# Patient Record
Sex: Female | Born: 1965 | Race: Black or African American | Hispanic: Yes | Marital: Single | Smoking: Never smoker
Health system: Southern US, Community
[De-identification: ages and names within clinical notes are randomized; demographics above are authoritative.]

## PROBLEM LIST (undated history)

## (undated) DIAGNOSIS — R079 Chest pain, unspecified: Secondary | ICD-10-CM

## (undated) DIAGNOSIS — E7800 Pure hypercholesterolemia, unspecified: Secondary | ICD-10-CM

## (undated) DIAGNOSIS — I493 Ventricular premature depolarization: Secondary | ICD-10-CM

## (undated) DIAGNOSIS — G8929 Other chronic pain: Secondary | ICD-10-CM

## (undated) DIAGNOSIS — F32A Depression, unspecified: Secondary | ICD-10-CM

## (undated) DIAGNOSIS — J45909 Unspecified asthma, uncomplicated: Secondary | ICD-10-CM

## (undated) DIAGNOSIS — J3089 Other allergic rhinitis: Secondary | ICD-10-CM

## (undated) DIAGNOSIS — E785 Hyperlipidemia, unspecified: Secondary | ICD-10-CM

## (undated) DIAGNOSIS — G894 Chronic pain syndrome: Secondary | ICD-10-CM

## (undated) HISTORY — DX: Chest pain, unspecified: R07.9

## (undated) HISTORY — PX: TUBAL LIGATION: SHX77

## (undated) HISTORY — DX: Depression, unspecified: F32.A

## (undated) HISTORY — DX: Ventricular premature depolarization: I49.3

## (undated) HISTORY — DX: Chronic pain syndrome: G89.4

## (undated) HISTORY — DX: Other allergic rhinitis: J30.89

---

## 2004-12-01 ENCOUNTER — Emergency Department (HOSPITAL_COMMUNITY): Admission: EM | Admit: 2004-12-01 | Discharge: 2004-12-02 | Payer: Self-pay | Admitting: Emergency Medicine

## 2005-01-15 ENCOUNTER — Inpatient Hospital Stay (HOSPITAL_COMMUNITY): Admission: AD | Admit: 2005-01-15 | Discharge: 2005-01-15 | Payer: Self-pay | Admitting: Obstetrics

## 2005-01-23 ENCOUNTER — Inpatient Hospital Stay (HOSPITAL_COMMUNITY): Admission: AD | Admit: 2005-01-23 | Discharge: 2005-01-23 | Payer: Self-pay | Admitting: Obstetrics

## 2005-05-26 ENCOUNTER — Inpatient Hospital Stay (HOSPITAL_COMMUNITY): Admission: AD | Admit: 2005-05-26 | Discharge: 2005-05-26 | Payer: Self-pay | Admitting: Obstetrics

## 2005-07-19 ENCOUNTER — Inpatient Hospital Stay (HOSPITAL_COMMUNITY): Admission: AD | Admit: 2005-07-19 | Discharge: 2005-07-21 | Payer: Self-pay | Admitting: Obstetrics

## 2005-07-20 ENCOUNTER — Encounter (INDEPENDENT_AMBULATORY_CARE_PROVIDER_SITE_OTHER): Payer: Self-pay | Admitting: Specialist

## 2005-07-20 HISTORY — PX: TUBAL LIGATION: SHX77

## 2005-11-14 ENCOUNTER — Emergency Department (HOSPITAL_COMMUNITY): Admission: EM | Admit: 2005-11-14 | Discharge: 2005-11-14 | Payer: Self-pay | Admitting: Emergency Medicine

## 2007-11-23 ENCOUNTER — Emergency Department (HOSPITAL_COMMUNITY): Admission: EM | Admit: 2007-11-23 | Discharge: 2007-11-24 | Payer: Self-pay | Admitting: Emergency Medicine

## 2009-07-16 ENCOUNTER — Emergency Department (HOSPITAL_COMMUNITY): Admission: EM | Admit: 2009-07-16 | Discharge: 2009-07-17 | Payer: Self-pay | Admitting: Emergency Medicine

## 2010-08-14 ENCOUNTER — Other Ambulatory Visit (HOSPITAL_COMMUNITY): Payer: Self-pay | Admitting: Family Medicine

## 2010-08-14 ENCOUNTER — Other Ambulatory Visit (HOSPITAL_BASED_OUTPATIENT_CLINIC_OR_DEPARTMENT_OTHER): Payer: Self-pay | Admitting: Family Medicine

## 2010-08-14 DIAGNOSIS — Z1231 Encounter for screening mammogram for malignant neoplasm of breast: Secondary | ICD-10-CM

## 2010-08-21 ENCOUNTER — Ambulatory Visit (HOSPITAL_COMMUNITY)
Admission: RE | Admit: 2010-08-21 | Discharge: 2010-08-21 | Disposition: A | Discharge status: Home / Self Care | Payer: Self-pay | Source: Ambulatory Visit | Attending: Family Medicine | Admitting: Family Medicine

## 2010-08-21 DIAGNOSIS — Z1231 Encounter for screening mammogram for malignant neoplasm of breast: Secondary | ICD-10-CM | POA: Insufficient documentation

## 2010-10-31 NOTE — Op Note (Signed)
NAME:  Felicia Ray            ACCOUNT NO.:  [id number]   MEDICAL RECORD NO.:  [id number]          PATIENT TYPE:  INP   LOCATION:  9148                          FACILITY:  [facility]   PHYSICIAN:  Kathreen Cosier, M.D.DATE OF BIRTH:  May 18, 1966   DATE OF PROCEDURE:  07/20/2005  DATE OF DISCHARGE:                                 OPERATIVE REPORT   PROCEDURE:  Multiparity.   PROCEDURE:  Postpartum tubal ligation.   Under general anesthesia, patient in supine position, abdomen prepped and  draped, bladder emptied by a straight catheter.  A midline subumbilical  incision one inch long was made, carried down to the fascia, the fascia  cleaned, grasped with two Kochers, and the fascia and the peritoneum opened  with the Mayo scissors.  The left tube was grasped in the midportion with a  Babcock clamp and the tube traced to the fimbriae.  A 0 plain suture was  placed in the mesosalpinx below the portion of tube in the clamp.  This was  tied and then approximately one inch of tube transected.  Hemostasis was  satisfactory.  On the right side the tube was bound down with adhesions and  the fimbriae were not identified.  However, the tube was identified to its  entrance into the uterus.  The tube was grasped with a Kelly clamp and a 0  plain tie placed around the portion of the tube that had been clamped and a  0 plain suture placed on the mesosalpinx below the portion of tube in the  clamp, and approximately one inch of tube transected.  Hemostasis was  satisfactory.  Lap and sponge count was correct.  Abdomen closed in layers,  the peritoneum and fascia with a continuous suture of 0 Dexon, the skin  closed with subcuticular suture of 4-0 Monocryl.  Blood loss minimal.  The  patient tolerated the procedure well, taken to the recovery room in good  condition.           ______________________________  Kathreen Cosier, M.D.     BAM/MEDQ  D:  07/20/2005  T:  07/20/2005  Job:   [id number]

## 2011-07-30 ENCOUNTER — Encounter (HOSPITAL_COMMUNITY): Payer: Self-pay | Admitting: *Deleted

## 2011-07-30 ENCOUNTER — Observation Stay (HOSPITAL_COMMUNITY)
Admission: EM | Admit: 2011-07-30 | Discharge: 2011-07-31 | Disposition: A | Discharge status: Home / Self Care | Payer: Self-pay | Attending: Emergency Medicine | Admitting: Emergency Medicine

## 2011-07-30 DIAGNOSIS — T782XXA Anaphylactic shock, unspecified, initial encounter: Secondary | ICD-10-CM

## 2011-07-30 DIAGNOSIS — T7800XA Anaphylactic reaction due to unspecified food, initial encounter: Principal | ICD-10-CM | POA: Insufficient documentation

## 2011-07-30 DIAGNOSIS — T7840XA Allergy, unspecified, initial encounter: Secondary | ICD-10-CM

## 2011-07-30 HISTORY — DX: Pure hypercholesterolemia, unspecified: E78.00

## 2011-07-30 MED ORDER — ONDANSETRON HCL 4 MG/2ML IJ SOLN
4.0000 mg | Freq: Once | INTRAMUSCULAR | Status: AC
  Administered 2011-07-30: 4 mg via INTRAVENOUS
  Filled 2011-07-30: qty 2

## 2011-07-30 MED ORDER — METHYLPREDNISOLONE SODIUM SUCC 125 MG IJ SOLR
125.0000 mg | Freq: Once | INTRAMUSCULAR | Status: AC
  Administered 2011-07-30: 125 mg via INTRAVENOUS
  Filled 2011-07-30: qty 2

## 2011-07-30 MED ORDER — EPINEPHRINE 0.3 MG/0.3ML IJ DEVI
0.3000 mg | Freq: Once | INTRAMUSCULAR | Status: AC
  Administered 2011-07-30: 0.3 mg via INTRAMUSCULAR

## 2011-07-30 MED ORDER — SODIUM CHLORIDE 0.9 % IV BOLUS (SEPSIS)
1000.0000 mL | Freq: Once | INTRAVENOUS | Status: AC
  Administered 2011-07-30: 1000 mL via INTRAVENOUS

## 2011-07-30 MED ORDER — RANITIDINE HCL 150 MG/10ML PO SYRP
150.0000 mg | ORAL_SOLUTION | Freq: Once | ORAL | Status: DC
  Filled 2011-07-30: qty 10

## 2011-07-30 MED ORDER — EPINEPHRINE 0.3 MG/0.3ML IJ DEVI
INTRAMUSCULAR | Status: AC
  Filled 2011-07-30: qty 0.3

## 2011-07-30 MED ORDER — FAMOTIDINE IN NACL 20-0.9 MG/50ML-% IV SOLN
20.0000 mg | Freq: Once | INTRAVENOUS | Status: AC
  Administered 2011-07-30: 20 mg via INTRAVENOUS
  Filled 2011-07-30: qty 50

## 2011-07-30 NOTE — ED Notes (Signed)
Friend explained to the patient that she was going to stay in the hospital tonight for observation.

## 2011-07-30 NOTE — ED Notes (Signed)
Patient ate at [employer] pizza about 7pm  Approx 730pm she started having difficulty breathing, breaking out in a rash.  Friend brought her into the hospital

## 2011-07-30 NOTE — ED Notes (Signed)
Patient states she is feeling better only tired.  Explained that she could be tired due to the Benadryl 50mg  taken at home.  Rash has decreased

## 2011-07-30 NOTE — ED Notes (Signed)
Unable to triage pt. Due to vomiting.  Pt st's she ate pizza then started having diff. Breathing and rash.  Charge nurse Barbara Cower, RN made aware, pt taken to room 4.

## 2011-07-30 NOTE — ED Provider Notes (Signed)
History     CSN: [id number]  Arrival date & time 07/30/11  2102   None     Chief Complaint  Patient presents with  . Allergic Reaction    (Consider location/radiation/quality/duration/timing/severity/associated sxs/prior treatment) HPI Hx provided by the pt.  Arrived via POV.  46 y.o. F presenting with allergic rxn.  Pt reportedly ate at [employer] around 7pm and developed a relatively acute rash around 7:30pm which is diffuse, red, and min pruritis.  Pt also with associated N/V.  Similar Sx's with ASA ingestion and unknown other food ingestion.  Pt took benadryl 50mg  PO PTA.  No associated SOB, wheezing, or swelling of face/lips/tongue/throat.  Sx's described as constant and moderate to severe.  Pt has not been seen recently for his current complaints.    No past medical history on file.  No past surgical history on file.  No family history on file.  History  Substance Use Topics  . Smoking status: Not on file  . Smokeless tobacco: Not on file  . Alcohol Use: Not on file    OB History    No data available      Review of Systems  Constitutional: Negative for fever and chills.  HENT: Negative for congestion, sore throat and rhinorrhea.   Eyes: Negative for pain and visual disturbance.  Respiratory: Negative for cough, shortness of breath and wheezing.   Cardiovascular: Negative for chest pain and palpitations.  Gastrointestinal: Positive for nausea and vomiting. Negative for abdominal pain, diarrhea and blood in stool.  Genitourinary: Negative for dysuria and hematuria.  Musculoskeletal: Negative for back pain and gait problem.  Skin: Positive for rash. Negative for wound.  Neurological: Negative for dizziness and headaches.  Psychiatric/Behavioral: Negative for confusion and agitation.  All other systems reviewed and are negative.    Allergies  Aspirin and Tylenol  Home Medications   Current Outpatient Rx  Name Route Sig Dispense Refill  .  DIPHENHYDRAMINE HCL 25 MG PO TABS Oral Take 50 mg by mouth every 6 (six) hours as needed. For allergic reaction    . OMEGA-3 FATTY ACIDS 1000 MG PO CAPS Oral Take 1 g by mouth 2 (two) times daily.    Marland Kitchen NIACIN 500 MG PO TABS Oral Take 500 mg by mouth daily with breakfast.      There were no vitals taken for this visit.  Physical Exam  Nursing note and vitals reviewed. Constitutional: She is oriented to person, place, and time. She appears well-developed and well-nourished. She appears distressed (mildly anxious).       Vomitus in basin on exam  HENT:  Head: Normocephalic and atraumatic.  Right Ear: External ear normal.  Left Ear: External ear normal.  Nose: Nose normal.  Mouth/Throat: Oropharynx is clear and moist.  Eyes: Conjunctivae and EOM are normal. Pupils are equal, round, and reactive to light.  Neck: Normal range of motion. Neck supple.  Cardiovascular: Regular rhythm and intact distal pulses.   No murmur heard.      Borderline tachy  Pulmonary/Chest: Effort normal and breath sounds normal. No respiratory distress.  Abdominal: Soft. Bowel sounds are normal. There is no tenderness.  Musculoskeletal: Normal range of motion. She exhibits no edema.  Neurological: She is alert and oriented to person, place, and time.  Skin: Skin is warm and dry. Rash (diffuse erythematous rash) noted. She is not diaphoretic.  Psychiatric: Judgment normal. Her mood appears anxious.    ED Course  Procedures (including critical care time)  Labs Reviewed -  No data to display No results found.   1. Allergic reaction   2. Anaphylaxis       MDM  46 y.o. F with allergic rxn after eating at [employer] with diffuse rash and N/V c/f anaphylaxis without airway/breathing involvement.  Benadryl taken PTA.  Epi IM, pepsid, zofran, and solumedrol ordered with improved Sx's.  Pt placed in CDU obs with plan for d/c with epi pen after 6-8 hr obs.    Care transferred to Dr. Bebe Shaggy.        Particia Lather,  MD 07/31/11 [phone]

## 2011-07-30 NOTE — ED Provider Notes (Signed)
46 year old, female, presents to emergency department after she had an allergic reaction: Being eats and this evening.  She has had pizza in the past, never had a similar reaction to eating pizza, however, she's had similar allergic reactions in the past.  Her reaction was characterized by diffuse, erythematous rash with abdominal pain, that she describes as burning, as well as nausea and vomiting.  Following treatment in the emergency department.  She still has moderate amount of erythema, and a burning sensation in her abdomen.  Presently, her lungs are clear to auscultation and she is not in respiratory distress.  We will continue to monitor her for several hours for possibility of recurrent anaphylactic reaction.  Nicholes Stairs, MD 07/30/11 [phone]

## 2011-07-31 MED ORDER — PREDNISONE 50 MG PO TABS: ORAL_TABLET | ORAL | Status: AC

## 2011-07-31 MED ORDER — EPINEPHRINE 0.3 MG/0.3ML IJ DEVI: 0.3000 mg | Freq: Once | INTRAMUSCULAR | Status: DC

## 2011-07-31 NOTE — ED Notes (Signed)
Patient up and to restroom. Waiting discharge.

## 2011-07-31 NOTE — ED Provider Notes (Signed)
Pt seen in the AM, well appearing, no distress Lungs sounds clear No angioedema She is sleeping/resting comfortably Stable for d/c  Joya Gaskins, MD 07/31/11 [phone]

## 2011-07-31 NOTE — ED Provider Notes (Signed)
I saw and evaluated the patient, reviewed the resident's note and I agree with the findings and plan.  Ozelle Brubacher P Mihika Surrette, MD 07/31/11 1514 

## 2011-07-31 NOTE — ED Notes (Signed)
Ordered regular bfast per nurse

## 2011-07-31 NOTE — Discharge Instructions (Signed)
Reaccin anafilctica (Anaphylactic Reaction) La reaccin anafilctica es una reaccin alrgica grave. La causa pueden ser ciertos medicamentos, alimentos, picaduras de insectos u otras sustancias habituales. No puede contagiarse de Neomia Dear persona a [place]. Puede poner en peligro la vida y requiere hospitalizacin.  SNTOMAS Algunos de los sntomas pueden ser:  Erupcin cutnea o urticaria.   Picazn   Opresin en el pecho.   Inchazn (que [employer] ojos, labios, [place]).   Problemas para respirar o tragar.   Mareos o Health Net de [place] y vmitos.  Los sntomas pueden desaparecer gradualmente cuando haya eliminado la sustancia que caus el problema. En algunos casos es necesario que transcurran dos o [place] para que los sntomas desaparezcan completamente. INSTRUCCIONES PARA EL CUIDADO DOMICILIARIO  Use un brazalete o lleve una tarjeta que enumere las cosas que le han causado una reaccin anafilctica o una reaccin alrgica menos grave.   Si en el pasado tuvo problemas con uno o ms medicamentos, debe evitarlos en el futuro, ya sean los mismos o medicamentos similares.   Hable con su mdico antes de utilizar nuevos medicamentos de venta libre o prescriptos.   Si presenta sarpullido o erupcin cutnea:   Aplquese compresas fras sobre la piel o tome baos de agua fra para Associate Professor.   Evite los baos o las duchas calientes. Esto puede hacer que la picazn empeore.   Use ropas sueltas.   Si ha sufrido Runner, broadcasting/film/video grave en el pasado:   Lleve con usted un kit anafilctico o epi-pen en todo momento. Tanto usted [employer] de su familia deben aprender a Chemical engineer los medicamentos del kit. Esto podr salvarle la vida si tiene una reaccin grave. Si [employer], y los sntomas mejoran, es importante que busque atencin mdica de inmediato o llame al servicio de urgencias de su localidad (911 en los [place]).   Los sntomas graves pueden  aparecer nuevamente cuando elimine los medicamentos que le han aplicado del kit.   Si no fue necesaria la hospitalizacin, debe tener rpido acceso a un servicio de emergencias por si el problema aparece nuevamente. Si un familiar o amigo ha aprendido a Oceanographer del kit anafilctico y Insurance claims handler con usted, tambin podr administrrselos si el problema ocurre nuevamente.   Asegrese de renovar el kit anafilctico cuando haya pasado la fecha de vencimiento.   Puede retornar a sus actividades normales una vez que los sntomas de la alergia hayan desaparecido.  BUSQUE ATENCIN MDICA SI:  Tiene sntomas de reaccin alrgica a una nueva sustancia. Los sntomas pueden aparecer inmediatamente o minutos despus.   Aparece una erupcin cutnea, picazn o sarpullido.   Tiene sntomas diferentes a los que sufri previamente.  SOLICITE ATENCIN MDICA DE INMEDIATO SI:   Presenta dificultad para respirar, jadea o tiene una sensacin de opresin en el pecho o en la garganta.   Se le hincha la boca o la [place], parte del cuerpo o tiene picazn generalizada.   Dolor de Teaching laboratory technician y vmitos.   Se marea o pierde el conocimiento   Siente dolor en el peco o empeoran los problemas enumerados. ESTO ES UNA EMERGENCIA. Llame al Clarita Leber de emergencias (911 en [place]).  ASEGURESE QUE:   Comprende estas instrucciones.   Controlar su enfermedad.   Solicitar ayuda de inmediato si no mejora o si empeora.  Document Released: 06/01/2005 Document Revised: 02/11/2011 [facility] Patient Information 2012 [place], [place].Reaccin anafilctica (Anaphylactic Reaction) La reaccin anafilctica es una reaccin alrgica  grave. La causa pueden ser ciertos medicamentos, alimentos, picaduras de insectos u otras sustancias habituales. No puede contagiarse de Neomia Dear persona a [place]. Puede poner en peligro la vida y requiere hospitalizacin.  SNTOMAS Algunos de los sntomas pueden ser:  Erupcin  cutnea o urticaria.   Picazn   Opresin en el pecho.   Inchazn (que [employer] ojos, labios, [place]).   Problemas para respirar o tragar.   Mareos o Health Net de [place] y vmitos.  Los sntomas pueden desaparecer gradualmente cuando haya eliminado la sustancia que caus el problema. En algunos casos es necesario que transcurran dos o [place] para que los sntomas desaparezcan completamente. INSTRUCCIONES PARA EL CUIDADO DOMICILIARIO  Use un brazalete o lleve una tarjeta que enumere las cosas que le han causado una reaccin anafilctica o una reaccin alrgica menos grave.   Si en el pasado tuvo problemas con uno o ms medicamentos, debe evitarlos en el futuro, ya sean los mismos o medicamentos similares.   Hable con su mdico antes de utilizar nuevos medicamentos de venta libre o prescriptos.   Si presenta sarpullido o erupcin cutnea:   Aplquese compresas fras sobre la piel o tome baos de agua fra para Associate Professor.   Evite los baos o las duchas calientes. Esto puede hacer que la picazn empeore.   Use ropas sueltas.   Si ha sufrido Runner, broadcasting/film/video grave en el pasado:   Lleve con usted un kit anafilctico o epi-pen en todo momento. Tanto usted [employer] de su familia deben aprender a Chemical engineer los medicamentos del kit. Esto podr salvarle la vida si tiene una reaccin grave. Si [employer], y los sntomas mejoran, es importante que busque atencin mdica de inmediato o llame al servicio de urgencias de su localidad (911 en los [place]).   Los sntomas graves pueden aparecer nuevamente cuando elimine los medicamentos que le han aplicado del kit.   Si no fue necesaria la hospitalizacin, debe tener rpido acceso a un servicio de emergencias por si el problema aparece nuevamente. Si un familiar o amigo ha aprendido a Oceanographer del kit anafilctico y Insurance claims handler con usted, tambin podr administrrselos si el  problema ocurre nuevamente.   Asegrese de renovar el kit anafilctico cuando haya pasado la fecha de vencimiento.   Puede retornar a sus actividades normales una vez que los sntomas de la alergia hayan desaparecido.  BUSQUE ATENCIN MDICA SI:  Tiene sntomas de reaccin alrgica a una nueva sustancia. Los sntomas pueden aparecer inmediatamente o minutos despus.   Aparece una erupcin cutnea, picazn o sarpullido.   Tiene sntomas diferentes a los que sufri previamente.  SOLICITE ATENCIN MDICA DE INMEDIATO SI:   Presenta dificultad para respirar, jadea o tiene una sensacin de opresin en el pecho o en la garganta.   Se le hincha la boca o la [place], parte del cuerpo o tiene picazn generalizada.   Dolor de Teaching laboratory technician y vmitos.   Se marea o pierde el conocimiento   Siente dolor en el peco o empeoran los problemas enumerados. ESTO ES UNA EMERGENCIA. Llame al Clarita Leber de emergencias (911 en [place]).  ASEGURESE QUE:   Comprende estas instrucciones.   Controlar su enfermedad.   Solicitar ayuda de inmediato si no mejora o si empeora.  Document Released: 06/01/2005 Document Revised: 02/11/2011 [facility] Patient Information 2012 [place], [place].

## 2012-03-04 ENCOUNTER — Emergency Department (HOSPITAL_COMMUNITY)
Admission: EM | Admit: 2012-03-04 | Discharge: 2012-03-04 | Disposition: A | Discharge status: Home / Self Care | Payer: Self-pay | Attending: Emergency Medicine | Admitting: Emergency Medicine

## 2012-03-04 DIAGNOSIS — R21 Rash and other nonspecific skin eruption: Secondary | ICD-10-CM | POA: Insufficient documentation

## 2012-03-04 DIAGNOSIS — T50995A Adverse effect of other drugs, medicaments and biological substances, initial encounter: Secondary | ICD-10-CM | POA: Insufficient documentation

## 2012-03-04 DIAGNOSIS — T782XXA Anaphylactic shock, unspecified, initial encounter: Secondary | ICD-10-CM | POA: Insufficient documentation

## 2012-03-04 LAB — CBC WITH DIFFERENTIAL/PLATELET
Eosinophils Absolute: 0 10*3/uL (ref 0.0–0.7)
Eosinophils Relative: 0 % (ref 0–5)
HCT: 43.2 % (ref 36.0–46.0)
Hemoglobin: 15.2 g/dL — ABNORMAL HIGH (ref 12.0–15.0)
Lymphocytes Relative: 5 % — ABNORMAL LOW (ref 12–46)
Lymphs Abs: 0.9 10*3/uL (ref 0.7–4.0)
MCH: 30.5 pg (ref 26.0–34.0)
MCV: 86.7 fL (ref 78.0–100.0)
Monocytes Absolute: 0.2 10*3/uL (ref 0.1–1.0)
Monocytes Relative: 1 % — ABNORMAL LOW (ref 3–12)
Platelets: 215 10*3/uL (ref 150–400)
RBC: 4.98 MIL/uL (ref 3.87–5.11)
WBC: 16.5 10*3/uL — ABNORMAL HIGH (ref 4.0–10.5)

## 2012-03-04 LAB — BASIC METABOLIC PANEL
BUN: 14 mg/dL (ref 6–23)
CO2: 20 mEq/L (ref 19–32)
GFR calc non Af Amer: >90 mL/min (ref >90)
Glucose, Bld: 151 mg/dL — ABNORMAL HIGH (ref 70–99)
Potassium: 3.3 mEq/L — ABNORMAL LOW (ref 3.5–5.1)
Sodium: 140 mEq/L (ref 135–145)

## 2012-03-04 LAB — URINALYSIS, ROUTINE W REFLEX MICROSCOPIC
Bilirubin Urine: NEGATIVE
Glucose, UA: NEGATIVE mg/dL
Hgb urine dipstick: NEGATIVE
Specific Gravity, Urine: 1.006 (ref 1.005–1.030)
Urobilinogen, UA: 0.2 mg/dL (ref 0.0–1.0)

## 2012-03-04 MED ORDER — METHYLPREDNISOLONE SODIUM SUCC 125 MG IJ SOLR
INTRAMUSCULAR | Status: AC
  Filled 2012-03-04: qty 2

## 2012-03-04 MED ORDER — POTASSIUM CHLORIDE CRYS ER 20 MEQ PO TBCR
40.0000 meq | EXTENDED_RELEASE_TABLET | Freq: Once | ORAL | Status: AC
  Administered 2012-03-04: 40 meq via ORAL
  Filled 2012-03-04: qty 2

## 2012-03-04 MED ORDER — METHYLPREDNISOLONE SODIUM SUCC 125 MG IJ SOLR
125.0000 mg | Freq: Once | INTRAMUSCULAR | Status: AC
  Administered 2012-03-04: 125 mg via INTRAVENOUS

## 2012-03-04 MED ORDER — SODIUM CHLORIDE 0.9 % IV BOLUS (SEPSIS)
1000.0000 mL | Freq: Once | INTRAVENOUS | Status: AC
  Administered 2012-03-04: 1000 mL via INTRAVENOUS

## 2012-03-04 MED ORDER — EPINEPHRINE 0.3 MG/0.3ML IJ DEVI: 0.3000 mg | INTRAMUSCULAR | Status: DC | PRN

## 2012-03-04 MED ORDER — EPINEPHRINE 0.3 MG/0.3ML IJ DEVI
INTRAMUSCULAR | Status: AC
  Filled 2012-03-04: qty 0.3

## 2012-03-04 MED ORDER — DIPHENHYDRAMINE HCL 50 MG/ML IJ SOLN
50.0000 mg | Freq: Once | INTRAMUSCULAR | Status: AC
  Administered 2012-03-04: 50 mg via INTRAVENOUS

## 2012-03-04 MED ORDER — EPINEPHRINE 0.3 MG/0.3ML IJ DEVI
0.3000 mg | Freq: Once | INTRAMUSCULAR | Status: AC
  Administered 2012-03-04: 0.3 mg via INTRAMUSCULAR

## 2012-03-04 MED ORDER — FAMOTIDINE IN NACL 20-0.9 MG/50ML-% IV SOLN
20.0000 mg | Freq: Once | INTRAVENOUS | Status: AC
  Administered 2012-03-04: 20 mg via INTRAVENOUS
  Filled 2012-03-04: qty 50

## 2012-03-04 MED ORDER — DIPHENHYDRAMINE HCL 50 MG/ML IJ SOLN
INTRAMUSCULAR | Status: AC
  Filled 2012-03-04: qty 1

## 2012-03-04 NOTE — ED Notes (Signed)
Pt denies any pain, pt and family denies any questions upon discharge.

## 2012-03-04 NOTE — ED Provider Notes (Signed)
Received sign out from Dr. Rennis Chris at 5pm. She came for severe allergic reaction today. She was given epi and observed for 7 hours. She was hypotensive initially after epi but her BP at discharge was 101/67. Her labs showed WBC 16, nl BMP, nl UA. Elevated WBC likely stress related as she remains afebrile and no signs of infection. She felt well after 7 hours and rash resolved and no respiratory symptoms. Discharge instruction given and I gave her another epi pen.   Richardean Canal, MD 03/04/12 2146

## 2012-03-04 NOTE — ED Notes (Signed)
Brought to ED via POV for eval of SOB and redness to body since eating beef per family member. Resp distress noted. Pt brought straight to trauma room. ED staff and EDP in for eval.

## 2012-03-04 NOTE — ED Provider Notes (Signed)
History     CSN: [id number]  Arrival date & time 03/04/12  1434   First MD Initiated Contact with Patient 03/04/12 1450      Chief Complaint  Patient presents with  . Allergic Reaction    (Consider location/radiation/quality/duration/timing/severity/associated sxs/prior treatment) HPI Seen on arrival. Level V caveat unstable vital signs. History is obtained from husband. Patient developed shortness of breath and diffuse redness to her body after takingFlanex for back pain earlier today. The treatment prior to coming here no other complaint No past medical history on file. Multiple allergic reactions No past surgical history on file.  No family history on file.  History  Substance Use Topics  . Smoking status: Not on file  . Smokeless tobacco: Not on file  . Alcohol Use: Not on file   No tobacco no alcohol no drugsOB History    No data available      Review of Systems  Unable to perform ROS: Unstable vital signs  Respiratory: Positive for shortness of breath.   Skin: Positive for rash.    Allergies  Other  Home Medications  No current outpatient prescriptions on file.  BP 100/58  Pulse 74  Temp 97.3 F (36.3 C) (Oral)  Resp 23  SpO2 97%  Physical Exam  Nursing note and vitals reviewed. Constitutional: She appears well-developed and well-nourished. She appears distressed.       Appears uncomfortable, air hungry  HENT:  Head: Normocephalic and atraumatic.       No mucosal lesion  Eyes: Conjunctivae normal are normal. Pupils are equal, round, and reactive to light.  Neck: Neck supple. No tracheal deviation present. No thyromegaly present.  Cardiovascular: Normal rate and regular rhythm.   No murmur heard. Pulmonary/Chest: Breath sounds normal.       Tachypnea  Abdominal: Soft. Bowel sounds are normal. She exhibits no distension. There is no tenderness.  Musculoskeletal: Normal range of motion. She exhibits no edema and no tenderness.  Neurological: She  is alert. Coordination normal.  Skin: Skin is warm and dry. Rash noted.       Diffuse urticarial rash  Psychiatric: She has a normal mood and affect.    ED Course  Procedures (including critical care time)  Labs Reviewed - No data to display No results found. Received epinephrine 0.3 mg IM, Solu-Medrol 125 mg IV and Benadryl 50 mg IV, ordered immediately upon arrival. At 3:10 PM patient appeared to be dyspneic and noted be hypotensive with blood pressure 85/50. A saline 2 L intravenous bolus ordered by me At 3:45 PM she feels much improved. Further history is obtained from [employer] using medical interpreter. Complains of a "heavy feeling in her left arm denies any chest pain. on exam rash has resolved patient is alert speaks in paragraphs is in no distress motor strength 5 over 5 overall No diagnosis found.   Date: 03/04/2012  1436  Rate: 80  Rhythm: normal sinus rhythm  QRS Axis: normal  Intervals: normal  ST/T Wave abnormalities: nonspecific T wave changes  Conduction Disutrbances:none  Narrative Interpretation:   Old EKG Reviewed: none available    Date: 03/04/2012  14:41  Rate: 80  Rhythm: normal sinus rhythm  QRS Axis: normal  Intervals: normal  ST/T Wave abnormalities: nonspecific T wave changes  Conduction Disutrbances:none  Narrative Interpretation:   Old EKG Reviewed: unchanged and    MDM  Patient felt to have allergic reaction to medication. Told to stop"flanex". She should go home with an EpiPen is  stable for discharge Signed out to   Yao at 445 pm Diagnosis anaphylactic reaction        Doug Sou, MD 03/04/12 1656

## 2012-03-04 NOTE — ED Notes (Signed)
Pt given meal tray and Po fluids per MD.

## 2012-03-04 NOTE — ED Notes (Signed)
Pt resting quietly with family at bedside, pt denies any distress. Resp e/u, skin w/d, pt is asymptomatic hypotensive. Plan of care is updated with verbal understanding and will continue to monitor pt.

## 2014-01-23 ENCOUNTER — Emergency Department (HOSPITAL_COMMUNITY)
Admission: EM | Admit: 2014-01-23 | Discharge: 2014-01-24 | Disposition: A | Discharge status: Home / Self Care | Payer: Worker's Compensation | Attending: Emergency Medicine | Admitting: Emergency Medicine

## 2014-01-23 ENCOUNTER — Encounter (HOSPITAL_COMMUNITY): Payer: Self-pay | Admitting: Emergency Medicine

## 2014-01-23 DIAGNOSIS — S99929A Unspecified injury of unspecified foot, initial encounter: Secondary | ICD-10-CM

## 2014-01-23 DIAGNOSIS — X500XXA Overexertion from strenuous movement or load, initial encounter: Secondary | ICD-10-CM | POA: Diagnosis not present

## 2014-01-23 DIAGNOSIS — S79929A Unspecified injury of unspecified thigh, initial encounter: Secondary | ICD-10-CM | POA: Insufficient documentation

## 2014-01-23 DIAGNOSIS — S79919A Unspecified injury of unspecified hip, initial encounter: Secondary | ICD-10-CM | POA: Diagnosis not present

## 2014-01-23 DIAGNOSIS — IMO0002 Reserved for concepts with insufficient information to code with codable children: Secondary | ICD-10-CM | POA: Insufficient documentation

## 2014-01-23 DIAGNOSIS — Y9389 Activity, other specified: Secondary | ICD-10-CM | POA: Insufficient documentation

## 2014-01-23 DIAGNOSIS — S99919A Unspecified injury of unspecified ankle, initial encounter: Secondary | ICD-10-CM | POA: Diagnosis present

## 2014-01-23 DIAGNOSIS — M545 Low back pain, unspecified: Secondary | ICD-10-CM

## 2014-01-23 DIAGNOSIS — Z862 Personal history of diseases of the blood and blood-forming organs and certain disorders involving the immune mechanism: Secondary | ICD-10-CM | POA: Diagnosis not present

## 2014-01-23 DIAGNOSIS — M62830 Muscle spasm of back: Secondary | ICD-10-CM

## 2014-01-23 DIAGNOSIS — J45909 Unspecified asthma, uncomplicated: Secondary | ICD-10-CM | POA: Insufficient documentation

## 2014-01-23 DIAGNOSIS — W19XXXA Unspecified fall, initial encounter: Secondary | ICD-10-CM

## 2014-01-23 DIAGNOSIS — Z8639 Personal history of other endocrine, nutritional and metabolic disease: Secondary | ICD-10-CM | POA: Insufficient documentation

## 2014-01-23 DIAGNOSIS — S8392XA Sprain of unspecified site of left knee, initial encounter: Secondary | ICD-10-CM

## 2014-01-23 DIAGNOSIS — G894 Chronic pain syndrome: Secondary | ICD-10-CM

## 2014-01-23 DIAGNOSIS — S8990XA Unspecified injury of unspecified lower leg, initial encounter: Secondary | ICD-10-CM | POA: Insufficient documentation

## 2014-01-23 DIAGNOSIS — Y9289 Other specified places as the place of occurrence of the external cause: Secondary | ICD-10-CM | POA: Diagnosis not present

## 2014-01-23 HISTORY — DX: Chronic pain syndrome: G89.4

## 2014-01-23 NOTE — ED Notes (Signed)
[facility] Expected [facility]:  Expected time:  Means of arrival:  Comments: EMS 48yo F, fall at work, c/o back pain and left knee pain

## 2014-01-23 NOTE — ED Notes (Signed)
Pt presents from work([employer]) report of a work related fall. C/o of pain on the right hip and lower back, denies LOC or head injury. Pt received 50mcg Fentanyl and 4mg  zofran en route.

## 2014-01-24 ENCOUNTER — Emergency Department (HOSPITAL_COMMUNITY): Payer: Worker's Compensation

## 2014-01-24 MED ORDER — DIAZEPAM 5 MG PO TABS: 5.0000 mg | ORAL_TABLET | Freq: Four times a day (QID) | ORAL | Status: DC | PRN

## 2014-01-24 MED ORDER — DIAZEPAM 5 MG PO TABS
ORAL_TABLET | ORAL | Status: AC
  Filled 2014-01-24: qty 1

## 2014-01-24 MED ORDER — OXYCODONE HCL 5 MG PO TABS
5.0000 mg | ORAL_TABLET | Freq: Once | ORAL | Status: DC
  Filled 2014-01-24: qty 1

## 2014-01-24 MED ORDER — OXYCODONE HCL 5 MG PO TABS: 5.0000 mg | ORAL_TABLET | ORAL | Status: DC | PRN

## 2014-01-24 NOTE — ED Notes (Signed)
Bed: [facility] Expected date:  Expected time:  Means of arrival:  Comments: 

## 2014-01-24 NOTE — Discharge Instructions (Signed)
Your x-rays today do not show any broken bones or other concerning injury. Use rest, ice, compression and elevation to reduce pain and swelling in your knee. Followup with a primary care provider or an orthopedic specialist for continued evaluation and treatment.    Esguince de rodilla ( Knee Sprain) Un esguince de rodilla es un desgarro en uno de los tejidos fuertes y fibrosos (ligamentos) que conectan los huesos de la rodilla. La gravedad del esguince depende de cunto ligamento se rompe. La ruptura puede ser parcial o completa. CAUSAS  A menudo, los esguinces son el resultado de una cada o una lesin. La fuerza del impacto hace que las fibras del ligamento se estiren ms de su largo normal. Este exceso de tensin es la causa de que las fibras del ligamento se rompan. SIGNOS Y SNTOMAS  Es posible que pierda el movimiento de la rodilla. Otros sntomas son:  [place].  Dolor en la zona de la rodilla.  Sensibilidad de la rodilla al tacto.  Hinchazn. DIAGNSTICO  Para diagnosticar un esguince de rodilla, su mdico le har un examen fsico de la rodilla. Adems, puede indicarle que se haga una radiografa de la rodilla para asegurarse de que no haya huesos fracturados. TRATAMIENTO  Si el ligamento est parcialmente roto, el tratamiento, habitualmente, consiste en mantener la rodilla en una posicin fija (inmovilizacin) o en usar un soporte durante algunas semanas cuando realice actividades que requieran movimiento. Para ello, su mdico colocar un vendaje, un yeso o una frula para impedir que la rodilla se Armed forces technical officer y para que le brinde apoyo durante los movimientos hasta que esta se cure. En el caso de un ligamento parcialmente roto, el proceso de curacin generalmente demora de 4 a 6semanas. Si el ligamento est completamente roto, segn de qu ligamento se trate, podr necesitar una ciruga para volver a unirlo al hueso o para Copywriter, advertising. Despus de la ciruga, Musician un yeso o una  frula que no podr quitarse durante 4 a 6semanas mientras el ligamento se [place]. INSTRUCCIONES PARA EL CUIDADO EN EL HOGAR  Mantenga elevada la rodilla lesionada para disminuir la hinchazn.  Para aliviar el dolor y la hinchazn, aplique hielo en la zona de la lesin:  Ponga el hielo en una bolsa plstica.  Colquese una toalla entre la piel y la bolsa de hielo.  Deje el hielo durante 20 minutos, 2 a 3 veces por da.  Tome los analgsicos nicamente como le indic su mdico.  No deje la rodilla sin proteccin hasta que el dolor y la rigidez desaparezcan (generalmente en el trmino de 4 a 6semanas).  Si tiene puesto un yeso o una frula, no deje que se moje. Si le han indicado que no puede quitrselo, cbralo con una bolsa plstica para darse [place] ducha o un bao. No practique natacin.  Su mdico puede indicarle ejercicios para que haga durante la recuperacin, a fin de Agricultural engineer la debilidad y la rigidez permanentes. SOLICITE ATENCIN MDICA DE INMEDIATO SI:  El yeso o la frula se daan.  El dolor [place].  Tiene dolor intenso, hinchazn o adormecimiento debajo del yeso o la frula. ASEGRESE DE QUE:  Comprende estas instrucciones.  Controlar su afeccin.  Recibir ayuda de inmediato si no mejora o si empeora. Document Released: 06/01/2005 Document Revised: 03/22/2013 [facility] Patient Information 2015 [place], [place]. This information is not intended to replace advice given to you by your health care provider. Make sure you discuss any questions you have with your health care provider.  Dolor de espalda en el adulto (Back Pain, Adult)  El dolor de cintura es frecuente. Aproximadamente 1 de cada 5 personas lo sufren.La causa rara vez pone en peligro la vida. Con frecuencia mejora luego de algn tiempo.Alrededor de la mitad de las personas que sufren un inicio sbito de dolor de cintura, se sentirn mejor luego de 2 semanas. Aproximadamente 8 de cada 10 se sentirn  mejor luego de 6 semanas.  CAUSAS  Algunas causas comunes son:   Distensin de los msculos o ligamentos que sostienen la columna vertebral.  Desgaste (degeneracin) de los discos vertebrales.  Artritis.  Traumatismos directos en la espalda. DIAGNSTICO  La mayor parte de las veces, la causa directa no se conoce.Sin embargo, Chief Technology Officer puede tratarse efectivamente an cuando no se Best boy.Una de las formas ms precisas de asegurar que la causa del dolor no constituye un peligro es responder a las preguntas del mdico acerca de su salud y sus sntomas. Si el mdico necesita ms informacin, podr indicar anlisis de laboratorio o Education officer, environmental un diagnstico por imgenes (radiografas o Health visitor).Sin embargo, aunque las [employer] modificaciones, generalmente no es necesaria la [place].  INSTRUCCIONES PARA EL CUIDADO EN EL HOGAR  En algunas personas, el dolor de espalda vuelve.Como rara vez es peligroso, los pacientes pueden aprender a Interior and spatial designer.   Mantngase activo. Si permanece sentado o de pie mucho tiempo en el mismo lugar, se tensiona la espalda.  No se siente, maneje ni se quede parado en un mismo lugar por ms de 30 minutos. Realice caminatas cortas en superficies planas ni bien el dolor haya cedido. Trate de Copy tiempo que camina .  No se quede en la cama.Si hace reposo durante ms de 1 o 2 das, puede Estate agent.  No evite los ejercicios ni el trabajo.El cuerpo est hecho para moverse.No es peligroso estar [place], aunque le duela la espalda.La espalda se curar ms rpido si contina sus actividades antes de que el dolor se vaya.  Preste atencin a su cuerpo cuando se incline y se levante. Muchas personas sienten menos molestias cuando levantan objetos si doblan las rodillas, mantienen la carga cerca del cuerpo y evitan torcerse. Generalmente, las posiciones ms cmodas son las que ejercen menos tensin en la  espalda en recuperacin.  Encuentre una posicin cmoda para dormir. Utilice un colchn firme y recustese de [place]. Doble ligeramente sus rodillas. Si se recuesta sobre su espalda, coloque una almohada debajo de sus rodillas.  Tome slo medicamentos de venta libre o recetados, segn las indicaciones del mdico. Los medicamentos de venta libre para Primary school teacher y reducir Futures trader, son los que en general ms ayudan.El mdico podr prescribirle relajantes musculares.Estos medicamentos calman el dolor de modo que pueda retornar a sus actividades normales y a Investment banker, operational.  Aplique hielo sobre la zona lesionada.  Ponga el hielo en una bolsa plstica.  Colquese una toalla entre la piel y la bolsa de hielo.  Deje la bolsa de hielo durante 15 a 20 minutos 3 a 4 veces por da, durante los primeros 2  3 das. Luego podr alternar Eusebio Me calor y hielo para reducir Chief Technology Officer y los espasmos.  Consulte a su mdico si puede tratar de hacer ejercicios para la espalda y recibir un masaje suave. Pueden ser beneficiosos.  Evite sentirse ansioso o estresado.El estrs aumenta la tensin muscular y puede empeorar el dolor de espalda.Es importante reconocer cuando est ansioso o estresado y aprender la  forma de controlarlos.El ejercicio es una gran opcin. SOLICITE ATENCIN MDICA SI:   Siente un dolor que no se alivia con reposo o medicamentos.  El dolor no mejora en 1 semana.  Desarrolla nuevos sntomas.  No se siente bien en general. SOLICITE ATENCIN MDICA DE INMEDIATO SI:  Siente un dolor que se irradia desde la espalda hacia sus piernas.  Desarrolla nuevos problemas en el intestino o la vejiga.  Siente debilidad o adormecimiento inusual en sus brazos o piernas.  Presenta nuseas o vmitos.  Presenta dolor abdominal.  Se siente desfalleciente. Document Released: 06/01/2005 Document Revised: 12/01/2011 [facility] Patient Information 2015 [place]. This  information is not intended to replace advice given to you by your health care provider. Make sure you discuss any questions you have with your health care provider.

## 2014-01-24 NOTE — ED Notes (Signed)
See downtime forms for other information

## 2014-01-24 NOTE — ED Provider Notes (Signed)
Medical screening examination/treatment/procedure(s) were performed by non-physician practitioner and as supervising physician I was immediately available for consultation/collaboration.   Geraldo Haris, MD 01/24/14 0733 

## 2014-01-24 NOTE — ED Provider Notes (Signed)
CSN: [id number]     Arrival date & time 01/23/14  2257 History   First MD Initiated Contact with Patient 01/24/14 0016     Chief Complaint  Patient presents with  . Fall   HPI  History provided by the patient through a Spanish interpreter. Patient is a 48 year old Hispanic female with history of asthma and hypercholesterolemia presenting with pain and injuries after a fall at work. Patient states that she was walking at work when she suddenly twisted her left knee and felt some grinding and popping. This caused sudden pain and a fall. Patient landed onto her right hip. It since then she has had pain in her left knee, right hip and low back. She denies hitting her head or having LOC. She was transported by EMS and was given 50 mcg of fentanyl IV as well as 4 mg Zofran. Continues to have pain and discomfort.    Past Medical History  Diagnosis Date  . Asthma   . Hypercholesteremia    History reviewed. No pertinent past surgical history. History reviewed. No pertinent family history. History  Substance Use Topics  . Smoking status: Never Smoker   . Smokeless tobacco: Not on file  . Alcohol Use: No   OB History   Grav Para Term Preterm Abortions TAB SAB Ect Mult Living                 Review of Systems  Respiratory: Negative for shortness of breath.   Cardiovascular: Negative for chest pain.  Musculoskeletal: Positive for back pain. Negative for neck pain.  Neurological: Negative for dizziness, weakness, numbness and headaches.  All other systems reviewed and are negative.     Allergies  Aspirin and Tylenol  Home Medications   Prior to Admission medications   Medication Sig Start Date End Date Taking? Authorizing Provider  EPINEPHrine (EPIPEN) 0.3 mg/0.3 mL DEVI Inject 0.3 mLs (0.3 mg total) into the muscle once. 07/31/11   Amy Coker, MD   BP 104/75  Pulse 57  Temp(Src) 98.3 F (36.8 C) (Oral)  Resp 22  SpO2 98% Physical Exam  Nursing note and vitals  reviewed. Constitutional: She is oriented to person, place, and time. She appears well-developed and well-nourished. No distress.  HENT:  Head: Normocephalic and atraumatic.  Neck: Normal range of motion. Neck supple.  No cervical midline tenderness.  Cardiovascular: Normal rate and regular rhythm.   Pulmonary/Chest: Effort normal and breath sounds normal. No respiratory distress. She has no wheezes.  Abdominal: Soft. There is no tenderness.  Musculoskeletal: She exhibits edema and tenderness.  Mild swelling of the left knee. No gross deformity. There is reduced range of motion secondary to pain. Tenderness along the medial aspect of the knee and posteriorly near the hamstring tendons. These are palpated without signs of complete rupture. Normal distal pulses, sensations and strength in the foot.  There is also tenderness around the right hip and posterior iliac crest and low back areas. No gross deformities. Normal distal pulses sensation and strength in the foot and ankle.  Neurological: She is alert and oriented to person, place, and time.  Skin: Skin is warm and dry. No rash noted.  Psychiatric: She has a normal mood and affect. Her behavior is normal.    ED Course  Procedures   COORDINATION OF CARE:  Nursing notes reviewed. Vital signs reviewed. Initial pt interview and examination performed.   Filed Vitals:   01/23/14 2300  BP: 104/75  Pulse: 57  Temp: 98.3 F (  36.8 C)  TempSrc: Oral  Resp: 22  SpO2: 98%    12:17 AM-patient seen and evaluated. Currently resting in bed does not appear in severe pain or discomfort. X-rays ordered. Pain medication ordered.  Initial x-rays without signs of acute fractures or other concerning injuries.  Patient was given a left knee immobilizer for concerns of possible ligamentous injury. We'll also give crutches. Initially patient having sharp pain in her right low back upon trying to stand and use crutches. Valium ordered.  Will plan to  retry.  Patient doing much better after Valium. She is able to stand and use crutches. This time she is able to return home with orthopedic referral.  Treatment plan initiated: Medications  oxyCODONE (Oxy IR/ROXICODONE) immediate release tablet 5 mg (not administered)  diazepam (VALIUM) 5 MG tablet (not administered)         Imaging Review Dg Cervical Spine Complete  01/24/2014   CLINICAL DATA:  Status post fall.  Posterior neck pain.  EXAM: CERVICAL SPINE  4+ VIEWS  COMPARISON:  None.  FINDINGS: There is no evidence of fracture or subluxation. Vertebral bodies demonstrate normal height and alignment. Intervertebral disc spaces are preserved. Prevertebral soft tissues are within normal limits. The provided odontoid view demonstrates no significant abnormality.  The visualized lung apices are clear.  IMPRESSION: No evidence of fracture or subluxation along the cervical spine.   Electronically Signed   By: Roanna Raider M.D.   On: 01/24/2014 02:25   Dg Thoracic Spine 2 View  01/24/2014   CLINICAL DATA:  Fall.  EXAM: THORACIC SPINE - 2 VIEW  COMPARISON:  None.  FINDINGS: There is no evidence of thoracic spine fracture. Alignment is normal. Mild lower thoracic degenerative disc disease. No other significant bone abnormalities are identified.  IMPRESSION: No acute thoracic spine fracture deformity or malalignment.   Electronically Signed   By: Awilda Metro   On: 01/24/2014 04:58   Dg Lumbar Spine Complete  01/24/2014   CLINICAL DATA:  Fall.  EXAM: LUMBAR SPINE - COMPLETE 4+ VIEW  COMPARISON:  None.  FINDINGS: Five non rib-bearing lumbar-type vertebral bodies are intact and aligned with maintenance of the lumbar lordosis. Intervertebral disc heights are normal. No destructive bony lesions. No pars interarticularis defects. Severe lower lumbar facet arthropathy.  Sacroiliac joints are symmetric. Included prevertebral and paraspinal soft tissue planes are non-suspicious.  IMPRESSION: Severe  lower lumbar facet arthropathy without acute fracture deformity or malalignment.   Electronically Signed   By: Awilda Metro   On: 01/24/2014 05:00   Dg Hip Complete Right  01/24/2014   CLINICAL DATA:  Status post fall; right anterior hip pain.  EXAM: RIGHT HIP - COMPLETE 2+ VIEW  COMPARISON:  None.  FINDINGS: There is no evidence of fracture or dislocation. Both femoral heads are seated normally within their respective acetabula. The proximal right femur appears intact. No significant degenerative change is appreciated. Mild sclerotic change is noted at the sacroiliac joints.  The visualized bowel gas pattern is grossly unremarkable in appearance.  IMPRESSION: No evidence of fracture or dislocation.   Electronically Signed   By: Roanna Raider M.D.   On: 01/24/2014 02:27   Dg Knee Complete 4 Views Left  01/24/2014   CLINICAL DATA:  Status post fall; pain about the left patella, with limited range of motion.  EXAM: LEFT KNEE - COMPLETE 4+ VIEW  COMPARISON:  Left knee radiographs performed 11/14/2005  FINDINGS: There is no evidence of fracture or dislocation. The joint spaces are  preserved. No significant degenerative change is seen; the patellofemoral joint is grossly unremarkable in appearance.  No significant joint effusion is seen. The visualized soft tissues are normal in appearance.  IMPRESSION: No evidence of fracture or dislocation.   Electronically Signed   By: Roanna Raider M.D.   On: 01/24/2014 02:26    MDM   Final diagnoses:  Fall, initial encounter  Knee sprain, left, initial encounter  Right-sided low back pain without sciatica  Muscle spasm of back        Angus Seller, PA-C 01/24/14 [phone]

## 2014-03-09 ENCOUNTER — Ambulatory Visit: Payer: Self-pay

## 2014-03-22 ENCOUNTER — Telehealth: Payer: Self-pay | Admitting: *Deleted

## 2014-03-22 ENCOUNTER — Other Ambulatory Visit: Payer: Self-pay | Admitting: *Deleted

## 2014-03-22 DIAGNOSIS — N83201 Unspecified ovarian cyst, right side: Secondary | ICD-10-CM

## 2014-03-22 NOTE — Telephone Encounter (Signed)
Contacted patient with spanish interpreter, informed of ultrasound appointment on April 03, 2014 at 12:45pm, instructions given. Pt verbalizes understanding.

## 2014-04-03 ENCOUNTER — Ambulatory Visit (HOSPITAL_COMMUNITY)
Admission: RE | Admit: 2014-04-03 | Discharge: 2014-04-03 | Disposition: A | Discharge status: Home / Self Care | Payer: Self-pay | Source: Ambulatory Visit | Attending: Obstetrics & Gynecology | Admitting: Obstetrics & Gynecology

## 2014-04-03 DIAGNOSIS — N83201 Unspecified ovarian cyst, right side: Secondary | ICD-10-CM

## 2014-04-03 DIAGNOSIS — N8320 Unspecified ovarian cysts: Secondary | ICD-10-CM | POA: Insufficient documentation

## 2014-04-04 ENCOUNTER — Telehealth: Payer: Self-pay | Admitting: *Deleted

## 2014-04-04 NOTE — Telephone Encounter (Signed)
Message copied by Dorothyann PengHAIZLIP, Segundo Makela E on Wed Apr 04, 2014  1:03 PM ------      Message from: Jaynie CollinsANYANWU, UGONNA A      Created: Tue Apr 03, 2014  6:30 PM       Please call patient to inform her that her cyst has resolved. Normal ultrasound. No need for GYN clinic appointment. ------

## 2014-04-04 NOTE — Telephone Encounter (Signed)
Message copied by Jill SideAY, DIANE L on Wed Apr 04, 2014 11:52 AM ------      Message from: Jaynie CollinsANYANWU, UGONNA A      Created: Tue Apr 03, 2014  6:30 PM       Please call patient to inform her that her cyst has resolved. Normal ultrasound. No need for GYN clinic appointment. ------

## 2014-04-04 NOTE — Telephone Encounter (Signed)
Contacted patient with Felicia Ray interpreter, results given, pt verbalizes understanding.

## 2014-04-05 ENCOUNTER — Telehealth: Payer: Self-pay | Admitting: *Deleted

## 2014-04-05 NOTE — Telephone Encounter (Signed)
Dr. Delrae AlfredMulberry called to follow up her patients gyn referral.  She was aware ultrasound done, results given as requested- have signed release from patient allowing results to be released.

## 2014-04-27 ENCOUNTER — Emergency Department (HOSPITAL_COMMUNITY)
Admission: EM | Admit: 2014-04-27 | Discharge: 2014-04-27 | Disposition: A | Discharge status: Home / Self Care | Payer: Self-pay | Attending: Emergency Medicine | Admitting: Emergency Medicine

## 2014-04-27 ENCOUNTER — Encounter (HOSPITAL_COMMUNITY): Payer: Self-pay | Admitting: Family Medicine

## 2014-04-27 DIAGNOSIS — R6883 Chills (without fever): Secondary | ICD-10-CM | POA: Insufficient documentation

## 2014-04-27 DIAGNOSIS — Z3202 Encounter for pregnancy test, result negative: Secondary | ICD-10-CM | POA: Insufficient documentation

## 2014-04-27 DIAGNOSIS — Z8639 Personal history of other endocrine, nutritional and metabolic disease: Secondary | ICD-10-CM | POA: Insufficient documentation

## 2014-04-27 DIAGNOSIS — R112 Nausea with vomiting, unspecified: Secondary | ICD-10-CM

## 2014-04-27 DIAGNOSIS — R10812 Left upper quadrant abdominal tenderness: Secondary | ICD-10-CM | POA: Insufficient documentation

## 2014-04-27 DIAGNOSIS — R109 Unspecified abdominal pain: Secondary | ICD-10-CM

## 2014-04-27 DIAGNOSIS — Z79899 Other long term (current) drug therapy: Secondary | ICD-10-CM | POA: Insufficient documentation

## 2014-04-27 DIAGNOSIS — R197 Diarrhea, unspecified: Secondary | ICD-10-CM

## 2014-04-27 DIAGNOSIS — J45909 Unspecified asthma, uncomplicated: Secondary | ICD-10-CM | POA: Insufficient documentation

## 2014-04-27 LAB — CBC WITH DIFFERENTIAL/PLATELET
Basophils Absolute: 0 10*3/uL (ref 0.0–0.1)
Basophils Relative: 0 % (ref 0–1)
Eosinophils Absolute: 0.1 10*3/uL (ref 0.0–0.7)
Eosinophils Relative: 1 % (ref 0–5)
HCT: 42.8 % (ref 36.0–46.0)
HEMOGLOBIN: 14.5 g/dL (ref 12.0–15.0)
LYMPHS ABS: 0.7 10*3/uL (ref 0.7–4.0)
Lymphocytes Relative: 7 % — ABNORMAL LOW (ref 12–46)
MCH: 29.5 pg (ref 26.0–34.0)
MCHC: 33.9 g/dL (ref 30.0–36.0)
MCV: 87 fL (ref 78.0–100.0)
MONO ABS: 0.5 10*3/uL (ref 0.1–1.0)
MONOS PCT: 5 % (ref 3–12)
NEUTROS ABS: 8 10*3/uL — AB (ref 1.7–7.7)
Neutrophils Relative %: 87 % — ABNORMAL HIGH (ref 43–77)
Platelets: 226 10*3/uL (ref 150–400)
RBC: 4.92 MIL/uL (ref 3.87–5.11)
RDW: 13 % (ref 11.5–15.5)
WBC: 9.3 10*3/uL (ref 4.0–10.5)

## 2014-04-27 LAB — COMPREHENSIVE METABOLIC PANEL
ALT: 18 U/L (ref 0–35)
ANION GAP: 14 (ref 5–15)
AST: 22 U/L (ref 0–37)
Albumin: 4.2 g/dL (ref 3.5–5.2)
Alkaline Phosphatase: 75 U/L (ref 39–117)
BILIRUBIN TOTAL: 0.7 mg/dL (ref 0.3–1.2)
BUN: 9 mg/dL (ref 6–23)
CHLORIDE: 100 meq/L (ref 96–112)
CO2: 22 meq/L (ref 19–32)
CREATININE: 0.64 mg/dL (ref 0.50–1.10)
Calcium: 9.2 mg/dL (ref 8.4–10.5)
GLUCOSE: 102 mg/dL — AB (ref 70–99)
Potassium: 3.7 mEq/L (ref 3.7–5.3)
Sodium: 136 mEq/L — ABNORMAL LOW (ref 137–147)
Total Protein: 8.2 g/dL (ref 6.0–8.3)

## 2014-04-27 LAB — URINALYSIS, ROUTINE W REFLEX MICROSCOPIC
BILIRUBIN URINE: NEGATIVE
Glucose, UA: NEGATIVE mg/dL
HGB URINE DIPSTICK: NEGATIVE
Ketones, ur: NEGATIVE mg/dL
Leukocytes, UA: NEGATIVE
NITRITE: NEGATIVE
PROTEIN: NEGATIVE mg/dL
Specific Gravity, Urine: 1.008 (ref 1.005–1.030)
UROBILINOGEN UA: 0.2 mg/dL (ref 0.0–1.0)
pH: 6 (ref 5.0–8.0)

## 2014-04-27 LAB — LIPASE, BLOOD: LIPASE: 14 U/L (ref 11–59)

## 2014-04-27 LAB — POC URINE PREG, ED: Preg Test, Ur: NEGATIVE

## 2014-04-27 MED ORDER — PROMETHAZINE HCL 25 MG/ML IJ SOLN
25.0000 mg | Freq: Once | INTRAMUSCULAR | Status: AC
  Administered 2014-04-27: 25 mg via INTRAVENOUS
  Filled 2014-04-27: qty 1

## 2014-04-27 MED ORDER — ONDANSETRON HCL 4 MG/2ML IJ SOLN
4.0000 mg | Freq: Once | INTRAMUSCULAR | Status: AC
Start: 2014-04-27 — End: 2014-04-27
  Administered 2014-04-27: 4 mg via INTRAVENOUS
  Filled 2014-04-27: qty 2

## 2014-04-27 MED ORDER — ONDANSETRON 4 MG PO TBDP: ORAL_TABLET | ORAL | Status: DC

## 2014-04-27 MED ORDER — SODIUM CHLORIDE 0.9 % IV BOLUS (SEPSIS)
1000.0000 mL | Freq: Once | INTRAVENOUS | Status: AC
  Administered 2014-04-27: 1000 mL via INTRAVENOUS

## 2014-04-27 MED ORDER — DICYCLOMINE HCL 10 MG/ML IM SOLN
20.0000 mg | Freq: Once | INTRAMUSCULAR | Status: AC
  Administered 2014-04-27: 20 mg via INTRAMUSCULAR
  Filled 2014-04-27: qty 2

## 2014-04-27 MED ORDER — ONDANSETRON HCL 4 MG/2ML IJ SOLN
4.0000 mg | Freq: Once | INTRAMUSCULAR | Status: AC
  Administered 2014-04-27: 4 mg via INTRAVENOUS
  Filled 2014-04-27: qty 2

## 2014-04-27 MED ORDER — DICYCLOMINE HCL 20 MG PO TABS: 20.0000 mg | ORAL_TABLET | Freq: Two times a day (BID) | ORAL | Status: DC

## 2014-04-27 MED ORDER — ONDANSETRON 4 MG PO TBDP
8.0000 mg | ORAL_TABLET | Freq: Once | ORAL | Status: AC
  Administered 2014-04-27: 8 mg via ORAL
  Filled 2014-04-27: qty 2

## 2014-04-27 NOTE — ED Notes (Signed)
Pt presents from home with c/o LUQ abdominal pain with NVD that began last night around 2000.  Pt is A&Ox4. Spanish interpreter phone used for triage.

## 2014-04-27 NOTE — ED Notes (Signed)
Pt went with daughter to peds.

## 2014-04-27 NOTE — ED Notes (Signed)
Pt up to br x2 with diarrhea pt given crackers

## 2014-04-27 NOTE — Discharge Instructions (Signed)
Tome zofran segn las indicaciones segn sea necesario para las nuseas . Mantngase bien hidratado.  Gastroenteritis viral (Viral Gastroenteritis) La gastroenteritis viral tambin es conocida como gripe del [place]. Este trastorno [employer] y el tubo digestivo. Puede causar diarrea y vmitos repentinos. La enfermedad generalmente dura entre 3 y [address]. La Harley-Davidson de las personas desarrolla una respuesta inmunolgica. Con el tiempo, esto elimina el virus. Mientras se desarrolla esta respuesta natural, el virus puede afectar en forma importante su salud.  CAUSAS Muchos virus diferentes pueden causar gastroenteritis, por ejemplo el rotavirus o el norovirus. Estos virus pueden contagiarse al consumir alimentos o agua contaminados. Tambin puede contagiarse al compartir utensilios u otros artculos personales con una persona infectada o al tocar una superficie contaminada.  SNTOMAS Los sntomas ms comunes son diarrea y vmitos. Estos problemas pueden causar una prdida grave de lquidos corporales(deshidratacin) y un desequilibrio de sales corporales(electrolitos). Otros sntomas pueden ser:   Grant Ruts.  Dolor de [place].  Fatiga.  Dolor abdominal. DIAGNSTICO  El mdico podr hacer el diagnstico de gastroenteritis viral basndose en los sntomas y el examen fsico Tambin pueden tomarle una muestra de materia fecal para diagnosticar la presencia de virus u otras infecciones.  TRATAMIENTO Esta enfermedad generalmente desaparece sin tratamiento. Los tratamientos estn dirigidos a Social research officer, government. Los casos ms graves de gastroenteritis viral implican vmitos tan intensos que no es posible retener lquidos. En [employer], los lquidos deben administrarse a travs de una va intravenosa (IV).  INSTRUCCIONES PARA EL CUIDADO DOMICILIARIO  Beba suficientes lquidos para mantener la orina clara o de color amarillo plido. Beba pequeas cantidades de lquido con frecuencia y aumente la  cantidad segn la tolerancia.  Pida instrucciones especficas a su mdico con respecto a la rehidratacin.  Evite:  Alimentos que Nurse, adult.  Alcohol.  Gaseosas.  TabacoVista Lawman.  Bebidas con cafena.  Lquidos muy calientes o fros.  Alimentos muy grasos.  Comer demasiado a Licensed conveyancer.  Productos lcteos hasta 24 a 48 horas despus de que se detenga la diarrea.  Puede consumir probiticos. Los probiticos son cultivos activos de bacterias beneficiosas. Pueden disminuir la cantidad y el nmero de deposiciones diarreicas en el adulto. Se encuentran en los yogures con cultivos activos y en los suplementos.  Lave bien sus manos para evitar que se disemine el virus.  Slo tome medicamentos de venta libre o recetados para Primary school teacher, las molestias o bajar la fiebre segn las indicaciones de su mdico. No administre aspirina a los nios. Los medicamentos antidiarreicos no son recomendables.  Consulte a su mdico si puede seguir tomando sus medicamentos recetados o de [employer].  Cumpla con todas las visitas de control, segn le indique su mdico. SOLICITE ATENCIN MDICA DE INMEDIATO SI:  No puede retener lquidos.  No hay emisin de orina durante 6 a 8 horas.  Le falta el aire.  Observa sangre en el vmito (se ve como caf molido) o en la materia fecal.  Siente dolor abdominal que empeora o se concentra en una zona pequea (se localiza).  Tiene nuseas o vmitos persistentes.  Tiene fiebre.  El paciente es un nio menor de 3 meses y [place].  El paciente es un nio mayor de 3 meses, tiene fiebre y sntomas persistentes.  El paciente es un nio mayor de 3 meses y tiene fiebre y sntomas que empeoran repentinamente.  El paciente es un beb y no tiene lgrimas cuando llora. ASEGRESE QUE:   Comprende estas instrucciones.  Controlar su enfermedad.  Solicitar ayuda inmediatamente si no mejora o si empeora. Document Released: 06/01/2005  Document Revised: 08/24/2011 [facility]aoExitCare Patient Information 2015 [place]getExitCare, MarylandLLC. This information is not intended to replace advice given to you by your health care provider. Make sure you discuss any questions you have with your health care provider.  Dolor abdominal (Abdominal Pain) El dolor puede tener muchas causas. Normalmente la causa del dolor abdominal no es una enfermedad y Scientist, clinical (histocompatibility and immunogenetics)mejorar sin TEFL teachertratamiento. Frecuentemente puede controlarse y tratarse en casa. Su mdico le Medical sales representativerealizar un examen fsico y posiblemente solicite anlisis de sangre y radiografas para ayudar a Chief Strategy Officerdeterminar la gravedad de su dolor. Sin embargo, en IAC/InterActiveCorpmuchos casos, debe transcurrir ms tiempo antes de que se pueda Clinical research associateencontrar una causa evidente del dolor. Antes de llegar a ese punto, es posible que su mdico no sepa si necesita ms pruebas o un tratamiento ms profundo. INSTRUCCIONES PARA EL CUIDADO EN EL HOGAR  Est atento al dolor para ver si hay cambios. Las siguientes indicaciones ayudarn a Architectural technologistaliviar cualquier molestia que pueda sentir:  [place]ome solo medicamentos de venta libre o recetados, segn las indicaciones del mdico.  No tome laxantes a menos que se lo haya indicado su mdico.  Pruebe con Neomia Dearuna dieta lquida absoluta (caldo, t o agua) segn se lo indique su mdico. Introduzca gradualmente una dieta normal, segn su tolerancia. SOLICITE ATENCIN MDICA SI:  Tiene dolor abdominal sin explicacin.  Tiene dolor abdominal relacionado con nuseas o diarrea.  Tiene dolor cuando orina o defeca.  Experimenta dolor abdominal que lo despierta de noche.  Tiene dolor abdominal que empeora o mejora cuando come alimentos.  Tiene dolor abdominal que empeora cuando come alimentos grasosos.  Tiene fiebre. SOLICITE ATENCIN MDICA DE INMEDIATO SI:   El dolor no desaparece en un plazo mximo de 2horas.  No deja de (vomitar).  El Engineer, miningdolor se siente solo en partes del abdomen, como el lado derecho o la parte inferior izquierda del  abdomen.  Evaca materia fecal sanguinolenta o negra, de aspecto alquitranado. ASEGRESE DE QUE:  Comprende estas instrucciones.  Controlar su afeccin.  Recibir ayuda de inmediato si no mejora o si empeora. Document Released: 06/01/2005 Document Revised: 06/06/2013 [facility] CenterExitCare Patient Information 2015 [place] SpringsExitCare, MarylandLLC. This information is not intended to replace advice given to you by your health care provider. Make sure you discuss any questions you have with your health care provider.  Diarrea  (Diarrhea) La diarrea consiste en evacuaciones intestinales frecuentes, blandas o acuosas. Puede hacerlo sentir dbil y deshidratado. La deshidratacin puede hacer que se sienta cansado, sediento, tener la boca seca y que haya disminucin de [place]orina, que a menudo es de color amarillo oscuro. La diarrea es un signo de otro problema, generalmente una infeccin que no durar Con-waymucho tiempo. En la International Business Machinesmayora de los casos, la diarrea dura tpicamente 2 a 3 das. Sin embargo, puede durar ms tiempo si se trata de un signo de algo ms serio. Es importante tratar la diarrea como lo indique su mdico para disminuir o prevenir futuros episodios de Research scientist (medical)diarrea.  CAUSAS  Algunas causas comunes son:   Infecciones gastrointestinales causadas por virus, bacterias o parsitos.  Intoxicacin alimentaria o alergias a los alimentos.  Ciertos medicamentos, como los antibiticos, quimioterapia y laxantes.  Edulcorantes artificiales y fructosa.  Los trastornos Sprint Nextel Corporationdigestivos. INSTRUCCIONES PARA EL CUIDADO EN EL HOGAR   Asegure una adecuada ingesta de lquidos (hidratacin). Evite los lquidos que contengan azcares simples o las bebidas deportivas, los jugos de frutas, los productos derivados de la  leche entera y Langloislas gaseosas. Si bebe la cantidad suficiente de lquidos, la orina debe ser clara o amarillo plido. Una solucin de rehidratacin oral se puede comprar en las farmacias, en las tiendas minoristas y por Internet. Se puede  preparar una solucin de rehidratacin oral casera con los siguientes ingredientes:   - cucharadita de sal.   cucharadita de bicarbonato.   de cucharadita de sal sustituta que contenga cloruro de potasio.  1  cucharada de azcar.  1l (34 onzas) de agua.  Ciertos alimentos y bebidas pueden aumentar la velocidad a la que el alimento se mueve a travs del tracto gastrointestinal (GI). Estos alimentos y bebidas deben evitarse e incluyen:  Bebidas alcohlicas y con cafena.  Alimentos ricos en fibra, como frutas y verduras, nueces, semillas, panes y cereales integrales.  Alimentos y bebidas endulzados con alcoholes de azcar, tales como xilitol, sorbitol, y manitol.  Algunos alimentos pueden ser bien tolerados y puede ayudar a Lehman Brothersespesar las heces, incluyendo:  Alimentos con almidn, como arroz, pan, pasta, cereales bajos en azcar, avena, smola de maz, papas al horno, galletas y panecillos.  Bananas.  Pur de Praxairmanzana.  Agregue alimentos ricos en probiticos a la dieta del nio para ayudar a aumentar las bacterias saludables en el tracto gastrointestinal, como el yogur y productos lcteos fermentados.  Lvese bien las manos despus de cada episodio de diarrea.  Tome slo medicamentos de venta libre o recetados, segn las indicaciones del mdico.  Marreroome un bao caliente para ayudar a disminuir ardor o dolor por los episodios frecuentes de diarrea. SOLICITE ATENCIN MDICA DE INMEDIATO SI:   No puede retener los lquidos.  Tiene vmitos persistentes.  Maxie Betterbserva sangre en la materia fecal, o las heces son negras y de aspecto alquitranado.  No hay emisin de [place]orina durante 6 a 8 horas o elimina una pequea cantidad de Icelandorina muy oscura.  Tiene dolor abdominal que aumenta o se localiza.  Est muy mareado o se desvanece.  Sufre un dolor intenso de Turkmenistancabeza.  La diarrea empeora o no mejora.  Tiene fiebre o sntomas que persisten durante ms de 2 o 3 das.  Tiene fiebre y los  sntomas empeoran de manera sbita. ASEGRESE DE QUE:   Comprende estas instrucciones.  Controlar su enfermedad.  Solicitar ayuda de inmediato si no mejora o si empeora. Document Released: 06/01/2005 Document Revised: 05/18/2012 [facility] ParisExitCare Patient Information 2015 [place]ExitCare, MarylandLLC. This information is not intended to replace advice given to you by your health care provider. Make sure you discuss any questions you have with your health care provider.  Nuseas y Vmitos (Nausea and Vomiting) La nusea es la sensacin de Dentistmalestar en el estmago o de la necesidad de vomitar. El vmito es un reflejo por el que los contenidos del estmago salen por la boca. El vmito puede ocasionar prdida de lquidos del organismo (deshidratacin). Los nios y los ONEOKadultos mayores pueden deshidratarse rpidamente (en especial si tambin tienen diarrea). Las nuseas y los vmitos son sntoma de un trastorno o enfermedad. Es importante Emergency planning/management officeraveriguar la causa de los sntomas. CAUSAS  Irritacin directa de la membrana que cubre el Cranfordestmago. Esta irritacin puede ser resultado del aumento de la produccin de cido, (reflujo gastroesofgico), infecciones, intoxicacin alimentaria, ciertos medicamentos (como antinflamatorios no esteroideos), consumo de alcohol o de tabaco.  Seales del cerebro.Estas seales pueden ser un dolor de cabeza, exposicin al calor, trastornos del odo interno, aumento de la presin en el cerebro por lesiones, infeccin, un tumor o conmocin cerebral, estmulos emocionales o  problemas metablicos.  Una obstruccin en el tracto gastrointestinal (obstruccin intestinal).  Ciertas enfermedades como la diabetes, problemas en la vescula biliar, apendicitis, problemas renales, cncer, sepsis, sntomas atpicos de infarto o trastornos alimentarios.  Tratamientos mdicos como la quimioterapia y la radiacin.  Medicamentos que inducen al sueo (anestesia general) durante [place]. DIAGNSTICO  El  mdico podr solicitarle algunos anlisis si los problemas no mejoran luego de [address]. Tambin podrn pedirle anlisis si los sntomas son graves o si el motivo de los vmitos o las nuseas no est claro. Los [employer] ser:   Anlisis de [place].  Anlisis de [place].  Pruebas de materia fecal.  Cultivos (para buscar evidencias de infeccin).  Radiografas u otros estudios por imgenes. Los [employer] de las pruebas lo ayudarn al mdico a tomar decisiones acerca del mejor curso de tratamiento o la necesidad de [employer].  TRATAMIENTO  Debe estar bien hidratado. Beba con frecuencia pequeas cantidades de lquido.Puede beber agua, bebidas deportivas, caldos claros o comer pequeos trocitos de hielo o gelatina para mantenerse hidratado.Cuando coma, hgalo lentamente para evitar las nuseas.Hay medicamentos para evitar las nuseas que pueden aliviarlo.  INSTRUCCIONES PARA EL CUIDADO DOMICILIARIO  Si su mdico le prescribe medicamentos tmelos como se le haya indicado.  Si no tiene hambre, no se fuerce a comer. Sin embargo, es necesario que tome lquidos.  Si tiene hambre alimntese con una dieta normal, a menos que el mdico le indique otra cosa.  Los mejores alimentos son Neomia Dear combinacin de carbohidratos complejos (arroz, trigo, papas, pan), carnes magras, yogur, frutas y Sports administrator.  Evite los alimentos ricos en grasas porque dificultan la digestin.  Beba gran cantidad de lquido para mantener la orina de tono claro o color amarillo plido.  Si est deshidratado, consulte a su mdico para que le d instrucciones especficas para volver a hidratarlo. Los signos de deshidratacin son:  Franz Dell sed.  Labios y boca secos.  Mareos.  Larose Kells.  Disminucin de la frecuencia y cantidad de la [place].  Confusin.  Tiene el pulso o la respiracin acelerados. SOLICITE ATENCIN MDICA DE INMEDIATO SI:  Vomita sangre o algo similar a la borra del caf.  La  materia fecal (heces) es negra o tiene [place].  Sufre una cefalea grave o rigidez en el cuello.  Se siente confundido.  Siente dolor abdominal intenso.  Tiene dolor en el pecho o dificultad para respirar.  No orina por 8 horas.  Tiene la piel fra y pegajosa.  Sigue vomitando durante ms de 24 a 48 horas.  Tiene fiebre. ASEGRESE QUE:   Comprende estas instrucciones.  Controlar su enfermedad.  Solicitar ayuda inmediatamente si no mejora o si empeora. Document Released: 06/21/2007 Document Revised: 08/24/2011 [facility] Patient Information 2015 [place], [place]. This information is not intended to replace advice given to you by your health care provider. Make sure you discuss any questions you have with your health care provider.

## 2014-04-27 NOTE — ED Notes (Signed)
Pager [phone]

## 2014-04-27 NOTE — ED Notes (Signed)
Pt vomiting approx 250 ml food particles

## 2014-04-27 NOTE — ED Notes (Signed)
Pt states nausea  Relieved no further emesis tolerated sprite

## 2014-04-27 NOTE — ED Provider Notes (Signed)
CSN: [id number]     Arrival date & time 04/27/14  1615 History   First MD Initiated Contact with Patient 04/27/14 1815     Chief Complaint  Patient presents with  . Abdominal Pain  . Emesis     (Consider location/radiation/quality/duration/timing/severity/associated sxs/prior Treatment) HPI Comments: This is a 48 year old female with past medical history of asthma and hypercholesterolemia presenting to the Emergency Department with a complaint of abdominal pain with associated vomiting and diarrhea. She states her symptoms began yesterday around 8:00am. Her pain is described as sharp and localized to her LUQ. She states it feels like her "stomach is burning." She states she also had multiple episodes of nonbloody emesis and watery, nonbloody diarrhea. She did not take any medications for her symptoms. No aggravating or alleviating factors. She endorses cold chills. She denies any fevers, hematemesis, hematochezia or recent travel. LMP 04/13/14. Her daughter was just discharged from the pediatric side of the ED with similar symptoms.  Patient is a 48 y.o. female presenting with abdominal pain and vomiting. The history is provided by the patient. The history is limited by a language barrier. A language interpreter was used.  Abdominal Pain Associated symptoms: chills, diarrhea, nausea and vomiting   Emesis Associated symptoms: abdominal pain, chills and diarrhea     Past Medical History  Diagnosis Date  . Asthma   . Hypercholesteremia    History reviewed. No pertinent past surgical history. No family history on file. History  Substance Use Topics  . Smoking status: Never Smoker   . Smokeless tobacco: Not on file  . Alcohol Use: No   OB History    No data available     Review of Systems  Constitutional: Positive for chills.  Gastrointestinal: Positive for nausea, vomiting, abdominal pain and diarrhea. Negative for blood in stool.  All other systems reviewed and are  negative.     Allergies  Aspirin and Tylenol  Home Medications   Prior to Admission medications   Medication Sig Start Date End Date Taking? Authorizing Provider  diazepam (VALIUM) 5 MG tablet Take 1 tablet (5 mg total) by mouth every 6 (six) hours as needed for anxiety (spasms). Patient not taking: Reported on 04/27/2014 01/24/14   Phill MutterPeter S Dammen, PA-C  dicyclomine (BENTYL) 20 MG tablet Take 1 tablet (20 mg total) by mouth 2 (two) times daily. 04/27/14   Colby Catanese M Gladie Gravette, PA-C  EPINEPHrine (EPIPEN) 0.3 mg/0.3 mL DEVI Inject 0.3 mLs (0.3 mg total) into the muscle once. 07/31/11   Amy Coker, MD  methocarbamol (ROBAXIN) 500 MG tablet Take 500 mg by mouth every 8 (eight) hours. 02/16/14   Historical Provider, MD  ondansetron (ZOFRAN ODT) 4 MG disintegrating tablet 4mg  ODT q4 hours prn nausea/vomit 04/27/14   Finleigh Cheong M Talia Hoheisel, PA-C  oxyCODONE (OXY IR/ROXICODONE) 5 MG immediate release tablet Take 1 tablet (5 mg total) by mouth every 4 (four) hours as needed for severe pain. Patient not taking: Reported on 04/27/2014 01/24/14   Phill MutterPeter S Dammen, PA-C   BP 117/62 mmHg  Pulse 71  Temp(Src) 98.3 F (36.8 C) (Oral)  Resp 16  Ht 5\' 3"  (1.6 m)  SpO2 98%  LMP 04/14/2014 Physical Exam  Constitutional: She is oriented to person, place, and time. She appears well-developed and well-nourished. No distress.  HENT:  Head: Normocephalic and atraumatic.  Mouth/Throat: Oropharynx is clear and moist.  Eyes: Conjunctivae are normal. No scleral icterus.  Neck: Normal range of motion. Neck supple.  Cardiovascular: Normal rate,  regular rhythm and normal heart sounds.   Pulmonary/Chest: Effort normal and breath sounds normal.  Abdominal: Soft. Normal appearance and bowel sounds are normal. She exhibits no distension. There is tenderness in the epigastric area and left upper quadrant. There is no rigidity, no rebound, no guarding and no CVA tenderness.  No peritoneal signs.  Musculoskeletal: Normal range of motion. She  exhibits no edema.  Neurological: She is alert and oriented to person, place, and time.  Skin: Skin is warm and dry. She is not diaphoretic.  Psychiatric: She has a normal mood and affect. Her behavior is normal.  Nursing note and vitals reviewed.   ED Course  Procedures (including critical care time) Labs Review Labs Reviewed  CBC WITH DIFFERENTIAL - Abnormal; Notable for the following:    Neutrophils Relative % 87 (*)    Neutro Abs 8.0 (*)    Lymphocytes Relative 7 (*)    All other components within normal limits  COMPREHENSIVE METABOLIC PANEL - Abnormal; Notable for the following:    Sodium 136 (*)    Glucose, Bld 102 (*)    All other components within normal limits  LIPASE, BLOOD  URINALYSIS, ROUTINE W REFLEX MICROSCOPIC  POC URINE PREG, ED    Imaging Review No results found.   EKG Interpretation None      MDM   Final diagnoses:  Left sided abdominal pain  Non-intractable vomiting with nausea, vomiting of unspecified type  Diarrhea   Patient presented with abdominal pain, nausea, vomiting and diarrhea. She is nontoxic appearing and in no apparent distress. Afebrile, vital signs stable. Abdomen is soft with tenderness midepigastric and left upper quadrant. No peritoneal signs. Labs obtained prior to patient being seen, no acute findings. Her daughter was just discharged from the pediatric side of the emergency department with the same symptoms. Most likely viral illness. Pt received IV fluids, bentyl, zofran and phenergan with some relief of her symptoms. Tolerates PO (crackers and fluids). Repeat abdominal exam still with tenderness, however significantly improved from initial exam. Stable for d/c. Return precautions given. Patient states understanding of treatment care plan and is agreeable.  Kathrynn SpeedRobyn M Shalinda Burkholder, PA-C 04/27/14 2136  Audree CamelScott T Goldston, MD 04/28/14 [phone]

## 2014-09-12 ENCOUNTER — Emergency Department (HOSPITAL_COMMUNITY)
Admission: EM | Admit: 2014-09-12 | Discharge: 2014-09-13 | Disposition: A | Discharge status: Home / Self Care | Payer: Self-pay | Attending: Emergency Medicine | Admitting: Emergency Medicine

## 2014-09-12 ENCOUNTER — Encounter (HOSPITAL_COMMUNITY): Payer: Self-pay | Admitting: Emergency Medicine

## 2014-09-12 DIAGNOSIS — R1012 Left upper quadrant pain: Secondary | ICD-10-CM | POA: Insufficient documentation

## 2014-09-12 DIAGNOSIS — Z3202 Encounter for pregnancy test, result negative: Secondary | ICD-10-CM | POA: Insufficient documentation

## 2014-09-12 DIAGNOSIS — Z791 Long term (current) use of non-steroidal anti-inflammatories (NSAID): Secondary | ICD-10-CM | POA: Insufficient documentation

## 2014-09-12 DIAGNOSIS — J45909 Unspecified asthma, uncomplicated: Secondary | ICD-10-CM | POA: Insufficient documentation

## 2014-09-12 DIAGNOSIS — Z79899 Other long term (current) drug therapy: Secondary | ICD-10-CM | POA: Insufficient documentation

## 2014-09-12 DIAGNOSIS — R6883 Chills (without fever): Secondary | ICD-10-CM | POA: Insufficient documentation

## 2014-09-12 DIAGNOSIS — E780 Pure hypercholesterolemia: Secondary | ICD-10-CM | POA: Insufficient documentation

## 2014-09-12 DIAGNOSIS — R110 Nausea: Secondary | ICD-10-CM | POA: Insufficient documentation

## 2014-09-12 DIAGNOSIS — R52 Pain, unspecified: Secondary | ICD-10-CM

## 2014-09-12 NOTE — ED Notes (Signed)
Pt is c/o left upper quadrant abd pain with nausea that started about 8pm tonight

## 2014-09-13 ENCOUNTER — Emergency Department (HOSPITAL_COMMUNITY): Payer: Self-pay

## 2014-09-13 ENCOUNTER — Encounter (HOSPITAL_COMMUNITY): Payer: Self-pay

## 2014-09-13 LAB — COMPREHENSIVE METABOLIC PANEL
ALT: 15 U/L (ref 0–35)
ANION GAP: 9 (ref 5–15)
AST: 19 U/L (ref 0–37)
Albumin: 4.5 g/dL (ref 3.5–5.2)
Alkaline Phosphatase: 82 U/L (ref 39–117)
BUN: 11 mg/dL (ref 6–23)
CO2: 25 mmol/L (ref 19–32)
Calcium: 9.4 mg/dL (ref 8.4–10.5)
Chloride: 103 mmol/L (ref 96–112)
Creatinine, Ser: 0.55 mg/dL (ref 0.50–1.10)
GFR calc non Af Amer: >90 mL/min (ref >90)
GLUCOSE: 101 mg/dL — AB (ref 70–99)
POTASSIUM: 3.7 mmol/L (ref 3.5–5.1)
Sodium: 137 mmol/L (ref 135–145)
Total Bilirubin: 0.4 mg/dL (ref 0.3–1.2)
Total Protein: 7.8 g/dL (ref 6.0–8.3)

## 2014-09-13 LAB — CBC WITH DIFFERENTIAL/PLATELET
BASOS ABS: 0 10*3/uL (ref 0.0–0.1)
BASOS PCT: 0 % (ref 0–1)
EOS ABS: 0.1 10*3/uL (ref 0.0–0.7)
Eosinophils Relative: 1 % (ref 0–5)
HEMATOCRIT: 40.7 % (ref 36.0–46.0)
Hemoglobin: 14 g/dL (ref 12.0–15.0)
Lymphocytes Relative: 17 % (ref 12–46)
Lymphs Abs: 2.1 10*3/uL (ref 0.7–4.0)
MCH: 30.3 pg (ref 26.0–34.0)
MCHC: 34.4 g/dL (ref 30.0–36.0)
MCV: 88.1 fL (ref 78.0–100.0)
MONOS PCT: 7 % (ref 3–12)
Monocytes Absolute: 0.8 10*3/uL (ref 0.1–1.0)
NEUTROS PCT: 75 % (ref 43–77)
Neutro Abs: 9.3 10*3/uL — ABNORMAL HIGH (ref 1.7–7.7)
Platelets: 253 10*3/uL (ref 150–400)
RBC: 4.62 MIL/uL (ref 3.87–5.11)
RDW: 13.2 % (ref 11.5–15.5)
WBC: 12.2 10*3/uL — AB (ref 4.0–10.5)

## 2014-09-13 LAB — URINALYSIS, ROUTINE W REFLEX MICROSCOPIC
BILIRUBIN URINE: NEGATIVE
Glucose, UA: NEGATIVE mg/dL
Hgb urine dipstick: NEGATIVE
KETONES UR: NEGATIVE mg/dL
Leukocytes, UA: NEGATIVE
Nitrite: NEGATIVE
PH: 5.5 (ref 5.0–8.0)
PROTEIN: NEGATIVE mg/dL
Specific Gravity, Urine: 1.004 — ABNORMAL LOW (ref 1.005–1.030)
UROBILINOGEN UA: 0.2 mg/dL (ref 0.0–1.0)

## 2014-09-13 LAB — POC URINE PREG, ED: PREG TEST UR: NEGATIVE

## 2014-09-13 LAB — LIPASE, BLOOD: Lipase: 21 U/L (ref 11–59)

## 2014-09-13 MED ORDER — SODIUM CHLORIDE 0.9 % IV SOLN
Freq: Once | INTRAVENOUS | Status: AC
  Administered 2014-09-13: via INTRAVENOUS

## 2014-09-13 MED ORDER — OXYCODONE HCL 5 MG PO TABS: 5.0000 mg | ORAL_TABLET | ORAL | Status: DC | PRN

## 2014-09-13 MED ORDER — HYDROMORPHONE HCL 1 MG/ML IJ SOLN
1.0000 mg | Freq: Once | INTRAMUSCULAR | Status: AC
  Administered 2014-09-13: 1 mg via INTRAVENOUS
  Filled 2014-09-13: qty 1

## 2014-09-13 MED ORDER — IOHEXOL 300 MG/ML  SOLN
50.0000 mL | Freq: Once | INTRAMUSCULAR | Status: AC | PRN
  Administered 2014-09-13: 50 mL via ORAL

## 2014-09-13 MED ORDER — DICYCLOMINE HCL 20 MG PO TABS: 20.0000 mg | ORAL_TABLET | Freq: Three times a day (TID) | ORAL | Status: DC

## 2014-09-13 MED ORDER — IOHEXOL 300 MG/ML  SOLN
100.0000 mL | Freq: Once | INTRAMUSCULAR | Status: AC | PRN
  Administered 2014-09-13: 100 mL via INTRAVENOUS

## 2014-09-13 MED ORDER — ONDANSETRON HCL 4 MG/2ML IJ SOLN
4.0000 mg | Freq: Once | INTRAMUSCULAR | Status: AC
  Administered 2014-09-13: 4 mg via INTRAVENOUS
  Filled 2014-09-13: qty 2

## 2014-09-13 MED ORDER — ONDANSETRON 4 MG PO TBDP: ORAL_TABLET | ORAL | Status: DC

## 2014-09-13 NOTE — ED Notes (Signed)
Patient verbalizes understanding of discharge instructions, prescription medications, home care and follow up care. Patient ambulatory out of department at this time with family. 

## 2014-09-13 NOTE — Discharge Instructions (Signed)
Dolor abdominal (Abdominal Pain) El dolor de estmago (abdominal) puede tener muchas causas. La mayora de las veces, el dolor de [place] no es peligroso. Muchos de [employer] de dolor de estmago pueden controlarse y tratarse en casa. CUIDADOS EN EL HOGAR   No tome medicamentos que lo ayuden a defecar (laxantes), salvo que su mdico se lo indique.  Solo tome los medicamentos que le haya indicado su mdico.  Coma o beba lo que le indique su mdico. Su mdico le dir si debe seguir una dieta especial. SOLICITE AYUDA SI:  No sabe cul es la causa del dolor de [place].  Tiene dolor de estmago cuando siente ganas de vomitar (nuseas) o tiene colitis (diarrea).  Tiene dolor durante la miccin o la evacuacin.  El dolor de estmago lo despierta de noche.  Tiene dolor de [employer] empeora o [place] cuando come.  Tiene dolor de [employer] empeora cuando come CIGNA.  Tiene fiebre. SOLICITE AYUDA DE INMEDIATO SI:   El dolor no desaparece en un plazo mximo de 2horas.  No deja de (vomitar).  El dolor cambia y se Librarian, academic solo en la parte derecha o izquierda del [place].  La materia fecal es sanguinolenta o de aspecto alquitranado. ASEGRESE DE QUE:   Comprende estas instrucciones.  Controlar su afeccin.  Recibir ayuda de inmediato si no mejora o si empeora. Document Released: 08/28/2008 Document Revised: 06/06/2013 [facility] Patient Information 2015 [place], [place]. This information is not intended to replace advice given to you by your health care provider. Make sure you discuss any questions you have with your health care provider. Today your labs are within normal range your CT scan is normal not showing any pathology to be causing your discomfort.  You have been given prescriptions for Bentyl, Zofran and oxycodone, which is for pain, nausea and cramping.  You've also been given a Pharmacist, hospital to help you find a primary care physician   Emergency  Department Resource Guide 1) Find a Doctor and Pay Out of Pocket Although you won't have to find out who is covered by your insurance plan, it is a good idea to ask around and get recommendations. You will then need to call the office and see if the doctor you have chosen will accept you as a new patient and what types of options they offer for patients who are self-pay. Some doctors offer discounts or will set up payment plans for their patients who do not have insurance, but you will need to ask so you aren't surprised when you get to your appointment.  2) Contact Your Local Health Department Not all health departments have doctors that can see patients for sick visits, but many do, so it is worth a call to see if yours does. If you don't know where your local health department is, you can check in your phone book. The CDC also has a tool to help you locate your state's health department, and many state websites also have listings of all of their local health departments.  3) Find a Walk-in Clinic If your illness is not likely to be very severe or complicated, you may want to try a walk in clinic. These are popping up all over the country in pharmacies, drugstores, and shopping centers. They're usually staffed by nurse practitioners or physician assistants that have been trained to treat common illnesses and complaints. They're usually fairly quick and inexpensive. However, if you have serious medical issues or chronic medical problems, these are probably  not your best option.  No Primary Care Doctor: - Call Health Connect at  [phone] - they can help you locate a primary care doctor that  accepts your insurance, provides certain services, etc. - Physician Referral Service- [phone]  Chronic Pain Problems: Organization         Address  Phone   Notes  [facility]  [phone] Patients need to be referred by their primary care doctor.   Medication  Assistance: Organization         Address  Phone   Notes  [facility] Medication [facility] [address] [place] [place], [place] [address] [phone] --Must be a resident of [facility] -- Must have NO insurance coverage whatsoever (no Medicaid/ Medicare, etc.) -- The pt. MUST have a primary care doctor that directs their care regularly and follows them in the community   MedAssist  [phone]   [employer]  [phone]    Agencies that provide inexpensive medical care: Organization         Address  Phone   Notes  [facility]  [phone]   [facility]    [phone]   [facility] [address] [place], [place] [address] [phone]   [facility] [address] [place] [place] [phone]   Planned Parenthood    [phone]   [facility]    [phone]   [facility] [address]  [address], [place] Phone:  [phone], Fax:  [phone] Hours of Operation:  9 am - 6 pm, M-F.  Also accepts Medicaid/Medicare and self-pay.  [facility]  [address], [place] Phone: [phone], Fax: [phone]. Hours of Operation:  8:30 am - 5:30 pm, M-F.  Also accepts Medicaid and self-pay.  [facility] [place] [address] [place] Phone: [phone]   [facility] [address] Natasha Bence [place], [place] [phone], Ext. [phone] Mondays & Thursdays: 7-9 AM.  First 15 patients are seen on a first come, first serve basis.    Medicaid-accepting [employer] Providers:  Organization         Address  Phone   Notes  [facility] [address],  [phone] Also accepts self-pay patients.  [facility] [address] Laurell Josephs [place], [place]  [phone]   [facility] [address], [place]  [phone]   [facility] [address] [place] [phone]   Renaye Rakers [address], [place]   [phone] Only accepts [place] Access [place] patients after they have their name applied to their card.   Self-Pay (no insurance) in [facility]:  Organization         Address  Phone   Notes  Sickle Cell Patients, [facility] Internal Medicine [address] [place], [place] [phone]   [facility] Urgent Care [address] [place], [place] [phone]   [facility] [place]  [address], [place] [phone]   [facility]/Dr. Osei-Bonsu  [address] [place] or [address], [place] [phone] Phone number for both [place] and [place] locations is the same.  [facility] [address], [place] [phone]   [facility] [place] [address] [place] [address], [place] or [address] [phone]   [facility] [address], [place] [phone]) [phone], phone; [phone](336) [phone], fax Sees patients 1st and 3rd Saturday of every month.  Must not qualify for public or private insurance (i.e. Medicaid, Medicare, [place] Health Choice, Veterans' Benefits)  Household income should be no more than 200% of the poverty level The clinic cannot treat you if you are pregnant or think you are pregnant  Sexually transmitted diseases are not treated at the clinic.    Dental Care: Organization         Address  Phone  Notes  [facility] [address], [place] [phone] Accepts children up to age 47 who are enrolled in [place]icaid or [place] Health Choice; pregnant women with a Medicaid card; and children who have applied for Medicaid or [place] Health Choice, but were declined, whose parents can pay a reduced fee at time of service.  [facility]  Department of [facility] [place]  [address], [place] [phone] Accepts children up to age 47 who are enrolled in [place] or [place] Health Choice; pregnant women with a Medicaid card; and children who have applied for Medicaid or [place] Health Choice, but were declined, whose parents can pay a reduced fee at time of service.  [facility]  [address], [place] [phone]) [phone] Patients are seen by appointment only. Walk-ins are not accepted. [facility] will see patients 47 years of age and older. Monday - Tuesday (8am-5pm) Most Wednesdays (8:30-5pm) $30 per visit, cash only  [facility]  [address], [place] [phone]) [phone] Patients are seen by appointment only. Walk-ins are not accepted. [facility] will see patients 47 years of age and older. One Wednesday Evening (Monthly: Volunteer Based).  $30 per visit, cash only  [employer] [facility]  [phone] for adults; Children under age 744, call Graduate Pediatric Dentistry at [phone]. Children aged 84-14, please call [phone] to request a pediatric application.  Dental services are provided in all areas of dental care including fillings, crowns and bridges, complete and partial dentures, implants, gum treatment, root canals, and extractions. Preventive care is also provided. Treatment is provided to both adults and children. Patients are selected via a lottery and there is often a waiting list.   [facility] [address], [place]  [phone]) [phone] [email or web address]   [facility] [address], [place], [place] [phone], Ext. [phone] Second and Fourth Thursday of each month, opens at 6:30 AM; Clinic ends at 9 AM.  Patients are seen on a first-come first-served basis, and a limited number are seen during each clinic.   [facility]  [address], [place], [place] [phone]    Eligibility Requirements You must have lived in [place], [place], or [place] counties for at least the last three months.   You cannot be eligible for state or federal sponsored National Cityhealthcare insurance, including CIGNAVeterans Administration, [place]icaid, or [employer]icare.   You generally cannot be eligible for healthcare insurance through your employer.    How to apply: Eligibility screenings are held every Tuesday and Wednesday afternoon from 1:00 pm until 4:00 pm. You do not need an appointment for the interview!  [facility] [phone]  [place], [place], [place] [phone]   [facility] Health Department  [phone]   [facility] Health Department  [phone]   [facility] Health Department  [phone]    Behavioral Health Resources in the Community: Intensive Outpatient Programs Organization         Address  Phone  Notes  [facility] [address] [place], [place] [phone]   [facility] Outpatient [address] [place], [place] [phone]   [facility] [address] [place], [place]  [phone]   [facility] Mental Health [address],  [place], [place] [phone] or [phone]   Substance Abuse Resources Organization         Address  Phone  Notes  [facility] Drug Services  [phone]   [facility]  [phone]   [facility] [place]  [phone]   Floydene Flock  [phone]   Residential & Outpatient Substance Abuse Program  [phone]   Psychological Services Organization         Address  Phone  Notes  [facility]  [phone]   [facility] Services  [phone]   [facility] Mental Health [address] [place] [phone] or [phone]    Mobile Crisis Teams Organization         Address  Phone  Notes  [facility], Mobile Crisis Care Unit  [phone]   [facility]  [address]  [place], [place] [phone]   Doristine Locks [address] [place] [phone]    Self-Help/Support Groups Organization         Address  Phone             Notes  [facility] [place] - variety of support groups  [phone]- I7437963 Call for more information  Narcotics Anonymous (NA), [facility] [address], [employer] [place]  2 meetings at this location   Statistician         Address  Phone  Notes  [facility] [address],    [place]  [phone]   [facility]  [address] [place] [address], [place], [place] [phone]   [facility] [address] [place], [place] [phone] Admissions: 8am-3pm M-F  [facility] [address]    [place], [place] [phone]   [facility] [address] [place], [place], [place] [phone]   [facility] [address],  [place], [place] [phone]   [facility] - Intensive Outpatient [address] Laurell Josephs [address], [place], [place] [phone]   [facility] ([facility] [address] [place].,  [place], [place] [phone] or [phone]   [facility] ([facility]) [address] [place], [place] [phone] Accepts Medicaid  [facility] [place] [address],  [place] [phone] Substance Abuse/Addiction Treatment   [facility] Organization         Address  Phone  Notes  [facility]  [phone]   Angie Fava, PhD [address] Ervin Knack [place], [place]   [phone] or [phone]   [facility] Behavioral   [address] [place], [place] [phone]   [facility] [address], [place], [place] [phone] Insurance/Medicaid/sponsorship through [employer] and Families [address]  [place], [place] [phone] [facility] [place], [place] [phone]    Dr. Adele Schilder  [phone]   [facility] Dept. 1) [address] [place] 2) [place] 3)  [place] [address], [place] [phone]  [phone]   [place] [phone] or [phone] (After Hours)

## 2014-09-13 NOTE — ED Provider Notes (Signed)
CSN: [id number]     Arrival date & time 09/12/14  2258 History   First MD Initiated Contact with Patient 09/12/14 2354     Chief Complaint  Patient presents with  . Abdominal Pain     (Consider location/radiation/quality/duration/timing/severity/associated sxs/prior Treatment) Patient is a 49 y.o. female presenting with abdominal pain. The history is provided by the patient.  Abdominal Pain Pain location:  LUQ Pain quality: aching, fullness, pressure and sharp   Pain radiates to:  Does not radiate Pain severity:  Severe Onset quality:  Gradual Duration:  4 hours Timing:  Constant Progression:  Unchanged Chronicity:  New Context: not alcohol use, not diet changes, not eating, not laxative use, not medication withdrawal, not previous surgeries, not recent illness, not recent sexual activity, not recent travel, not retching, not sick contacts, not suspicious food intake and not trauma   Relieved by:  None tried Worsened by:  Palpation and movement Ineffective treatments:  None tried Associated symptoms: chills and nausea   Associated symptoms: no anorexia, no chest pain, no constipation, no cough, no diarrhea, no dysuria, no fever, no hematemesis, no hematochezia, no hematuria, no shortness of breath, no sore throat and no vomiting   Risk factors: no alcohol abuse, no aspirin use, not elderly, has not had multiple surgeries, no NSAID use, not obese, not pregnant and no recent hospitalization     Past Medical History  Diagnosis Date  . Asthma   . Hypercholesteremia    History reviewed. No pertinent past surgical history. History reviewed. No pertinent family history. History  Substance Use Topics  . Smoking status: Never Smoker   . Smokeless tobacco: Not on file  . Alcohol Use: No   OB History    No data available     Review of Systems  Unable to perform ROS Constitutional: Positive for chills. Negative for fever.  HENT: Negative for sore throat.   Respiratory: Negative  for cough and shortness of breath.   Cardiovascular: Negative for chest pain.  Gastrointestinal: Positive for nausea and abdominal pain. Negative for vomiting, diarrhea, constipation, blood in stool, hematochezia, abdominal distention, rectal pain, anorexia and hematemesis.  Genitourinary: Negative for dysuria, frequency, hematuria and flank pain.  Skin: Negative for wound.  All other systems reviewed and are negative.     Allergies  Aspirin and Tylenol  Home Medications   Prior to Admission medications   Medication Sig Start Date End Date Taking? Authorizing Provider  atorvastatin (LIPITOR) 20 MG tablet Take 20 mg by mouth daily.   Yes Historical Provider, MD  EPINEPHrine (EPIPEN) 0.3 mg/0.3 mL DEVI Inject 0.3 mLs (0.3 mg total) into the muscle once. 07/31/11  Yes Amy Coker, MD  lidocaine (LIDODERM) 5 % Place 1 patch onto the skin daily. Remove & Discard patch within 12 hours or as directed by MD   Yes Historical Provider, MD  methocarbamol (ROBAXIN) 500 MG tablet Take 500 mg by mouth 2 (two) times daily as needed for muscle spasms.  02/16/14  Yes Historical Provider, MD  naproxen (EC NAPROSYN) 500 MG EC tablet Take 500 mg by mouth 2 (two) times daily with a meal.   Yes Historical Provider, MD  predniSONE (DELTASONE) 10 MG tablet Take 10 mg by mouth as directed. 5 tabs on day 1, 4 tabs on day 2, 4 tabs on day 3, 3 tabs on day 4, 2 tabs on day 5, 1 tab on day 6   Yes Historical Provider, MD  diazepam (VALIUM) 5 MG tablet Take 1  tablet (5 mg total) by mouth every 6 (six) hours as needed for anxiety (spasms). Patient not taking: Reported on 04/27/2014 01/24/14   Ivonne AndrewPeter Dammen, PA-C  dicyclomine (BENTYL) 20 MG tablet Take 1 tablet (20 mg total) by mouth 4 (four) times daily -  before meals and at bedtime. 09/13/14   Earley FavorGail Michal Callicott, NP  ondansetron (ZOFRAN ODT) 4 MG disintegrating tablet 4mg  ODT q4 hours prn nausea/vomit 09/13/14   Earley FavorGail Kamera Dubas, NP  oxyCODONE (OXY IR/ROXICODONE) 5 MG immediate release  tablet Take 1 tablet (5 mg total) by mouth every 4 (four) hours as needed for severe pain. 09/13/14   Earley FavorGail Madisen Ludvigsen, NP   BP 104/74 mmHg  Pulse 68  Temp(Src) 97.8 F (36.6 C) (Oral)  Resp 18  SpO2 99%  LMP 09/04/2014 (Exact Date) Physical Exam  Constitutional: She appears well-developed and well-nourished.  HENT:  Head: Normocephalic.  Mouth/Throat: Oropharynx is clear and moist.  Eyes: Pupils are equal, round, and reactive to light.  Neck: Normal range of motion.  Cardiovascular: Normal rate and regular rhythm.   Pulmonary/Chest: Effort normal and breath sounds normal. No respiratory distress. She has no wheezes.  Abdominal: Soft. Bowel sounds are normal. She exhibits no distension. There is tenderness in the left upper quadrant. There is no rebound.  Musculoskeletal: Normal range of motion.  Neurological: She is alert.  Skin: Skin is warm. No rash noted.  Nursing note and vitals reviewed.   ED Course  Procedures (including critical care time) Labs Review Labs Reviewed  CBC WITH DIFFERENTIAL/PLATELET - Abnormal; Notable for the following:    WBC 12.2 (*)    Neutro Abs 9.3 (*)    All other components within normal limits  URINALYSIS, ROUTINE W REFLEX MICROSCOPIC - Abnormal; Notable for the following:    Specific Gravity, Urine 1.004 (*)    All other components within normal limits  COMPREHENSIVE METABOLIC PANEL - Abnormal; Notable for the following:    Glucose, Bld 101 (*)    All other components within normal limits  LIPASE, BLOOD  POC URINE PREG, ED    Imaging Review Ct Abdomen Pelvis W Contrast  09/13/2014   CLINICAL DATA:  Acute onset of left upper quadrant abdominal pain, nausea and leukocytosis. Initial encounter.  EXAM: CT ABDOMEN AND PELVIS WITH CONTRAST  TECHNIQUE: Multidetector CT imaging of the abdomen and pelvis was performed using the standard protocol following bolus administration of intravenous contrast.  CONTRAST:  100mL OMNIPAQUE IOHEXOL 300 MG/ML  SOLN   COMPARISON:  Pelvic ultrasound performed 04/03/2014  FINDINGS: Minimal bibasilar atelectasis is noted.  The liver and spleen are unremarkable in appearance. The gallbladder is within normal limits. The pancreas and adrenal glands are unremarkable.  The kidneys are unremarkable in appearance. There is no evidence of hydronephrosis. No renal or ureteral stones are seen. No perinephric stranding is appreciated.  No free fluid is identified. The small bowel is unremarkable in appearance. The stomach is within normal limits. No acute vascular abnormalities are seen.  The appendix is normal in caliber, without evidence for appendicitis. The colon is unremarkable in appearance.  The bladder is moderately distended. A small urachal remnant is seen. The uterus is grossly unremarkable in appearance. The ovaries are relatively symmetric. No suspicious adnexal masses are seen. No inguinal lymphadenopathy is seen.  No acute osseous abnormalities are identified. There is mild grade 1 anterolisthesis of L4 on L5, reflecting underlying facet disease.  IMPRESSION: Unremarkable contrast-enhanced CT of the abdomen and pelvis.   Electronically Signed  By: Roanna Raider M.D.   On: 09/13/2014 05:41   Dg Abd Acute W/chest  09/13/2014   CLINICAL DATA:  Acute onset of left upper quadrant abdominal pain and nausea. Initial encounter.  EXAM: ACUTE ABDOMEN SERIES (ABDOMEN 2 VIEW & CHEST 1 VIEW)  COMPARISON:  Thoracic and lumbar spine radiographs performed 01/24/2014  FINDINGS: The lungs are well-aerated and clear. There is no evidence of focal opacification, pleural effusion or pneumothorax. The cardiomediastinal silhouette is within normal limits.  The visualized bowel gas pattern is unremarkable. Slight distention of a short loop of small bowel is thought to be transient in nature. Scattered stool and air are seen within the colon; there is no evidence of small bowel dilatation to suggest obstruction. No free intra-abdominal air is  identified on the provided upright view.  No acute osseous abnormalities are seen; the sacroiliac joints are unremarkable in appearance.  IMPRESSION: 1. Unremarkable bowel gas pattern; no free intra-abdominal air seen. Small to moderate amount of stool noted in the colon. 2. No acute cardiopulmonary process seen.   Electronically Signed   By: Roanna Raider M.D.   On: 09/13/2014 02:53     EKG Interpretation None     Shin CT scan, urine, chest x-ray all within normal parameters.  She's has a slightly elevated white count of 12 she's been given prescriptions for Bentyl oxycodone and Zofran with instructions to follow-up as needed.  She's also been given a resource list to help her find a primary care physician MDM   Final diagnoses:  Pain  Left upper quadrant pain         Earley Favor, NP 09/13/14 0454  Devoria Albe, MD 09/13/14 [phone]

## 2015-04-01 ENCOUNTER — Ambulatory Visit (INDEPENDENT_AMBULATORY_CARE_PROVIDER_SITE_OTHER): Payer: Self-pay | Admitting: Internal Medicine

## 2015-04-01 ENCOUNTER — Encounter: Payer: Self-pay | Admitting: Internal Medicine

## 2015-04-01 ENCOUNTER — Telehealth: Payer: Self-pay

## 2015-04-01 VITALS — BP 112/74 | HR 68 | Ht 61.0 in | Wt 179.0 lb

## 2015-04-01 DIAGNOSIS — Z0000 Encounter for general adult medical examination without abnormal findings: Secondary | ICD-10-CM

## 2015-04-01 DIAGNOSIS — M25562 Pain in left knee: Secondary | ICD-10-CM

## 2015-04-01 DIAGNOSIS — G8929 Other chronic pain: Secondary | ICD-10-CM

## 2015-04-01 DIAGNOSIS — M545 Low back pain: Secondary | ICD-10-CM

## 2015-04-01 DIAGNOSIS — Z780 Asymptomatic menopausal state: Secondary | ICD-10-CM

## 2015-04-01 MED ORDER — NAPROXEN 500 MG PO TBEC: 500.0000 mg | DELAYED_RELEASE_TABLET | Freq: Two times a day (BID) | ORAL | Status: DC

## 2015-04-01 MED ORDER — ATORVASTATIN CALCIUM 20 MG PO TABS: 20.0000 mg | ORAL_TABLET | Freq: Every day | ORAL | Status: DC

## 2015-04-01 MED ORDER — GABAPENTIN 300 MG PO CAPS: 300.0000 mg | ORAL_CAPSULE | Freq: Every day | ORAL | Status: DC

## 2015-04-01 NOTE — Patient Instructions (Addendum)
PLease call [name] and [facility] Naproxen dos veces al dia con comida Spoke with patient regarding next visit to be fasting for labs:  FLP, CMP

## 2015-04-01 NOTE — Progress Notes (Signed)
Subjective:    Patient ID: Felicia Ray, female    DOB: 04-Feb-1966, 49 y.o.   MRN: [id number]  HPI  1.  Left knee and left low back pain:  Pt. Released from [facility].  Was referred to Dr. Ethelene Hal for chronic pain at same clinic, but released from entire clinic.  Told needs second opinion.  Spoke with case Production designer, theatre/television/film and is to be seen for second opinion in [place] soon.  Not clear from patient when that will be.  [name] is caseworker--pt. Cannot recall last name.  Will check phone message in patient's other chart for that. Pt. States she has not spoken with [name] when I contacted [name] to clarify the plan at last visit in August.1.  Below is last entry in old chart:    Chronic left knee and hip/pelvic pain. Pt. last seen in May or June by Dr. Ethelene Hal. Pt. not clear why she is no longer being followed by Drs. Penni Bombard and Ramos. Called Dr. Ethelene Hal' office: Because pt. was not showing improvement with treatment, he recommended to [facility] a second opinion. She has not been seen there since June. Her knee was diagnosed with Dr. Ethelene Hal as "neuropathic pain" She is not clear what is being done about the second opinion. When asked who her contact is for [employer], pt. was not clear. Later: [name] [phone]" She had also been referred to Dr. Shelle Iron for low back pain. Not clear what the plan was with that aspect of her pain. She does not discuss that today. Is currently taking Advil 800 mg generally three times daily. She states it does not help. Was taking Naproxen with the [facility], but she states they would no longer fill. She feels this helped her more. Did not have allergic reaction to Advil or Naproxen in past. Cannot say why, but seems to have more pain in last couple of days. Does not sound like she had a new injury. She states the pain can come on like this at times.  Spoke with [name] subsequently--will need to go to old phone note to get that info and patient  started on Gabapentin 300 mg at bedtime for what Dr. Ethelene Hal felt was neuropathic pain.  Also intiated on Naproxen 500 mg twice daily.  Pt. Did start the Gabapentin at bedtime and has taken for just about a month.  Pain is now down from a 10 to a 5.  She is sleeping well now.  Maybe sleeping too much since starting medication.  Goes to bed at 9 p.m. After taking medication about 30 minutes earlier.  Up in a.m. At 10 or 11 a.m.  If sits down during day, can easily fall asleep.  Is only taking Naproxen 500 mg once daily.  Stopped taking twice daily when she was trying to clarify if some itchy rash she was having was related to her meds or something else.  Held medications for 4 days until rash went away and then restarted meds without recurrence of her rash, though did not get back to twice daily dosing of Naproxen.  2.  No period since August 27th.  States her mother went through menopause at age 46 yo.  Denies hot flashes or night sweats.  No symptoms of pregnancy.  Pt. With history of BTL in 2006.  Does state her period was more frequent until stopping in August.    3.  Health Maintenance:  Pt. Thought she had a mammogram 2 years ago--latest that can be found  is from 2012, however. Her last pap was 11/2012.  She also had a pelvic ultrasound 03/2014 for an ovarian cyst found on an MRI to work up her low back pain.  The ultrasound was normal, with no cyst seen.         Review of Systems     Objective:   Physical Exam   Pt. Appears much more comfortable today sitting in chair.  Last visit, she was writhing and moaning constantly with pain.  Still with a limp favoring her left. Lungs:  CTA CV:  RRR without murmur or rub LE:  Left knee in brace still.  Difficulty stepping up to exam bed.  Flexes to 90 degrees and fully extends.  No hair loss or skin change of lower extrem.  NOrmal DP pulses bilaterally.  LOwer extrem skin is warm bilaterally        Assessment & Plan:  1. ?Neuropathic Pain of  left leg/knee/Hip:  This is a worker's comp case.  Await second opinion.  Encouraged pt. To check in with her case manager. To increase Naproxen to twice daily.  Will return in 3 months and see if can increase frequency of Gabapentin if pain not improving.  Currently, pt. Would likely not tolerate and increase.  2.  Skipped periods:  Likely peri or menopausal.  Discussed other symptoms that may occur--hot flashes, night sweats, recurrent missed periods.  3.  Health Maintenance:  Waiting to hear when can have another flue shot clinic.  Set up for Mammogram.

## 2015-04-01 NOTE — Telephone Encounter (Signed)
[facility] her [facility] prescriptions from them. Gabapentin 300mg  one at bedtime, Atorvastatin 20mg  one tablet daily, Naproxen 500mg  1 tablet twice daily. Per Dr. Delrae AlfredMulberry okay to call Rx in over the phone. Pharmacy aware

## 2015-04-23 ENCOUNTER — Ambulatory Visit
Admission: RE | Admit: 2015-04-23 | Discharge: 2015-04-23 | Disposition: A | Discharge status: Home / Self Care | Payer: No Typology Code available for payment source | Source: Ambulatory Visit | Attending: Internal Medicine | Admitting: Internal Medicine

## 2015-04-23 DIAGNOSIS — Z0000 Encounter for general adult medical examination without abnormal findings: Secondary | ICD-10-CM

## 2015-06-26 ENCOUNTER — Ambulatory Visit (INDEPENDENT_AMBULATORY_CARE_PROVIDER_SITE_OTHER): Payer: Self-pay | Admitting: Internal Medicine

## 2015-06-26 ENCOUNTER — Encounter: Payer: Self-pay | Admitting: Internal Medicine

## 2015-06-26 VITALS — BP 112/70 | HR 68 | Ht 61.0 in | Wt 188.0 lb

## 2015-06-26 DIAGNOSIS — H547 Unspecified visual loss: Secondary | ICD-10-CM

## 2015-06-26 DIAGNOSIS — F329 Major depressive disorder, single episode, unspecified: Secondary | ICD-10-CM

## 2015-06-26 DIAGNOSIS — E785 Hyperlipidemia, unspecified: Secondary | ICD-10-CM

## 2015-06-26 DIAGNOSIS — E782 Mixed hyperlipidemia: Secondary | ICD-10-CM | POA: Insufficient documentation

## 2015-06-26 DIAGNOSIS — G8929 Other chronic pain: Secondary | ICD-10-CM | POA: Insufficient documentation

## 2015-06-26 DIAGNOSIS — F32A Depression, unspecified: Secondary | ICD-10-CM

## 2015-06-26 DIAGNOSIS — N9489 Other specified conditions associated with female genital organs and menstrual cycle: Secondary | ICD-10-CM

## 2015-06-26 MED ORDER — GABAPENTIN 100 MG PO CAPS: ORAL_CAPSULE | ORAL | Status: DC

## 2015-06-26 NOTE — Patient Instructions (Signed)
Please fill your medicines every month and take them as prescribed

## 2015-06-26 NOTE — Progress Notes (Signed)
Subjective:    Patient ID: Felicia Ray, female    DOB: 1966-03-20, 50 y.o.   MRN: [id number]  HPI  1.  ?Neuropathic pain of lft leg/knee/hip:  See long note describing course in October with this concern.  Still taking Gabapentin about 1/2 to 1 hour before at bedtime.  Goes to bed at 9 to 10 p.m. States sleeping well at night, but then is still sleeps until 11 a.m. Or noon.  Sets an alarm to wake at 6:30 a.m., but goes back to sleep after waking 4 yo daughter for school.  Her husband takes their daughter to school. Has had some improvement in her pain, but still with bad days. Has appt. For a second opinion with the Worker's Comp in [place], [place] with [facility].  Cannot recall the name of the physician she will see.    2.  Hyperlipidemia:  Pt. Gives history of taking her last Atorvastatin tab today.  Patient had her cholesterol checked at [facility] when she was inadvertently switched to their care with orange card.  Called [facility] pharmacy and patient has only filled Rx once and has not been back since October, so sounds like not taking daily as recommended.  3.  Bilateral Lower quadrant pain:  Has had for 2 months.  Has the pain each month starting with her periods.  The pain is much worse while she is menstruating, which generally last 5 days.  She has also noted heavier bleeding with periods in last 2 months.  The pain is much less, but lasts for about 5 days after her period flow stops.  Has not had this pain before. At last visit in October, pt. Had not had a period in 2 months.  Denies hot flashes or night sweats.   No vaginal discharge or odor.  4.  Blurry Vision:  Sounds like tried one pair of reading glasses at pharmacy, but did not try other lens strengths.  Describes needing more light and holding things far from her face to adequately focus.          Review of Systems     Objective:   Physical Exam NAD Tearful when ask a few questions about depression.  Did  not want to discuss with 45 yo daughter in room.  Chest:  CTA CV:  RRR without murmur or rub Abd:  Obese, Diffusely tender with reportedly more tenderness in LLQ and RLQ areas. Moans loudly with abdominal examination.   Her abdomen, however, remains soft throughout exam in all quadrant.  No rebound or peritoneal signs.  No HSM or masses appreciated. Pelvic:  Normal external genitalia.  Pt. Pulls away and moans with minimal pressure in both vaginal opening and over uterus and adnexa with bimanual palpation.  No uterine or adnexal mass appreciated.         Assessment & Plan:  1.  Chronic pain of left knee/hip/back:  Getting second opinion.  Has been on gabapentin long enough that would expect this sleepiness side effect to decrease and has not.  Decrease gabapentin dose from 300 mg to 100 mg. LATER:  Call back from [facility] pharmacy.  Pt. Has not filled this medication since mid October.  Discussed with patient need to obtain lower dose and take daily for her body to overcome sedation side effect.  2.  Bilateral pelvic pain:  Not clear why she is having such extreme pain.  No concerning findings on exam.  Check CBC with other fasting labs in  12 months.  3.  Probable Presbyopia:  To try different lens strengths at a pharmacy while awaits eye referral.  4.  Depression:  Did not want 169 yo daughter to interpret concerns with depression.  PHQ-9 score of 22.  Nilda SimmerNatosha Knight, LCSW in with patient for consultation currently.  Will  discuss plan for treatment subsequently.  5.  Hyperlipidemia:  To take Atorvastatin daily for 2 months and return for FLP, CMP

## 2015-07-02 ENCOUNTER — Other Ambulatory Visit (INDEPENDENT_AMBULATORY_CARE_PROVIDER_SITE_OTHER): Payer: Self-pay | Admitting: Licensed Clinical Social Worker

## 2015-07-02 DIAGNOSIS — F32A Depression, unspecified: Secondary | ICD-10-CM

## 2015-07-02 DIAGNOSIS — F329 Major depressive disorder, single episode, unspecified: Secondary | ICD-10-CM

## 2015-07-02 NOTE — Progress Notes (Signed)
   THERAPY PROGRESS NOTE  Session Time:  Participation Level: Active  Behavioral Response: Casual and Fairly GroomedAlertDepressed and Hopeless  Type of Therapy: Individual Therapy  Treatment Goals addressed: Coping  Interventions: Supportive  Summary: Felicia Ray is a 50 y.o. female who presents with a depressed mood and appropriate affect. She reported that she was seeking counseling due to depression and pain in her knees, hips, and back. Felicia Ray shared that her 28-year-old daughter is also starting therapy after writing a note in school in December 2016 saying that she wanted to kill herself. She reported that her daughter seems angry, pushes her, throws things, and cries often. Felicia Ray expressed her pain and guilt that her daughter is going through such a difficult time. She reported that her challenges started in August 2015, when she slipped on water at work and took a hard fall. Since then, she has been in a lot of pain, walked on crutches, received some physical therapy, and now walks with a cane. She shared about her frustration that she cannot do things that she used to love to do. Felicia Ray shared that she would like to go back to work, as she is experiencing significant financial strain with only her Dance movement psychotherapist. She reported that she and her partner are having serious relationship problems and may not be able to stay together long-term. Felicia Ray shared that being in pain and not being able to do the things that she wants have resulted in a serious depression and some suicidal thoughts. She reported that she "just wants to sleep and never wake up." She denied any intent to harm herself, saying that "they're only thoughts." She denied a plan. She reported that she would not hurt herself because her daughter needs her.    Suicidal/Homicidal: Yeswithout intent/plan  Therapist Response: LCSW utilized supportive counseling techniques throughout the session in order to build rapport and  validate [name]'s feelings. LCSW began the clinical assessment but was not able to complete it due to time constraints. LCSW inquired about [name]'s family of origin and current nuclear family. LCSW and Felicia Ray processed about her depressive symptoms and their impact on her life. LCSW encouraged Felicia Ray to speak with the doctor regarding the possibility of an antidepressant.  Plan: Return again in 2 weeks.  Diagnosis: Axis I: Depressive Disorder NOS    Axis II: No diagnosis    [name], LCSW 07/02/2015

## 2015-07-11 ENCOUNTER — Telehealth: Payer: Self-pay | Admitting: Internal Medicine

## 2015-07-11 NOTE — Telephone Encounter (Signed)
Patient called ans states she was seen by Dr. Gerlene Fee at the Orthopaedics office and she was told that Dr. Jillyn Hidden would send a progress  note to Dr. Delrae Alfred so PCP could prescribe her the new medication( patient does not know medication name) she has to take that will help her with her pain. Patient wants to know if Dr. Delrae Alfred received the note and if she needs to come in for a visit in order to get medication. Pease advise

## 2015-07-16 ENCOUNTER — Other Ambulatory Visit (INDEPENDENT_AMBULATORY_CARE_PROVIDER_SITE_OTHER): Payer: Self-pay | Admitting: Licensed Clinical Social Worker

## 2015-07-16 DIAGNOSIS — F32A Depression, unspecified: Secondary | ICD-10-CM

## 2015-07-16 DIAGNOSIS — F329 Major depressive disorder, single episode, unspecified: Secondary | ICD-10-CM

## 2015-07-17 NOTE — Progress Notes (Signed)
   THERAPY PROGRESS NOTE  Session Time:  Participation Level: Active  Behavioral Response: Casual and Fairly GroomedAlertDepressed  Type of Therapy: Individual Therapy  Treatment Goals addressed: Coping  Interventions: Supportive  Summary: Felicia Ray is a 50 y.o. female who presents with a depressed mood and appropriate affect. She reported that her back pain and knee pain continue to limit her ability to walk and work. She shared that she has been feeling "desperate" to work again because staying in the house all day increases her level of depression. 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 She shared that her plan prior to her work accident was to save money and return to [place] in the next few years. Felicia Ray and LCSW brainstormed about the kinds of jobs that she might be able to do while she continues to recover from her work injury, including Oceanographer, cooking for events, working at a [employer], or Sales promotion account executive at the [employer]. Felicia Ray shared that she continues to worry about her daughter but that things seem to be somewhat better with her daughter now that she is in therapy. Felicia Ray expressed concerns that her depression has such a big impact on her daughter.   Suicidal/Homicidal: Nowithout intent/plan  Therapist Response: LCSW used supportive counseling techniques throughout the session. LCSW and Felicia Ray processed at length about what kinds of jobs she might be able to find, even with limited walking ability. LCSW and Felicia Ray discussed the importance of work for her financially as well as feeling useful. LCSW emphasized that Felicia Ray is very important and useful to her daughter, even with limited mobility.   Plan: Return again in 2 weeks.  Diagnosis: Axis I: Depressive Disorder NOS    Axis II: No diagnosis    [name], LCSW 07/17/2015

## 2015-07-19 NOTE — Telephone Encounter (Signed)
Patient will call [name] office to see what is going on with the notes he was suppose to have sent already so new  Medication can be prescribed for patient's pain. Patient to call us back after contacting them. Patient was instructed by [name] to stop the Naproxen because that could harm her stomach since she has been taking it for a long time.

## 2015-07-26 ENCOUNTER — Other Ambulatory Visit: Payer: Self-pay | Admitting: Internal Medicine

## 2015-07-26 DIAGNOSIS — G8929 Other chronic pain: Secondary | ICD-10-CM

## 2015-07-26 MED ORDER — GABAPENTIN 100 MG PO CAPS
ORAL_CAPSULE | ORAL | Status: DC
Start: 2015-07-26 — End: 2015-07-26

## 2015-07-26 MED ORDER — GABAPENTIN 100 MG PO CAPS: ORAL_CAPSULE | ORAL | Status: DC

## 2015-07-26 NOTE — Telephone Encounter (Signed)
Patient called again today to verify if the Dr. Jillyn Hidden Smoot's had sent the progress note to Dr. Delrae Alfred. We have not received it yet. Patient states she spoke to her lawyer whom is in touch with her Doctor and was told that the note had already been sent to Dr. Delrae Alfred with all the information and the new medication patient needs to start taking. I was able to contact [name] office-[address] [place], [place] [address] Telephone: [phone]  and spoke to [place]. Patient was seen at the office for a second opinion only and will not continue care with them. Patient needs to sign a Medical Record Release to be fax [phone])  over to their clinic so records can be release. Called patient to let her know about the Medical Record Release she needs to sign and she states she is waiting to hear back from her lawyer and she will call us back

## 2015-07-26 NOTE — Progress Notes (Signed)
Patient contacted and informed on how she needs to start increasing her Gabapentin and is aware that she is not to take Naproxen anymore.Patient will go get medication on Monday 07/29/15. Rx faxed to [facility] pharmacy.  Patient has a follow up appointment on 08/29/15 @ 11:30 AM

## 2015-08-05 ENCOUNTER — Telehealth: Payer: Self-pay | Admitting: Licensed Clinical Social Worker

## 2015-08-05 NOTE — Telephone Encounter (Signed)
Pt contacted LCSW to share that she does not want to continue counseling at this time because of lack of funds and needing to spend her time seeking employment.

## 2015-08-06 ENCOUNTER — Other Ambulatory Visit: Payer: Self-pay | Admitting: Licensed Clinical Social Worker

## 2015-08-22 ENCOUNTER — Ambulatory Visit: Payer: Self-pay | Admitting: Internal Medicine

## 2015-08-22 ENCOUNTER — Telehealth: Payer: Self-pay | Admitting: Internal Medicine

## 2015-08-22 NOTE — Telephone Encounter (Signed)
Spoke with Pharmacy patient picked up Rx on 07/29/15 for #30 and on 3//17 #30. Felicia Ray spoke with patient she is taking dose as Dr. Delrae AlfredMulberry prescribe 2 in the  Am and 3 in the pm.  I called and left message with [facility] to [facility] her prescription as prescribed for the #270 monthly

## 2015-08-22 NOTE — Telephone Encounter (Signed)
Olegario MessierKathy from the [facility] left a voice message about patient Felicia BellMaria Ferra Ray's Gabapentin medication. Please call [phone] to speak to [facility].

## 2015-08-23 ENCOUNTER — Other Ambulatory Visit (INDEPENDENT_AMBULATORY_CARE_PROVIDER_SITE_OTHER): Payer: Self-pay

## 2015-08-23 DIAGNOSIS — E785 Hyperlipidemia, unspecified: Secondary | ICD-10-CM

## 2015-08-24 LAB — CBC WITH DIFFERENTIAL/PLATELET
BASOS ABS: 0 10*3/uL (ref 0.0–0.2)
Basos: 0 %
EOS (ABSOLUTE): 0.1 10*3/uL (ref 0.0–0.4)
Eos: 2 %
HEMOGLOBIN: 13.9 g/dL (ref 11.1–15.9)
Hematocrit: 42.5 % (ref 34.0–46.6)
IMMATURE GRANS (ABS): 0 10*3/uL (ref 0.0–0.1)
Immature Granulocytes: 0 %
LYMPHS: 22 %
Lymphocytes Absolute: 1.6 10*3/uL (ref 0.7–3.1)
MCH: 29.8 pg (ref 26.6–33.0)
MCHC: 32.7 g/dL (ref 31.5–35.7)
MCV: 91 fL (ref 79–97)
MONOCYTES: 8 %
Monocytes Absolute: 0.6 10*3/uL (ref 0.1–0.9)
NEUTROS ABS: 4.7 10*3/uL (ref 1.4–7.0)
Neutrophils: 68 %
PLATELETS: 238 10*3/uL (ref 150–379)
RBC: 4.67 x10E6/uL (ref 3.77–5.28)
RDW: 14.2 % (ref 12.3–15.4)
WBC: 7 10*3/uL (ref 3.4–10.8)

## 2015-08-24 LAB — COMPREHENSIVE METABOLIC PANEL
A/G RATIO: 1.5 (ref 1.1–2.5)
ALBUMIN: 4.1 g/dL (ref 3.5–5.5)
ALK PHOS: 76 IU/L (ref 39–117)
ALT: 11 IU/L (ref 0–32)
AST: 10 IU/L (ref 0–40)
BILIRUBIN TOTAL: 0.6 mg/dL (ref 0.0–1.2)
BUN / CREAT RATIO: 14 (ref 9–23)
BUN: 8 mg/dL (ref 6–24)
CO2: 22 mmol/L (ref 18–29)
Calcium: 8.6 mg/dL — ABNORMAL LOW (ref 8.7–10.2)
Chloride: 100 mmol/L (ref 96–106)
Creatinine, Ser: 0.59 mg/dL (ref 0.57–1.00)
GFR calc Af Amer: 126 mL/min/{1.73_m2} (ref >59)
GFR calc non Af Amer: 109 mL/min/{1.73_m2} (ref >59)
GLOBULIN, TOTAL: 2.7 g/dL (ref 1.5–4.5)
GLUCOSE: 89 mg/dL (ref 65–99)
Potassium: 4.5 mmol/L (ref 3.5–5.2)
SODIUM: 138 mmol/L (ref 134–144)
Total Protein: 6.8 g/dL (ref 6.0–8.5)

## 2015-08-24 LAB — LIPID PANEL W/O CHOL/HDL RATIO
Cholesterol, Total: 152 mg/dL (ref 100–199)
HDL: 26 mg/dL — ABNORMAL LOW (ref >39)
LDL CALC: 65 mg/dL (ref 0–99)
Triglycerides: 305 mg/dL — ABNORMAL HIGH (ref 0–149)
VLDL CHOLESTEROL CAL: 61 mg/dL — AB (ref 5–40)

## 2015-08-29 ENCOUNTER — Ambulatory Visit: Payer: Self-pay | Admitting: Internal Medicine

## 2015-10-10 ENCOUNTER — Ambulatory Visit (INDEPENDENT_AMBULATORY_CARE_PROVIDER_SITE_OTHER): Payer: Self-pay | Admitting: Internal Medicine

## 2015-10-10 ENCOUNTER — Encounter: Payer: Self-pay | Admitting: Internal Medicine

## 2015-10-10 VITALS — BP 120/78 | HR 80 | Ht 61.0 in | Wt 185.0 lb

## 2015-10-10 DIAGNOSIS — F329 Major depressive disorder, single episode, unspecified: Secondary | ICD-10-CM

## 2015-10-10 DIAGNOSIS — E785 Hyperlipidemia, unspecified: Secondary | ICD-10-CM

## 2015-10-10 DIAGNOSIS — F32A Depression, unspecified: Secondary | ICD-10-CM

## 2015-10-10 DIAGNOSIS — G8929 Other chronic pain: Secondary | ICD-10-CM

## 2015-10-10 NOTE — Progress Notes (Signed)
   Subjective:    Patient ID: Felicia Ray, female    DOB: 08-Dec-1965, 50 y.o.   MRN: [id number]  HPI  1.  Chronic pain in left hip and knee:  Has titrated Gabapentin up to 200 mg in the morning and 300 mg in the evening.  If she stays busy, she does not get sleepy.  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  Does not get many hours, but is helping her.   Is sleeping fine without hangover as before.   [place] and is using a bicycle as well for exercise.    Is walking and volunteering at her daughter's school.   Feels things turned around with [facility]. Really only has significant pain when reaches up really high above her head, so she avoids this movement.     2.  Hyperlipidemia:  Discussed high triglycerides and low HDL.  Patient has only picked up her physical activity in past 3 weeks, subsequent to her recent FLP.      Review of Systems     Objective:   Physical Exam  Appears happy for the first time in a long time. LUngs:  CTA CV:  RRR without murmur or rub, radial pulses normal and equal. Uses cane, but moves around much more smoothly. No groaning in pain while sitting as before as well.       Assessment & Plan:  1.  Chronic Pain:  Looks great and feels so much better. [facility] and both during great. Filled out disabled parking permit for [employer] for 6 months.  Continue counseling with Lesia SagoPura Lopez and Gabapentin.  2. Hyperlipidemia:  Will see how she does with increased physical activity and recheck FLP again in 3-4 months

## 2015-11-22 ENCOUNTER — Encounter: Payer: Self-pay | Admitting: Internal Medicine

## 2015-11-22 ENCOUNTER — Ambulatory Visit (INDEPENDENT_AMBULATORY_CARE_PROVIDER_SITE_OTHER): Payer: Self-pay | Admitting: Internal Medicine

## 2015-11-22 VITALS — BP 100/70 | HR 68 | Temp 98.6°F | Resp 16 | Ht 59.75 in | Wt 183.0 lb

## 2015-11-22 DIAGNOSIS — L299 Pruritus, unspecified: Secondary | ICD-10-CM

## 2015-11-22 MED ORDER — TRIAMCINOLONE ACETONIDE 0.1 % EX CREA: TOPICAL_CREAM | CUTANEOUS | Status: DC

## 2015-11-22 NOTE — Patient Instructions (Addendum)
Eucerin cream for eczema relief dos veces al dia Tome GAbapentin Add medicines back otherwise only after itching and rash gone--1 at a time every week

## 2015-11-22 NOTE — Progress Notes (Signed)
   Subjective:    Patient ID: Felicia Ray, female    DOB: 03-Sep-1965, [age] y.o.   MRN: [id number]  HPI  1. Pruritic Rash started just under 2 weeks ago.  Describes small red bumps starting on anterior medial thighs down to just above ankles.  Does recall in the days preceding the rash, being outside in shorts, but does not recall going out in grass or yard.   Does use a lotion (something with menthol that is for varicose veins) for her legs, but has used for years.  Stopped 2 days after rash started.   No sunscreen used. She does have a little dog that goes out into the grass/yard.  The dog does not sit on her lap, but does brush up against legs. Has been applying alcohol and what she calls "rue"  Which appears to be some sort of algae like plant.  She feels this mix calms her itching.   Using Benadryl orally and topically. Stopped all of her meds 2 days ago, including Gabapentin, to see if rash improved.    Current outpatient prescriptions:  .  atorvastatin (LIPITOR) 20 MG tablet, Take 1 tablet (20 mg total) by mouth daily., Disp: 30 tablet, Rfl: 11 .  gabapentin (NEURONTIN) 100 MG capsule, 2 caps by mouth at bedtime.  In 1 week, increase to 3 caps daily at bedtime.  In another week, add a morning dose of 1 cap in the morning and continue 3 caps at bedtime.  In another week, increase to 2 caps in the morning and 3 caps at night, Disp: 270 capsule, Rfl: 1 .  niacin 500 MG tablet, Take 500 mg by mouth 2 (two) times daily., Disp: , Rfl:  .  Omega 3 1200 MG CAPS, Take 1 capsule by mouth 2 (two) times daily., Disp: , Rfl:  .  Cholecalciferol (VITAMIN D3) 2000 UNITS TABS, Take 2,000 capsules by mouth daily. Reported on 11/22/2015, Disp: , Rfl:  .  naproxen (EC NAPROSYN) 500 MG EC tablet, Take 1 tablet (500 mg total) by mouth 2 (two) times daily. (Patient not taking: Reported on 10/10/2015), Disp: 60 tablet, Rfl: 4   Allergies  Allergen Reactions  . Aspirin Other (See Comments)    Cant breath  .  Tylenol [Acetaminophen] Other (See Comments)    Reaction unknown        Review of Systems     Objective:   Physical Exam  No jaundice of sclera or skin. Pt. With scratch marks and tiny scabs scattered on anterior thighs to anterior tibial areas bilaterally.  Follicles in same area appear a bit raised and dry. Initially states no other involvement, but found reddened scratch marks on bilateral back, right lower abdomen and right wrist and forearm. No obvious rash in any of these areas       Assessment & Plan:  Suspect dry skin with repeated scratching Stop the alcohol rubs Avoid hot showers Triamcinolone cream 0.1 % twice daily just to areas of itching. Eucerin cream with eczema relief twice daily all over. Restart Gabapentin. If no improvement in 1-2 weeks, return to clinic. If resolution, restart Atorvastatin first, then her supplements one by one, one week apart

## 2016-02-12 ENCOUNTER — Other Ambulatory Visit (INDEPENDENT_AMBULATORY_CARE_PROVIDER_SITE_OTHER): Payer: Self-pay

## 2016-02-12 DIAGNOSIS — E785 Hyperlipidemia, unspecified: Secondary | ICD-10-CM

## 2016-02-13 LAB — LIPID PANEL W/O CHOL/HDL RATIO
Cholesterol, Total: 150 mg/dL (ref 100–199)
HDL: 24 mg/dL — ABNORMAL LOW (ref >39)
Triglycerides: 438 mg/dL — ABNORMAL HIGH (ref 0–149)

## 2016-02-20 ENCOUNTER — Ambulatory Visit (INDEPENDENT_AMBULATORY_CARE_PROVIDER_SITE_OTHER): Payer: Self-pay | Admitting: Internal Medicine

## 2016-02-20 ENCOUNTER — Encounter: Payer: Self-pay | Admitting: Internal Medicine

## 2016-02-20 VITALS — BP 100/60 | HR 90 | Temp 98.3°F | Resp 16 | Ht 60.0 in | Wt 182.0 lb

## 2016-02-20 DIAGNOSIS — R0789 Other chest pain: Secondary | ICD-10-CM

## 2016-02-20 DIAGNOSIS — G8929 Other chronic pain: Secondary | ICD-10-CM

## 2016-02-20 DIAGNOSIS — E782 Mixed hyperlipidemia: Secondary | ICD-10-CM

## 2016-02-20 DIAGNOSIS — G894 Chronic pain syndrome: Secondary | ICD-10-CM

## 2016-02-20 DIAGNOSIS — Z23 Encounter for immunization: Secondary | ICD-10-CM

## 2016-02-20 DIAGNOSIS — R21 Rash and other nonspecific skin eruption: Secondary | ICD-10-CM

## 2016-02-20 MED ORDER — GABAPENTIN 100 MG PO CAPS: ORAL_CAPSULE | ORAL | 11 refills | Status: DC

## 2016-02-20 NOTE — Progress Notes (Signed)
   Subjective:    Patient ID: Felicia Ray, female    DOB: 10-28-1965, 50 y.o.   MRN: [id number]  HPI   1.  Rash:  Resolved.  Restarted all her meds, but ran out of Gabapentin she thinks in July.  2.  Chronic pain from left knee/hip:  Has not really felt like she needs the Gabapentin.  3.  Complains of poor sleep due to neck, left shoulder and chest discomfort.  Has to get up, heats of milk with honey, puts on eye covers and able to fall asleep.  Pt. Does admit she ran out of Gabapentin about the same time she started having this discomfort and trouble sleeping.  Was taking twice daily, 200 mg with bedtime dose. No swelling of ankles.  Sleeps on 1 pillow and ultimately able to fall to sleep No chest pain or dyspnea any other time of day, even with exertion.  4.  Hyperlipidemia:  Is taking Fish Oil 2,000 mg daily since January and Atorvastatin 20 mg since October of 2016.  States she does not miss.  Feels she is more physically active and eating ok.  Discussed her triglycerides and HDL, however, are worsening.    Current Meds  Medication Sig  . atorvastatin (LIPITOR) 20 MG tablet Take 1 tablet (20 mg total) by mouth daily.  Marland Kitchen. gabapentin (NEURONTIN) 100 MG capsule 2 caps by mouth in morning and bedtime  . niacin 500 MG tablet Take 500 mg by mouth 2 (two) times daily.  . Omega 3 1200 MG CAPS Take 1 capsule by mouth 2 (two) times daily.  . [DISCONTINUED] gabapentin (NEURONTIN) 100 MG capsule 2 caps by mouth at bedtime.  In 1 week, increase to 3 caps daily at bedtime.  In another week, add a morning dose of 1 cap in the morning and continue 3 caps at bedtime.  In another week, increase to 2 caps in the morning and 3 caps at night (Patient taking differently: Take 100 mg by mouth 2 (two) times daily. Patient taking 2 tabs in the morning and 2 tabs at night.)    Allergies  Allergen Reactions  . Aspirin Other (See Comments)    Cant breath  . Tylenol [Acetaminophen] Other (See Comments)   Reaction unknown      Review of Systems     Objective:   Physical Exam  Favors left leg when walking to exam table, but sits with more comfort. Lungs:  CTA, tender over anterior and posterior left chest wall. CV:  RRR without murmur or rub, radial pulses normal and equal LE: No edema        Assessment & Plan:  1.  Rash:  Unknown etiology:  Resolved  2.  Chronic pain of left hip/knee and now with neck/chest discomfort with poor sleep.  Restart Gabapentin and titrate to 200 mg twice daily as previously.    3.  Insomnia: Call if continued problem with #2 and #3 with readdition of Gabapentin.  4. Hyperlipidemia:  Increase Fish oil caps to 2,000 mg twice daily.  FLP and hepatic profile in 2 months followed by OV with me.

## 2016-04-08 ENCOUNTER — Telehealth: Payer: Self-pay | Admitting: Internal Medicine

## 2016-04-08 NOTE — Telephone Encounter (Signed)
Patient was notified by pharmacy that Rx is expired.  Patient needs Rx refill.  Atorvastatin 40 mg.  [facility].  [phone].

## 2016-04-09 ENCOUNTER — Other Ambulatory Visit: Payer: Self-pay | Admitting: Internal Medicine

## 2016-04-09 MED ORDER — ATORVASTATIN CALCIUM 20 MG PO TABS: 20.0000 mg | ORAL_TABLET | Freq: Every day | ORAL | 11 refills | Status: DC

## 2016-04-10 NOTE — Telephone Encounter (Signed)
Taken care of 2 days ago

## 2016-04-24 ENCOUNTER — Other Ambulatory Visit: Payer: Self-pay

## 2016-04-24 ENCOUNTER — Other Ambulatory Visit (INDEPENDENT_AMBULATORY_CARE_PROVIDER_SITE_OTHER): Payer: Self-pay

## 2016-04-24 DIAGNOSIS — E782 Mixed hyperlipidemia: Secondary | ICD-10-CM

## 2016-04-24 NOTE — Progress Notes (Signed)
Here for FLP

## 2016-04-25 LAB — LIPID PANEL W/O CHOL/HDL RATIO
Cholesterol, Total: 160 mg/dL (ref 100–199)
HDL: 35 mg/dL — AB (ref >39)
LDL Calculated: 84 mg/dL (ref 0–99)
TRIGLYCERIDES: 205 mg/dL — AB (ref 0–149)
VLDL CHOLESTEROL CAL: 41 mg/dL — AB (ref 5–40)

## 2016-05-01 ENCOUNTER — Encounter: Payer: Self-pay | Admitting: Internal Medicine

## 2016-05-01 ENCOUNTER — Ambulatory Visit (INDEPENDENT_AMBULATORY_CARE_PROVIDER_SITE_OTHER): Payer: Self-pay | Admitting: Internal Medicine

## 2016-05-01 VITALS — BP 98/68 | HR 78 | Resp 12 | Ht 59.0 in | Wt 179.0 lb

## 2016-05-01 DIAGNOSIS — B351 Tinea unguium: Secondary | ICD-10-CM | POA: Insufficient documentation

## 2016-05-01 DIAGNOSIS — E782 Mixed hyperlipidemia: Secondary | ICD-10-CM

## 2016-05-01 MED ORDER — TERBINAFINE HCL 250 MG PO TABS: 250.0000 mg | ORAL_TABLET | Freq: Every day | ORAL | 0 refills | Status: DC

## 2016-05-01 NOTE — Progress Notes (Signed)
   Subjective:    Patient ID: Felicia Ray, female    DOB: [birth year], 50 y.o.   MRN: [id number]  HPI   Dyslipidemia: Discussed significant improvement with lipid panel Lipid Panel     Component Value Date/Time   CHOL 160 04/24/2016 1117   TRIG 205 (H) 04/24/2016 1117   HDL 35 (L) 04/24/2016 1117   LDLCALC 84 04/24/2016 1117   More than halved triglycerides and HDL up 11 points.  States has increased physical activity and has changed her diet.  Did increase her fish oil 2,000 mg 3 times daily. Long discussion regarding diet and physical activity.   Daughter doing this with her  2.  Has discolored, thickened toenails she would like to have treated.     Current Meds  Medication Sig  . atorvastatin (LIPITOR) 20 MG tablet Take 1 tablet (20 mg total) by mouth daily.  Marland Kitchen. gabapentin (NEURONTIN) 100 MG capsule 2 caps by mouth in morning and bedtime  . niacin 500 MG tablet Take 500 mg by mouth 2 (two) times daily.  . Omega 3 1200 MG CAPS Take 1 capsule by mouth 2 (two) times daily.   Allergies  Allergen Reactions  . Aspirin Other (See Comments)    Cant breath  . Tylenol [Acetaminophen] Other (See Comments)    Reaction unknown      Review of Systems     Objective:   Physical Exam NAD Lungs:  CTA CV:  RRR without murmur or rub Abd:  S, NT, No HSM or mass, + BS Toenails:  Multiple toenails thickened and discolored, crumblinv       Assessment & Plan:  1.  Mixed Hyperlipidemia:  Improved with lifestyle changes, Atorvastatin and Fish oil caps.  Encouraged continued work on lifestyle changes.  2.  Toenail onychomycosis:  Discussed shoe and shower stall care.  Start 90 day course of Terbinafine. LFTs in 6 weeks.  Baseline normal in recent months.

## 2016-05-01 NOTE — Patient Instructions (Signed)
Spray inside of all shoes and allow to dry when first start Terbinafine Spray inside of shoes after each use and allow to dry before wearing again Clean shower stall twice weekly with cleaner containing bleach Return in 6 weeks for recheck of blood Return in 12 weeks to see Felicia Ray and recheck cholesterol and liver labs

## 2016-06-12 ENCOUNTER — Other Ambulatory Visit (INDEPENDENT_AMBULATORY_CARE_PROVIDER_SITE_OTHER): Payer: Self-pay

## 2016-06-12 DIAGNOSIS — Z79899 Other long term (current) drug therapy: Secondary | ICD-10-CM

## 2016-06-13 LAB — HEPATIC FUNCTION PANEL
ALT: 18 IU/L (ref 0–32)
AST: 18 IU/L (ref 0–40)
Albumin: 4.6 g/dL (ref 3.5–5.5)
Alkaline Phosphatase: 68 IU/L (ref 39–117)
Bilirubin Total: 0.3 mg/dL (ref 0.0–1.2)
Bilirubin, Direct: 0.07 mg/dL (ref 0.00–0.40)
Total Protein: 7.2 g/dL (ref 6.0–8.5)

## 2016-06-17 ENCOUNTER — Other Ambulatory Visit: Payer: Self-pay

## 2016-06-19 NOTE — Progress Notes (Signed)
Felicia Ray called patient and read results and were understood by patient. Reminded her of her upcoming appointment in February.

## 2016-06-23 ENCOUNTER — Ambulatory Visit (INDEPENDENT_AMBULATORY_CARE_PROVIDER_SITE_OTHER): Payer: Self-pay | Admitting: Internal Medicine

## 2016-06-23 ENCOUNTER — Encounter: Payer: Self-pay | Admitting: Internal Medicine

## 2016-06-23 VITALS — BP 118/78 | HR 74 | Temp 97.5°F | Resp 12 | Ht 59.0 in | Wt 175.0 lb

## 2016-06-23 DIAGNOSIS — B349 Viral infection, unspecified: Secondary | ICD-10-CM

## 2016-06-23 DIAGNOSIS — J029 Acute pharyngitis, unspecified: Secondary | ICD-10-CM

## 2016-06-23 LAB — POCT RAPID STREP A (OFFICE): Rapid Strep A Screen: NEGATIVE

## 2016-06-23 MED ORDER — PROMETHAZINE HCL 25 MG PO TABS: 25.0000 mg | ORAL_TABLET | Freq: Four times a day (QID) | ORAL | 0 refills | Status: DC | PRN

## 2016-06-23 NOTE — Progress Notes (Signed)
   Subjective:    Patient ID: Felicia Ray, female    DOB: Mar 27, 1966, 51 y.o.   MRN: [id number]  HPI   Ill for 4 days.  Started with chills and headache, fever up to 100 F axillary.  + Body aches.   Has developed cough, burning throat.  Has now developed chest pain from her coughing.   Cough is nonproductive.    Maybe feels a bit short of breath Has been taking Theraflu with a little bit of improvement.  Has been taking Naproxen 500 mg three times daily as well with some help.   No one around her she is aware of have been ill--work or home. Took Theraflu last at 7 a.m. This morning.  Last does of Naproxen 9 pm last night.  Current Meds  Medication Sig  . atorvastatin (LIPITOR) 20 MG tablet Take 1 tablet (20 mg total) by mouth daily.  Marland Kitchen. gabapentin (NEURONTIN) 100 MG capsule 2 caps by mouth in morning and bedtime  . naproxen (EC NAPROSYN) 500 MG EC tablet Take 1 tablet (500 mg total) by mouth 2 (two) times daily.  . niacin 500 MG tablet Take 500 mg by mouth 2 (two) times daily.  . Omega 3 1200 MG CAPS Take 1 capsule by mouth 2 (two) times daily.   Allergies  Allergen Reactions  . Aspirin Other (See Comments)    Cant breath     Review of Systems     Objective:   Physical Exam  Appears moderately ill--shivering HEENT:  PERRL, EOMI, TMs pearly gray, throat with injection, but no exudate.   Neck:  Supple, shotty anterior cervical nodes--tender Chest: CTA CV:  RRR without murmur or rub, radial pulses normal and equal Abd:  S, NT, No HSM or mass + BS Skin:  No rashe  Rapid Strep:  negative      Assessment & Plan:  Viral syndrome:  Possibly flu, but too late for Tamiflu.  Did receive vaccine Supportive care--see discharge info To call if worsens at any time or no better in 48 hours.

## 2016-06-23 NOTE — Patient Instructions (Signed)
Agua, gatorade, Sprite freqentemente   Ibuprofen 200 mg 3-4 pastillas cada 6 horas con comida (saltines) a necesita  No Naproxen si tome Ibuprofen en lo mismo dia Nyquil o Therflu habla clinica si mas mal o no mejor in 2 dias

## 2016-07-24 ENCOUNTER — Other Ambulatory Visit (INDEPENDENT_AMBULATORY_CARE_PROVIDER_SITE_OTHER): Payer: Self-pay

## 2016-07-24 DIAGNOSIS — Z79899 Other long term (current) drug therapy: Secondary | ICD-10-CM

## 2016-07-24 DIAGNOSIS — E782 Mixed hyperlipidemia: Secondary | ICD-10-CM

## 2016-07-25 LAB — COMPREHENSIVE METABOLIC PANEL
ALBUMIN: 4.5 g/dL (ref 3.5–5.5)
ALT: 13 IU/L (ref 0–32)
AST: 15 IU/L (ref 0–40)
Albumin/Globulin Ratio: 1.7 (ref 1.2–2.2)
Alkaline Phosphatase: 76 IU/L (ref 39–117)
BUN / CREAT RATIO: 13 (ref 9–23)
BUN: 9 mg/dL (ref 6–24)
Bilirubin Total: 0.4 mg/dL (ref 0.0–1.2)
CALCIUM: 9.2 mg/dL (ref 8.7–10.2)
CHLORIDE: 99 mmol/L (ref 96–106)
CO2: 25 mmol/L (ref 18–29)
CREATININE: 0.69 mg/dL (ref 0.57–1.00)
GFR, EST AFRICAN AMERICAN: 119 mL/min/{1.73_m2} (ref >59)
GFR, EST NON AFRICAN AMERICAN: 103 mL/min/{1.73_m2} (ref >59)
GLUCOSE: 96 mg/dL (ref 65–99)
Globulin, Total: 2.7 g/dL (ref 1.5–4.5)
Potassium: 4.6 mmol/L (ref 3.5–5.2)
Sodium: 138 mmol/L (ref 134–144)
TOTAL PROTEIN: 7.2 g/dL (ref 6.0–8.5)

## 2016-07-25 LAB — LIPID PANEL W/O CHOL/HDL RATIO
Cholesterol, Total: 144 mg/dL (ref 100–199)
HDL: 31 mg/dL — AB (ref >39)
LDL Calculated: 54 mg/dL (ref 0–99)
Triglycerides: 297 mg/dL — ABNORMAL HIGH (ref 0–149)
VLDL Cholesterol Cal: 59 mg/dL — ABNORMAL HIGH (ref 5–40)

## 2016-07-25 LAB — HEPATIC FUNCTION PANEL: BILIRUBIN, DIRECT: 0.11 mg/dL (ref 0.00–0.40)

## 2016-08-12 NOTE — Progress Notes (Signed)
Patient called and was given her lab results. She says her nails show no improvement and she is on her last bottle of medication.

## 2016-08-12 NOTE — Progress Notes (Signed)
Patient was called and left a voicemail. Will give her results when she calls back, or try again later.

## 2016-08-26 ENCOUNTER — Emergency Department (HOSPITAL_BASED_OUTPATIENT_CLINIC_OR_DEPARTMENT_OTHER): Payer: No Typology Code available for payment source

## 2016-08-26 ENCOUNTER — Encounter (HOSPITAL_BASED_OUTPATIENT_CLINIC_OR_DEPARTMENT_OTHER): Payer: Self-pay | Admitting: *Deleted

## 2016-08-26 ENCOUNTER — Emergency Department (HOSPITAL_BASED_OUTPATIENT_CLINIC_OR_DEPARTMENT_OTHER)
Admission: EM | Admit: 2016-08-26 | Discharge: 2016-08-26 | Disposition: A | Discharge status: Home / Self Care | Payer: No Typology Code available for payment source | Attending: Emergency Medicine | Admitting: Emergency Medicine

## 2016-08-26 DIAGNOSIS — Y9389 Activity, other specified: Secondary | ICD-10-CM | POA: Insufficient documentation

## 2016-08-26 DIAGNOSIS — Y999 Unspecified external cause status: Secondary | ICD-10-CM | POA: Insufficient documentation

## 2016-08-26 DIAGNOSIS — Z79899 Other long term (current) drug therapy: Secondary | ICD-10-CM | POA: Insufficient documentation

## 2016-08-26 DIAGNOSIS — S0990XA Unspecified injury of head, initial encounter: Secondary | ICD-10-CM | POA: Diagnosis present

## 2016-08-26 DIAGNOSIS — S39012A Strain of muscle, fascia and tendon of lower back, initial encounter: Secondary | ICD-10-CM

## 2016-08-26 DIAGNOSIS — Y92410 Unspecified street and highway as the place of occurrence of the external cause: Secondary | ICD-10-CM | POA: Diagnosis not present

## 2016-08-26 DIAGNOSIS — S060X0A Concussion without loss of consciousness, initial encounter: Secondary | ICD-10-CM

## 2016-08-26 LAB — PREGNANCY, URINE: PREG TEST UR: NEGATIVE

## 2016-08-26 LAB — CBG MONITORING, ED: Glucose-Capillary: 94 mg/dL (ref 65–99)

## 2016-08-26 MED ORDER — ONDANSETRON HCL 4 MG/2ML IJ SOLN
4.0000 mg | Freq: Once | INTRAMUSCULAR | Status: AC
  Administered 2016-08-26: 4 mg via INTRAVENOUS
  Filled 2016-08-26: qty 2

## 2016-08-26 MED ORDER — ONDANSETRON 4 MG PO TBDP: 4.0000 mg | ORAL_TABLET | Freq: Three times a day (TID) | ORAL | 0 refills | Status: DC | PRN

## 2016-08-26 MED ORDER — MORPHINE SULFATE (PF) 4 MG/ML IV SOLN
4.0000 mg | Freq: Once | INTRAVENOUS | Status: AC
  Administered 2016-08-26: 4 mg via INTRAVENOUS
  Filled 2016-08-26: qty 1
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
## 2016-08-28 ENCOUNTER — Telehealth: Payer: Self-pay | Admitting: Internal Medicine
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
## 2016-08-28 NOTE — Telephone Encounter (Signed)
She was given NSAIDS at discharge per the ED note.   Please schedule her next week.  I will have to see her before prescribing anything more. Please make sure she is taking her gabepentin.

## 2016-08-28 NOTE — Telephone Encounter (Signed)
To Dr. Mulberry for further direction 

## 2016-08-31 NOTE — Telephone Encounter (Signed)
To Felicia Ray to notify patient 

## 2016-09-08 NOTE — Telephone Encounter (Signed)
LVM

## 2016-09-10 NOTE — Telephone Encounter (Signed)
Called patient again today to offer appointment at 4 pm today. Unable to leave voicemail

## 2016-11-12 ENCOUNTER — Telehealth: Payer: Self-pay | Admitting: Internal Medicine

## 2016-11-12 NOTE — Telephone Encounter (Signed)
Patient called needs Appt. She fainted two times this week and she feels her arms really heavy

## 2016-11-13 NOTE — Telephone Encounter (Signed)
Please call patient and schedule acute appointment for next week.

## 2016-11-16 ENCOUNTER — Encounter (HOSPITAL_COMMUNITY): Payer: Self-pay | Admitting: Emergency Medicine

## 2016-11-16 ENCOUNTER — Emergency Department (HOSPITAL_COMMUNITY): Payer: Self-pay

## 2016-11-16 ENCOUNTER — Emergency Department (HOSPITAL_COMMUNITY)
Admission: EM | Admit: 2016-11-16 | Discharge: 2016-11-16 | Disposition: A | Discharge status: Home / Self Care | Payer: Self-pay | Attending: Emergency Medicine | Admitting: Emergency Medicine

## 2016-11-16 DIAGNOSIS — R0789 Other chest pain: Secondary | ICD-10-CM | POA: Insufficient documentation

## 2016-11-16 DIAGNOSIS — R079 Chest pain, unspecified: Secondary | ICD-10-CM

## 2016-11-16 DIAGNOSIS — M546 Pain in thoracic spine: Secondary | ICD-10-CM | POA: Insufficient documentation

## 2016-11-16 DIAGNOSIS — G8929 Other chronic pain: Secondary | ICD-10-CM | POA: Insufficient documentation

## 2016-11-16 MED ORDER — KETOROLAC TROMETHAMINE 30 MG/ML IJ SOLN
30.0000 mg | Freq: Once | INTRAMUSCULAR | Status: AC
  Administered 2016-11-16: 30 mg via INTRAMUSCULAR
  Filled 2016-11-16: qty 1

## 2016-11-16 MED ORDER — METHOCARBAMOL 500 MG PO TABS
1000.0000 mg | ORAL_TABLET | Freq: Once | ORAL | Status: AC
  Administered 2016-11-16: 1000 mg via ORAL
  Filled 2016-11-16: qty 2

## 2016-11-16 MED ORDER — CYCLOBENZAPRINE HCL 10 MG PO TABS: 10.0000 mg | ORAL_TABLET | Freq: Two times a day (BID) | ORAL | 0 refills | Status: DC | PRN

## 2016-11-16 MED ORDER — MELOXICAM 15 MG PO TABS: 15.0000 mg | ORAL_TABLET | Freq: Every day | ORAL | 0 refills | Status: DC

## 2016-11-16 NOTE — ED Notes (Signed)
Declined W/C at D/C and was escorted to lobby by RN. 

## 2016-11-16 NOTE — ED Provider Notes (Signed)
[facility] Provider Note   CSN: [id number] Arrival date & time: 11/16/16  1146  By signing my name below, I, Thelma Barge, attest that this documentation has been prepared under the direction and in the presence of Terance Hart, PA-C. Electronically Signed: Thelma Barge, Scribe. 11/16/16. 1:46 PM.  History   Chief Complaint Chief Complaint  Patient presents with  . Back Pain   The history is provided by the patient. A language interpreter was used.    HPI Comments: Felicia Ray is a 51 y.o. female with a PMHx of back injury and chronic pain who presents to the Emergency Department complaining of waxing/waning, sharp pain in the center of her back that began on May 27. She states the pain radiates to the left side of her shoulder and chest and moving her arms make it worse. She has associated SOB, cough, nausea, difficulty walking, and LOC due to the pain. She states her symptoms improved over the next few days, but on May 30, her symptoms began again. She denies any mechanism of injury or trauma to the area. She has tried naproxen and cold compresses with no relief. She denies fever, abdominal pain, and vomiting. She has not had this particular pain before, but notes she had an accident a while ago where she injured her lower back after falling, but these symptoms are currently not present. Pt has no pertinent medical problems and denies smoking.  Past Medical History:  Diagnosis Date  . Chronic pain syndrome 01/23/2014   Fall by slipping on water at work.  Worker's Comp case.  Followed by Dr. Corine Shelter,  and [facility].  Waiting for second opinion.  Not clear why left low back and knee are not improving.  Called neuropathic pain by Dr. Ethelene Hal.  . Hypercholesteremia     Patient Active Problem List   Diagnosis Date Noted  . Long term use of drug 06/12/2016  . Onychomycosis of toenail 05/01/2016  . Chronic pain 06/26/2015  . Decreased visual acuity 06/26/2015  .  Hyperlipidemia 06/26/2015  . Depression 06/26/2015    Past Surgical History:  Procedure Laterality Date  . TUBAL LIGATION  07/20/2005    OB History    No data available       Home Medications    Prior to Admission medications   Medication Sig Start Date End Date Taking? Authorizing Provider  atorvastatin (LIPITOR) 20 MG tablet Take 1 tablet (20 mg total) by mouth daily. 04/09/16   Julieanne Manson, MD  gabapentin (NEURONTIN) 100 MG capsule 2 caps by mouth in morning and bedtime 02/20/16   Julieanne Manson, MD  niacin 500 MG tablet Take 500 mg by mouth 2 (two) times daily.    [provider]  Omega 3 1200 MG CAPS Take 1 capsule by mouth 2 (two) times daily.    [provider]  ondansetron (ZOFRAN ODT) 4 MG disintegrating tablet Take 1 tablet (4 mg total) by mouth every 8 (eight) hours as needed for nausea or vomiting. 08/26/16   Pricilla Loveless, MD    Family History Family History  Problem Relation Age of Onset  . Hypertension Mother   . Asthma Mother   . Hyperlipidemia Mother   . Diabetes Sister   . Hypertension Sister   . Hyperlipidemia Sister   . Hypertension Brother     Social History Social History  Substance Use Topics  . Smoking status: Never Smoker  . Smokeless tobacco: Never Used  . Alcohol use No  Allergies   Aspirin and Dipyrone   Review of Systems Review of Systems  Constitutional: Negative for fever.  Respiratory: Positive for cough and shortness of breath.   Gastrointestinal: Positive for nausea. Negative for abdominal pain and vomiting.  Musculoskeletal: Positive for arthralgias, back pain, gait problem and myalgias.  Neurological: Positive for syncope.  All other systems reviewed and are negative.    Physical Exam Updated Vital Signs BP 101/77 (BP Location: Left Arm)   Pulse 80   Temp 97.8 F (36.6 C) (Oral)   Resp 20   Ht 5\' 1"  (1.549 m)   Wt 175 lb (79.4 kg)   LMP 08/26/2016 (Exact Date)   SpO2 100%   BMI  33.07 kg/m   Physical Exam  Constitutional: She is oriented to person, place, and time. She appears well-developed and well-nourished. No distress.  HENT:  Head: Normocephalic and atraumatic.  Eyes: Conjunctivae are normal. Pupils are equal, round, and reactive to light. Right eye exhibits no discharge. Left eye exhibits no discharge. No scleral icterus.  Neck: Normal range of motion.  Cardiovascular: Normal rate and regular rhythm.  Exam reveals no gallop and no friction rub.   No murmur heard. Pulmonary/Chest: Effort normal and breath sounds normal. No respiratory distress. She has no wheezes. She has no rales. She exhibits no tenderness.  Abdominal: She exhibits no distension.  Musculoskeletal: She exhibits tenderness.  Diffuse left upper chest tenderness, left shoulder tenderness, left thoracic tenderness. 5/5 strength in upper extremities. Ambulatory without difficulty    Neurological: She is alert and oriented to person, place, and time.  Skin: Skin is warm and dry.  Psychiatric: She has a normal mood and affect. Her behavior is normal.  Nursing note and vitals reviewed.    ED Treatments / Results  DIAGNOSTIC STUDIES: Oxygen Saturation is 100% on RA, normal by my interpretation.    COORDINATION OF CARE: 1:42 PM Discussed treatment plan with pt at bedside and pt agreed to plan.  Labs (all labs ordered are listed, but only abnormal results are displayed) Labs Reviewed - No data to display  EKG  EKG Interpretation  Date/Time:  Monday November 16 2016 14:03:33 EDT Ventricular Rate:  63 PR Interval:  140 QRS Duration: 76 QT Interval:  418 QTC Calculation: 427 R Axis:   66 Text Interpretation:  Normal sinus rhythm Low voltage QRS Borderline ECG No previous ECGs available Confirmed by Alvira MondaySchlossman, Erin ([id number]) on 11/18/2016 1:32:30 AM       Radiology No results found.  Procedures Procedures (including critical care time)  Medications Ordered in ED Medications    methocarbamol (ROBAXIN) tablet 1,000 mg (1,000 mg Oral Given 11/16/16 1431)  ketorolac (TORADOL) 30 MG/ML injection 30 mg (30 mg Intramuscular Given 11/16/16 1432)     Initial Impression / Assessment and Plan / ED Course  I have reviewed the triage vital signs and the nursing notes.  Pertinent labs & imaging results that were available during my care of the patient were reviewed by me and considered in my medical decision making (see chart for details).  51 year old with hx of chronic pain presents with atypical chest pain and mid back pain. Vitals are normal. EKG is NSR. CXR is negative. Pain is reproducible with palpation. Will treat with NSAIDs and muscle relaxers and PCP follow up. Return precautions given.  Final Clinical Impressions(s) / ED Diagnoses   Final diagnoses:  Acute left-sided thoracic back pain  Left sided chest pain    New Prescriptions Discharge Medication  List as of 11/16/2016  2:32 PM    START taking these medications   Details  cyclobenzaprine (FLEXERIL) 10 MG tablet Take 1 tablet (10 mg total) by mouth 2 (two) times daily as needed for muscle spasms., Starting Mon 11/16/2016, Print    meloxicam (MOBIC) 15 MG tablet Take 1 tablet (15 mg total) by mouth daily., Starting Mon 11/16/2016, Print       .    Bethel Born, PA-C 11/19/16 1540    Raeford Razor, MD 11/23/16 [phone]

## 2016-11-16 NOTE — Discharge Instructions (Signed)
Take Meloxicam once a day Take muscle relaxer as needed Follow up with your doctor

## 2016-11-16 NOTE — ED Triage Notes (Signed)
Pt from home with c/o left upper/mid-thoracic back pain starting last Sunday.  Pt denies injury, ambulatory, NAD, A&O.

## 2016-11-19 ENCOUNTER — Encounter: Payer: Self-pay | Admitting: Internal Medicine

## 2016-11-19 ENCOUNTER — Ambulatory Visit (INDEPENDENT_AMBULATORY_CARE_PROVIDER_SITE_OTHER): Payer: Self-pay | Admitting: Internal Medicine

## 2016-11-19 VITALS — BP 98/60 | HR 88 | Temp 98.2°F | Resp 12 | Ht 59.25 in | Wt 175.0 lb

## 2016-11-19 DIAGNOSIS — M546 Pain in thoracic spine: Secondary | ICD-10-CM

## 2016-11-19 DIAGNOSIS — R55 Syncope and collapse: Secondary | ICD-10-CM

## 2016-11-19 DIAGNOSIS — J069 Acute upper respiratory infection, unspecified: Secondary | ICD-10-CM

## 2016-11-19 MED ORDER — PREDNISONE 10 MG PO TABS: ORAL_TABLET | ORAL | 0 refills | Status: DC

## 2016-11-19 NOTE — Progress Notes (Signed)
   Subjective:    Patient ID: Felicia Ray, female    DOB: 08/13/1965, 51 y.o.   MRN: [id number]  HPI   Two episodes of fainting in past week.  Each time she has passed out, has been associated with pain in left posterior thorax.  States this is a very sharp, stabbing pain.   States when occurs, lasts up to 2 days. First episode was May 27th. Has had 6-7 episodes. Was seen in ED for this on 11/16/2016--this is when last episode started. Chest Xray and EKG were normal.   Was given Meloxicam 15 mg daily and Cyclobenzaprine 10 mg twice daily for muscle pain Gets dizzy with the pain and faints.  Has had this happen 3 times.  2 witnessed. Thinks she is out for about 1 minute. Last episode was yesterday about 1 pm. States she was ironing yesterday, had a lot of pain and started falling back.  Someone caught her and lowered her to floor.   No associated nausea. Feels dyspnea as hurts to breath. No associated palptitations States pain is so intense she has to bend forward. No skin rash in area.  No burning or itching in area.    5 days ago, developed cough, nonproductive.  Has hoarseness as well. Cough is making chest pain worse.     Review of Systems     Objective:   Physical Exam  NAD HEENT:  PERRL, EOMI, TMs pearly gray, dry cough Cobbling of posterior pharyngeal wall, no exudates, Nasal mucosa boggy/swollen with clear discharge. Neck:  Supple, no adenopathy Chest:  CTA;  Tender with compression of thorax, both from AP and side to side in area of left posterior chest.  No rash or erythema of skin in same area CV:  RRR with normal S1 and S2, No S3, S4, or murmur, radial and DP pulses normal and equal. Abd:  S, NT, No HSM or mass, + BS LE: No edem        Assessment & Plan:  1.  Short lived syncopal episodes always associated with pain.    2.  Chest wall pain:  Prednisone burst and taper as muscle relaxants and NSAIDS have not improved the pain.  CXR and EKG previously  normal.  3.  URI:  Symptomatic treatment.  To splint chest wall with pillow when coughing.

## 2016-11-19 NOTE — Patient Instructions (Addendum)
Prednisone burst and taper:  Day 1:  4 tabs Day 2:  4 tabs Day 3:  4 tabs Day 4:  3.5 tabs Day 5:  3 tabs Day 6:  2.5 tabs Day 7:  2 tabs Day 8:  1.5 tabs Day 9:  1 tab Day 10:  0.5 tabs Day 11:  Done  Pick up Delsym and take as needed for cough

## 2016-11-20 LAB — COMPREHENSIVE METABOLIC PANEL
ALK PHOS: 69 IU/L (ref 39–117)
ALT: 17 IU/L (ref 0–32)
AST: 17 IU/L (ref 0–40)
Albumin/Globulin Ratio: 1.5 (ref 1.2–2.2)
Albumin: 4.1 g/dL (ref 3.5–5.5)
BILIRUBIN TOTAL: 0.4 mg/dL (ref 0.0–1.2)
BUN / CREAT RATIO: 14 (ref 9–23)
BUN: 10 mg/dL (ref 6–24)
CHLORIDE: 101 mmol/L (ref 96–106)
CO2: 25 mmol/L (ref 18–29)
CREATININE: 0.73 mg/dL (ref 0.57–1.00)
Calcium: 9.2 mg/dL (ref 8.7–10.2)
GFR calc Af Amer: 112 mL/min/{1.73_m2} (ref >59)
GFR calc non Af Amer: 97 mL/min/{1.73_m2} (ref >59)
Globulin, Total: 2.8 g/dL (ref 1.5–4.5)
Glucose: 73 mg/dL (ref 65–99)
Potassium: 4.8 mmol/L (ref 3.5–5.2)
Sodium: 141 mmol/L (ref 134–144)
Total Protein: 6.9 g/dL (ref 6.0–8.5)

## 2016-11-20 LAB — CBC WITH DIFFERENTIAL/PLATELET
BASOS ABS: 0 10*3/uL (ref 0.0–0.2)
Basos: 0 %
EOS (ABSOLUTE): 0.2 10*3/uL (ref 0.0–0.4)
Eos: 3 %
HEMOGLOBIN: 14.4 g/dL (ref 11.1–15.9)
Hematocrit: 42.9 % (ref 34.0–46.6)
IMMATURE GRANS (ABS): 0 10*3/uL (ref 0.0–0.1)
Immature Granulocytes: 0 %
LYMPHS: 32 %
Lymphocytes Absolute: 1.8 10*3/uL (ref 0.7–3.1)
MCH: 30.3 pg (ref 26.6–33.0)
MCHC: 33.6 g/dL (ref 31.5–35.7)
MCV: 90 fL (ref 79–97)
MONOCYTES: 7 %
Monocytes Absolute: 0.4 10*3/uL (ref 0.1–0.9)
Neutrophils Absolute: 3.4 10*3/uL (ref 1.4–7.0)
Neutrophils: 58 %
PLATELETS: 235 10*3/uL (ref 150–379)
RBC: 4.75 x10E6/uL (ref 3.77–5.28)
RDW: 13.9 % (ref 12.3–15.4)
WBC: 5.8 10*3/uL (ref 3.4–10.8)

## 2017-02-18 ENCOUNTER — Ambulatory Visit (INDEPENDENT_AMBULATORY_CARE_PROVIDER_SITE_OTHER): Payer: Self-pay | Admitting: Internal Medicine

## 2017-02-18 ENCOUNTER — Encounter: Payer: Self-pay | Admitting: Internal Medicine

## 2017-02-18 VITALS — BP 102/60 | HR 88 | Resp 12 | Ht 59.25 in | Wt 180.0 lb

## 2017-02-18 DIAGNOSIS — Z1231 Encounter for screening mammogram for malignant neoplasm of breast: Secondary | ICD-10-CM

## 2017-02-18 DIAGNOSIS — M546 Pain in thoracic spine: Secondary | ICD-10-CM

## 2017-02-18 DIAGNOSIS — Z1239 Encounter for other screening for malignant neoplasm of breast: Secondary | ICD-10-CM

## 2017-02-18 NOTE — Progress Notes (Signed)
   Subjective:    Patient ID: Felicia Ray, female    DOB: 1965-08-06, 51 y.o.   MRN: [id number]  HPI   Left sided thoracic back pain:  Has one more visit for PT, but states her pain is resolved and very happy with her care.  Also with pain in right foot and calf that was treated and has resolved as well.  Current Meds  Medication Sig  . atorvastatin (LIPITOR) 20 MG tablet Take 1 tablet (20 mg total) by mouth daily.  Marland Kitchen. gabapentin (NEURONTIN) 100 MG capsule 2 caps by mouth in morning and bedtime  . niacin 500 MG tablet Take 500 mg by mouth 2 (two) times daily.  . Omega 3 1200 MG CAPS Take 1 capsule by mouth 2 (two) times daily.   Allergies  Allergen Reactions  . Aspirin Other (See Comments)    Cant breath  . Dipyrone Hives    Review of Systems     Objective:   Physical Exam  Full ROM of back.  NT Moves smoothly and without any pain.  Her usual limp is resolved      Assessment & Plan:  1.  Left thoracic back pain and right foot pain:  Looks great with smooth movement  2.  HM:  Needs CPE.  Schedule mammogram

## 2017-02-22 ENCOUNTER — Other Ambulatory Visit: Payer: Self-pay

## 2017-03-24 ENCOUNTER — Ambulatory Visit
Admission: RE | Admit: 2017-03-24 | Discharge: 2017-03-24 | Disposition: A | Discharge status: Home / Self Care | Payer: No Typology Code available for payment source | Source: Ambulatory Visit | Attending: Internal Medicine | Admitting: Internal Medicine

## 2017-03-24 DIAGNOSIS — Z1239 Encounter for other screening for malignant neoplasm of breast: Secondary | ICD-10-CM

## 2017-04-14 ENCOUNTER — Telehealth: Payer: Self-pay | Admitting: Internal Medicine

## 2017-04-14 DIAGNOSIS — G894 Chronic pain syndrome: Secondary | ICD-10-CM

## 2017-04-14 MED ORDER — ATORVASTATIN CALCIUM 20 MG PO TABS: 20.0000 mg | ORAL_TABLET | Freq: Every day | ORAL | 11 refills | Status: DC

## 2017-04-14 MED ORDER — GABAPENTIN 100 MG PO CAPS: ORAL_CAPSULE | ORAL | 11 refills | Status: DC

## 2017-04-14 NOTE — Telephone Encounter (Signed)
Rx's sent to health department. Please notify patient

## 2017-04-14 NOTE — Telephone Encounter (Signed)
Patient stopped at the clinic and stated needs a Rx on Gabapentin 100 MG and Atorvastatin 20 MG. Please advise.

## 2017-04-16 NOTE — Telephone Encounter (Signed)
Patient notified on 04/16/17.

## 2017-05-20 ENCOUNTER — Ambulatory Visit: Payer: Self-pay | Admitting: Internal Medicine

## 2017-05-24 ENCOUNTER — Other Ambulatory Visit: Payer: Self-pay

## 2017-05-27 ENCOUNTER — Ambulatory Visit: Payer: Self-pay | Admitting: Internal Medicine

## 2017-07-02 ENCOUNTER — Other Ambulatory Visit: Payer: Self-pay

## 2017-07-02 DIAGNOSIS — Z13220 Encounter for screening for lipoid disorders: Secondary | ICD-10-CM

## 2017-07-02 DIAGNOSIS — Z79899 Other long term (current) drug therapy: Secondary | ICD-10-CM

## 2017-07-03 LAB — CBC WITH DIFFERENTIAL/PLATELET
BASOS ABS: 0 10*3/uL (ref 0.0–0.2)
Basos: 0 %
EOS (ABSOLUTE): 0.1 10*3/uL (ref 0.0–0.4)
Eos: 2 %
Hematocrit: 42.5 % (ref 34.0–46.6)
Hemoglobin: 14.2 g/dL (ref 11.1–15.9)
Immature Grans (Abs): 0 10*3/uL (ref 0.0–0.1)
Immature Granulocytes: 0 %
LYMPHS ABS: 2 10*3/uL (ref 0.7–3.1)
Lymphs: 33 %
MCH: 30.5 pg (ref 26.6–33.0)
MCHC: 33.4 g/dL (ref 31.5–35.7)
MCV: 91 fL (ref 79–97)
MONOS ABS: 0.4 10*3/uL (ref 0.1–0.9)
Monocytes: 6 %
Neutrophils Absolute: 3.4 10*3/uL (ref 1.4–7.0)
Neutrophils: 59 %
PLATELETS: 250 10*3/uL (ref 150–379)
RBC: 4.66 x10E6/uL (ref 3.77–5.28)
RDW: 13.9 % (ref 12.3–15.4)
WBC: 6 10*3/uL (ref 3.4–10.8)

## 2017-07-03 LAB — LIPID PANEL W/O CHOL/HDL RATIO
CHOLESTEROL TOTAL: 188 mg/dL (ref 100–199)
HDL: 35 mg/dL — ABNORMAL LOW (ref >39)
LDL Calculated: 107 mg/dL — ABNORMAL HIGH (ref 0–99)
Triglycerides: 231 mg/dL — ABNORMAL HIGH (ref 0–149)
VLDL Cholesterol Cal: 46 mg/dL — ABNORMAL HIGH (ref 5–40)

## 2017-07-06 ENCOUNTER — Encounter: Payer: Self-pay | Admitting: Internal Medicine

## 2017-07-06 ENCOUNTER — Ambulatory Visit: Payer: Self-pay | Admitting: Internal Medicine

## 2017-07-06 VITALS — BP 112/70 | HR 78 | Resp 12 | Ht 59.25 in | Wt 175.0 lb

## 2017-07-06 DIAGNOSIS — H538 Other visual disturbances: Secondary | ICD-10-CM

## 2017-07-06 DIAGNOSIS — E7800 Pure hypercholesterolemia, unspecified: Secondary | ICD-10-CM

## 2017-07-06 DIAGNOSIS — Z124 Encounter for screening for malignant neoplasm of cervix: Secondary | ICD-10-CM

## 2017-07-06 DIAGNOSIS — Z9189 Other specified personal risk factors, not elsewhere classified: Secondary | ICD-10-CM

## 2017-07-06 DIAGNOSIS — Z0000 Encounter for general adult medical examination without abnormal findings: Secondary | ICD-10-CM

## 2017-07-06 LAB — POCT WET PREP WITH KOH
Clue Cells Wet Prep HPF POC: NEGATIVE
KOH PREP POC: NEGATIVE
RBC Wet Prep HPF POC: NEGATIVE
Trichomonas, UA: NEGATIVE
YEAST WET PREP PER HPF POC: NEGATIVE

## 2017-07-06 NOTE — Patient Instructions (Signed)
Return stool cards in 2 weeks. 

## 2017-07-06 NOTE — Progress Notes (Signed)
Subjective:    Patient ID: Felicia Ray, female    DOB: [birth year], 52 y.o.   MRN: [id number]  HPI   CPE with pap  1.  Pap:  Cannot recall when had last pap.  Thought it was done here last year, but no documentation of a pap.  Not found in previous EMR notes/[facility].  [facility] Never abnormal.  No family history of cervical cancer. Maternal grandmother had uterine cancer.  2.  Mammogram:  Had normal mammogram on 03/24/2017.  No family history of breast cancer.  3.  Osteoprevention:  Not much in way of dairy daily.  Still having periods irregularly.  Works at a [employer].  Very physically active in job. No family history of osteoporosis.    4.  Guaiac Cards:  Never.  No family of colon cancer.  5.  Colonoscopy:  Never.  As above.    6.  Immunizations: Immunization History  Administered Date(s) Administered  . Influenza,inj,Quad PF,6+ Mos 03/26/2016  . Influenza-Unspecified 04/14/2016  . Tdap 02/20/2016    7.  Glucose/Cholesterol Recent glucose fine fasting at 73.  Lipid Panel     Component Value Date/Time   CHOL 188 07/02/2017 1015   TRIG 231 (H) 07/02/2017 1015   HDL 35 (L) 07/02/2017 1015   LDLCALC 107 (H) 07/02/2017 1015      Review of Systems  Constitutional: Positive for fatigue. Negative for appetite change and fever.  HENT: Negative for dental problem, ear pain, hearing loss, rhinorrhea and sneezing.   Eyes:       Sometimes vision is blurry and watery  Respiratory: Negative for cough and shortness of breath.   Cardiovascular: Negative for chest pain, palpitations and leg swelling.  Gastrointestinal: Positive for abdominal pain, constipation and diarrhea.  Genitourinary: Positive for dysuria and vaginal discharge. Negative for frequency.  Musculoskeletal:       History of chronic hip/thigh pain.  She is pain free currently  Skin: Positive for rash.  Neurological: Negative for dizziness, weakness and numbness.    Psychiatric/Behavioral: Negative for dysphoric mood and suicidal ideas. The patient is not nervous/anxious.        Finished counseling with Lesia SagoPura Lopez.  Was told she was find.       Objective:   Physical Exam  Constitutional: She is oriented to person, place, and time. She appears well-developed and well-nourished.  HENT:  Head: Normocephalic and atraumatic.  Right Ear: Hearing, tympanic membrane, external ear and ear canal normal.  Left Ear: Hearing, tympanic membrane, external ear and ear canal normal.  Nose: Nose normal.  Mouth/Throat: Uvula is midline, oropharynx is clear and moist and mucous membranes are normal.  Eyes: Conjunctivae and EOM are normal. Pupils are equal, round, and reactive to light.  Discs sharp bilaterally  Neck: Normal range of motion. Neck supple. No thyromegaly present.  Cardiovascular: Normal rate, regular rhythm, S1 normal and S2 normal. Exam reveals no S3, no S4 and no friction rub.  No murmur heard. No carotid bruits.  Carotid, radial, femoral, DP and PT pulses normal and equal.   Pulmonary/Chest: Effort normal and breath sounds normal. Right breast exhibits no inverted nipple, no mass, no nipple discharge, no skin change and no tenderness. Left breast exhibits no inverted nipple, no mass, no nipple discharge, no skin change and no tenderness.  Abdominal: Soft. Bowel sounds are normal. She exhibits no mass. There is no hepatosplenomegaly. No hernia.  Genitourinary: Vagina normal. Rectal exam shows guaiac negative stool.  Genitourinary Comments: Normal external genitalia.  Cervix without lesion.  No uterine or adnexal mass or tenderness.  Pap taken.  Musculoskeletal: Normal range of motion.  Lymphadenopathy:       Head (right side): No submental and no submandibular adenopathy present.       Head (left side): No submental and no submandibular adenopathy present.    She has no cervical adenopathy.    She has no axillary adenopathy.       Right: No inguinal  and no supraclavicular adenopathy present.       Left: No inguinal and no supraclavicular adenopathy present.  Neurological: She is alert and oriented to person, place, and time. She has normal strength and normal reflexes. No cranial nerve deficit or sensory deficit. Coordination and gait normal.  Skin: Skin is warm and dry. No rash noted.  Psychiatric: She has a normal mood and affect. Her speech is normal and behavior is normal. Judgment and thought content normal. Cognition and memory are normal.    Wet prep normal.      Assessment & Plan:  1.  CPE with pap Mammogram normal in October Encouraged influenza vaccin in Sept/Oct as we are out now. Hemoccult cards x 3, to return in 2 weeks. Encouraged increasing intake of calcium and Vitamin D rich foods.  2.  Blurry vision:  Optometry referral.  3.  Dental Care:  Dental referral.  4.  Hyperlipidemia:  To work on diet and exercise more.  Discussed at length.

## 2017-07-07 LAB — CYTOLOGY - PAP

## 2017-08-04 ENCOUNTER — Encounter: Payer: Self-pay | Admitting: Internal Medicine

## 2017-08-04 ENCOUNTER — Ambulatory Visit: Payer: Self-pay | Admitting: Internal Medicine

## 2017-08-04 VITALS — BP 102/72 | HR 66 | Resp 12 | Ht 59.25 in | Wt 178.0 lb

## 2017-08-04 DIAGNOSIS — M546 Pain in thoracic spine: Secondary | ICD-10-CM

## 2017-08-04 MED ORDER — CYCLOBENZAPRINE HCL 10 MG PO TABS: 10.0000 mg | ORAL_TABLET | Freq: Two times a day (BID) | ORAL | 0 refills | Status: DC | PRN

## 2017-08-04 MED ORDER — HYDROCODONE-ACETAMINOPHEN 5-325 MG PO TABS: 1.0000 | ORAL_TABLET | Freq: Four times a day (QID) | ORAL | 0 refills | Status: DC | PRN

## 2017-08-04 NOTE — Progress Notes (Signed)
   Subjective:    Patient ID: Felicia Ray, female    DOB: Jul 23, 1965, 52 y.o.   MRN: [id number]  HPI   Interpreted with Felicia Ray  Right upper thoracic back pain for 1 week.  Started in right trap area.  Now radiating down her right arm.  Does not recall injury   She was pulling down an ironing machine with an arm she pulls down repeatedly with her right arm.  She was using this more with work before the pain started, but just woke up one day with the pain.   She does state, the machine arm was not working properly and she was using extra pressure to get it down.  States she is having numbness and tingling in all fingers--but the only time this happens is when she lies on her right side.  She has not taken anything for the pain.  Not even her Meloxicam.  Has had nausea in past 2 days--seems to come on with increased pain.  Current Meds  Medication Sig  . atorvastatin (LIPITOR) 20 MG tablet Take 1 tablet (20 mg total) by mouth daily.  Marland Kitchen. gabapentin (NEURONTIN) 100 MG capsule 2 caps by mouth in morning and bedtime  . niacin 500 MG tablet Take 500 mg by mouth 2 (two) times daily.  . Omega 3 1200 MG CAPS Take 1 capsule by mouth 2 (two) times daily.    Allergies  Allergen Reactions  . Aspirin Other (See Comments)    Cant breath  . Dipyrone Hives      Review of Systems     Objective:   Physical Exam Mild distress Right upper thoracic back and right shoulder/arm:  Full ROM of shoulder, though lots of pain with full flexion and abduction.   Tender over right trap and down thoracic back medially to scapula. Tender over AC/CC and pectoralis muscles as well.   Normal sensation to light touch of all fingers. DTRs 2+/4, Motor 5/5  Abd:  S, NT + BS        Assessment & Plan:  1.  Right upper thoracic, shoulder pain.  Appears to be muscular in origin from overuse. Cyclobenzaprine 5-10 mg every 8 hours as needed for pain Hydrocodone/Tylenol for severe pain every 6  hours. Rest. No work until next Monday, Feb 25th

## 2017-09-23 ENCOUNTER — Ambulatory Visit (INDEPENDENT_AMBULATORY_CARE_PROVIDER_SITE_OTHER): Payer: Self-pay | Admitting: Internal Medicine

## 2017-09-23 ENCOUNTER — Encounter: Payer: Self-pay | Admitting: Internal Medicine

## 2017-09-23 VITALS — BP 112/72 | HR 78 | Resp 12 | Ht 59.25 in | Wt 176.0 lb

## 2017-09-23 DIAGNOSIS — E7800 Pure hypercholesterolemia, unspecified: Secondary | ICD-10-CM

## 2017-09-23 DIAGNOSIS — M5442 Lumbago with sciatica, left side: Secondary | ICD-10-CM

## 2017-09-23 DIAGNOSIS — G894 Chronic pain syndrome: Secondary | ICD-10-CM

## 2017-09-23 NOTE — Progress Notes (Signed)
   Subjective:    Patient ID: Felicia Ray, female    DOB: [birth year], 52 y.o.   MRN: [id number]  HPI   1.  Had left low back pain yesterday with burning down her leg.  Took her muscle relaxant after called for appt and pain resolved.   Just taking taking her gabapentin 200 mg at bedtime.  Not taking morning dose. She does always have mild discomfort and willing to increase her dose.   2. Hyperlipidemia:  States she has made changes with her diet since CPE in January when her cholesterol panel was not good.  She is trying to be more physically active.  Current Meds  Medication Sig  . atorvastatin (LIPITOR) 20 MG tablet Take 1 tablet (20 mg total) by mouth daily.  . cyclobenzaprine (FLEXERIL) 10 MG tablet Take 1 tablet (10 mg total) by mouth 2 (two) times daily as needed for muscle spasms.  Marland Kitchen. gabapentin (NEURONTIN) 100 MG capsule 2 caps by mouth in morning and bedtime  . niacin 500 MG tablet Take 500 mg by mouth 2 (two) times daily.  . Omega 3 1200 MG CAPS Take 1 capsule by mouth 2 (two) times daily.   Allergies  Allergen Reactions  . Aspirin Other (See Comments)    Cant breath  . Dipyrone Hives    Review of Systems     Objective:   Physical Exam Lungs:  CTA CV:  RRR without murmur or rub. Radial pulses normal and equal MS:  NT over L/S spinous processes.  Mildly tender over left paraspinous musculature L/S back.  Negative straight leg raise.   Neuro:  LE:  Motor 5/5, DTRs 2+/4.  Gait normal       Assessment & Plan:  1.  Low left back pain, acute and chronic pain:  Resolving.  Encouraged her to take muscle relaxant right away in the future.  Continue exercises given by PT in past To start her morning dose of Gabapentin 1 cap in the morning and maintain her 2 capsule dose in the evening.  In 3 days, increase to 2 caps twice daily and maintain that dose.   Return in 2 months to see how she is doing with pain.  2  Hyperlipidemia:  Not at goal in January.  To return in 2  months for fasting FLP/CMP and will see me 2 days later.  States she has made significant changes.

## 2017-11-18 ENCOUNTER — Other Ambulatory Visit: Payer: Self-pay

## 2017-11-18 DIAGNOSIS — Z79899 Other long term (current) drug therapy: Secondary | ICD-10-CM

## 2017-11-18 DIAGNOSIS — E7800 Pure hypercholesterolemia, unspecified: Secondary | ICD-10-CM

## 2017-11-19 ENCOUNTER — Other Ambulatory Visit: Payer: Self-pay

## 2017-11-19 LAB — COMPREHENSIVE METABOLIC PANEL
A/G RATIO: 1.6 (ref 1.2–2.2)
ALT: 20 IU/L (ref 0–32)
AST: 16 IU/L (ref 0–40)
Albumin: 4.3 g/dL (ref 3.5–5.5)
Alkaline Phosphatase: 85 IU/L (ref 39–117)
BUN/Creatinine Ratio: 16 (ref 9–23)
BUN: 12 mg/dL (ref 6–24)
Bilirubin Total: 0.4 mg/dL (ref 0.0–1.2)
CALCIUM: 9.5 mg/dL (ref 8.7–10.2)
CO2: 25 mmol/L (ref 20–29)
Chloride: 101 mmol/L (ref 96–106)
Creatinine, Ser: 0.74 mg/dL (ref 0.57–1.00)
GFR, EST AFRICAN AMERICAN: 109 mL/min/{1.73_m2} (ref >59)
GFR, EST NON AFRICAN AMERICAN: 95 mL/min/{1.73_m2} (ref >59)
GLOBULIN, TOTAL: 2.7 g/dL (ref 1.5–4.5)
Glucose: 92 mg/dL (ref 65–99)
POTASSIUM: 4.8 mmol/L (ref 3.5–5.2)
SODIUM: 139 mmol/L (ref 134–144)
TOTAL PROTEIN: 7 g/dL (ref 6.0–8.5)

## 2017-11-19 LAB — LIPID PANEL W/O CHOL/HDL RATIO
CHOLESTEROL TOTAL: 142 mg/dL (ref 100–199)
HDL: 33 mg/dL — ABNORMAL LOW (ref >39)
LDL Calculated: 64 mg/dL (ref 0–99)
TRIGLYCERIDES: 226 mg/dL — AB (ref 0–149)
VLDL CHOLESTEROL CAL: 45 mg/dL — AB (ref 5–40)

## 2017-11-25 ENCOUNTER — Encounter: Payer: Self-pay | Admitting: Internal Medicine

## 2017-11-25 ENCOUNTER — Ambulatory Visit: Payer: Self-pay | Admitting: Internal Medicine

## 2017-11-25 VITALS — BP 102/68 | HR 62 | Resp 12 | Ht 59.25 in | Wt 178.0 lb

## 2017-11-25 DIAGNOSIS — E782 Mixed hyperlipidemia: Secondary | ICD-10-CM

## 2017-11-25 MED ORDER — NIACIN 500 MG PO TABS: ORAL_TABLET | ORAL | Status: DC

## 2017-11-25 NOTE — Progress Notes (Signed)
   Subjective:    Patient ID: Magdalene PatriciaMaria Ferra-Zoma, female    DOB: 1966-03-28, 52 y.o.   MRN: [id number]  HPI   Mixed Hyperlipidemia:  LDL and total cholesterol are much improved with lifestyle changes, though her HDL and triglycerides remain outside of goal. She is walking more, sleeping better, trying to eat better. Her back and joints are not so bad that she cannot be more active.  She is not certain she can get into a pool or on a bike. Discussed looking into Aquatic Center.  She would be willing to gradually increase her Niacin to 1000 mg twice daily.   Current Meds  Medication Sig  . atorvastatin (LIPITOR) 20 MG tablet Take 1 tablet (20 mg total) by mouth daily.  Marland Kitchen. gabapentin (NEURONTIN) 100 MG capsule 2 caps by mouth in morning and bedtime  . niacin 500 MG tablet Take 500 mg by mouth 2 (two) times daily.  . Omega 3 1200 MG CAPS Take 1 capsule by mouth 2 (two) times daily.   Allergies  Allergen Reactions  . Aspirin Other (See Comments)    Cant breath  . Dipyrone Hives      Review of Systems     Objective:   Physical Exam NAD Moves easily today Lungs:  CTA CV:  RRR without murmur or rub.  Radial pulses normal and equal       Assessment & Plan:  1.  Mixed hyperlipidemia:  Continues on Niacin 500 mg twice daily, Fish oil 1200 mg 3 times daily and Atorvastatin 20 mg daily. Titrate up on Niacin to 1000 mg in the morning and 500 mg in afternoon for 2 weeks, then 1000 mg twice daily. To consider looking into aquatic center for laps in shallow side of pool or find a stationary bike at garage sale. Repeat FLP in 4 months with hepatic profile and follow up with me.

## 2018-01-10 ENCOUNTER — Emergency Department (HOSPITAL_COMMUNITY): Payer: Self-pay

## 2018-01-10 ENCOUNTER — Telehealth: Payer: Self-pay

## 2018-01-10 ENCOUNTER — Emergency Department (HOSPITAL_COMMUNITY)
Admission: EM | Admit: 2018-01-10 | Discharge: 2018-01-10 | Disposition: A | Discharge status: Home / Self Care | Payer: Self-pay | Attending: Emergency Medicine | Admitting: Emergency Medicine

## 2018-01-10 DIAGNOSIS — R42 Dizziness and giddiness: Secondary | ICD-10-CM | POA: Insufficient documentation

## 2018-01-10 DIAGNOSIS — R0789 Other chest pain: Secondary | ICD-10-CM | POA: Insufficient documentation

## 2018-01-10 DIAGNOSIS — Z79899 Other long term (current) drug therapy: Secondary | ICD-10-CM | POA: Insufficient documentation

## 2018-01-10 DIAGNOSIS — R0602 Shortness of breath: Secondary | ICD-10-CM | POA: Insufficient documentation

## 2018-01-10 LAB — CBC
HCT: 44.5 % (ref 36.0–46.0)
HEMOGLOBIN: 15.6 g/dL — AB (ref 12.0–15.0)
MCH: 30.4 pg (ref 26.0–34.0)
MCHC: 35.1 g/dL (ref 30.0–36.0)
MCV: 86.7 fL (ref 78.0–100.0)
Platelets: 284 10*3/uL (ref 150–400)
RBC: 5.13 MIL/uL — ABNORMAL HIGH (ref 3.87–5.11)
RDW: 13.4 % (ref 11.5–15.5)
WBC: 7.1 10*3/uL (ref 4.0–10.5)

## 2018-01-10 LAB — BASIC METABOLIC PANEL
ANION GAP: 11 (ref 5–15)
BUN: 18 mg/dL (ref 6–20)
CHLORIDE: 104 mmol/L (ref 98–111)
CO2: 23 mmol/L (ref 22–32)
Calcium: 9.8 mg/dL (ref 8.9–10.3)
Creatinine, Ser: 0.84 mg/dL (ref 0.44–1.00)
GFR calc Af Amer: >60 mL/min (ref >60)
GFR calc non Af Amer: >60 mL/min (ref >60)
GLUCOSE: 89 mg/dL (ref 70–99)
Potassium: 4.6 mmol/L (ref 3.5–5.1)
Sodium: 138 mmol/L (ref 135–145)

## 2018-01-10 LAB — HEPATIC FUNCTION PANEL
ALT: 22 U/L (ref 0–44)
AST: 36 U/L (ref 15–41)
Albumin: 4.1 g/dL (ref 3.5–5.0)
Alkaline Phosphatase: 85 U/L (ref 38–126)
BILIRUBIN TOTAL: 1.2 mg/dL (ref 0.3–1.2)
Bilirubin, Direct: 0.5 mg/dL — ABNORMAL HIGH (ref 0.0–0.2)
Indirect Bilirubin: 0.7 mg/dL (ref 0.3–0.9)
TOTAL PROTEIN: 7.2 g/dL (ref 6.5–8.1)

## 2018-01-10 LAB — DIFFERENTIAL
Basophils Absolute: 0 10*3/uL (ref 0.0–0.1)
Basophils Relative: 0 %
EOS ABS: 0.1 10*3/uL (ref 0.0–0.7)
Eosinophils Relative: 2 %
LYMPHS PCT: 28 %
Lymphs Abs: 2 10*3/uL (ref 0.7–4.0)
MONOS PCT: 8 %
Monocytes Absolute: 0.6 10*3/uL (ref 0.1–1.0)
Neutro Abs: 4.5 10*3/uL (ref 1.7–7.7)
Neutrophils Relative %: 62 %

## 2018-01-10 LAB — I-STAT TROPONIN, ED: Troponin i, poc: 0 ng/mL (ref 0.00–0.08)

## 2018-01-10 LAB — I-STAT BETA HCG BLOOD, ED (MC, WL, AP ONLY): I-stat hCG, quantitative: <5 m[IU]/mL (ref <5)

## 2018-01-10 MED ORDER — MECLIZINE HCL 25 MG PO TABS
25.0000 mg | ORAL_TABLET | Freq: Once | ORAL | Status: AC
  Administered 2018-01-10: 25 mg via ORAL
  Filled 2018-01-10: qty 1

## 2018-01-10 MED ORDER — TRAMADOL HCL 50 MG PO TABS: ORAL_TABLET | ORAL | 0 refills | Status: DC

## 2018-01-10 MED ORDER — MECLIZINE HCL 25 MG PO TABS: 25.0000 mg | ORAL_TABLET | Freq: Three times a day (TID) | ORAL | 0 refills | Status: DC | PRN

## 2018-01-10 MED ORDER — SODIUM CHLORIDE 0.9 % IV BOLUS
1000.0000 mL | Freq: Once | INTRAVENOUS | Status: AC
  Administered 2018-01-10: 1000 mL via INTRAVENOUS

## 2018-01-10 NOTE — Telephone Encounter (Signed)
Patient called stating she has been having chest pain and difficulty breathing x 2-3 days with numbness going down her arm. I advised patient to be seen in ER. Patient verbalized understanding.

## 2018-01-10 NOTE — ED Provider Notes (Signed)
[facility]-EMERGENCY DEPT Provider Note   CSN: [id number] Arrival date & time: 01/10/18  1708     History   Chief Complaint Chief Complaint  Patient presents with  . Chest Pain  . Shortness of Breath  . arm pain/numbness    HPI Felicia Ray is a 52 y.o. female.  Patient complains of dizziness.  When she moves around feels like the room spinning.  She also has had some right-sided chest discomfort  The history is provided by the patient. No language interpreter was used.  Chest Pain   This is a new problem. The current episode started more than 2 days ago. The problem occurs constantly. The problem has not changed since onset.The pain is associated with movement. The pain is present in the substernal region. The pain is at a severity of 5/10. The pain is moderate. The pain does not radiate. Associated symptoms include shortness of breath. Pertinent negatives include no abdominal pain, no back pain, no cough and no headaches.  Pertinent negatives for past medical history include no seizures.  Shortness of Breath  Associated symptoms include chest pain. Pertinent negatives include no headaches, no cough, no abdominal pain and no rash.    Past Medical History:  Diagnosis Date  . Chronic pain syndrome 01/23/2014   Fall by slipping on water at work.  Worker's Comp case.  Followed by Dr. Corine ShelterBeane, [place],  and [facility].  Waiting for second opinion.  Not clear why left low back and knee are not improving.  Called neuropathic pain by Dr. Ethelene Halamos.  . Hypercholesteremia     Patient Active Problem List   Diagnosis Date Noted  . Hypercholesteremia   . Long term use of drug 06/12/2016  . Onychomycosis of toenail 05/01/2016  . Chronic pain 06/26/2015  . Decreased visual acuity 06/26/2015  . Hyperlipidemia 06/26/2015  . Depression 06/26/2015    Past Surgical History:  Procedure Laterality Date  . TUBAL LIGATION  07/20/2005     OB History   None       Home Medications    Prior to Admission medications   Medication Sig Start Date End Date Taking? Authorizing Provider  atorvastatin (LIPITOR) 20 MG tablet Take 1 tablet (20 mg total) by mouth daily. 04/14/17  Yes Julieanne MansonMulberry, Elizabeth, MD  gabapentin (NEURONTIN) 100 MG capsule 2 caps by mouth in morning and bedtime 04/14/17  Yes Julieanne MansonMulberry, Elizabeth, MD  niacin 500 MG tablet 2 tabs by mouth in the morning and 1 tab at night for 2 weeks, then increase to 2 tabs by mouth twice daily. Patient taking differently: Take 1,000 mg by mouth 2 (two) times daily with a meal.  11/25/17  Yes Julieanne MansonMulberry, Elizabeth, MD  Omega 3 1200 MG CAPS Take 2 capsules by mouth 2 (two) times daily.    Yes [provider]  meclizine (ANTIVERT) 25 MG tablet Take 1 tablet (25 mg total) by mouth 3 (three) times daily as needed for dizziness. 01/10/18   Bethann BerkshireZammit, Nikiya Starn, MD  meloxicam (MOBIC) 15 MG tablet Take 1 tablet (15 mg total) by mouth daily. Patient not taking: Reported on 02/18/2017 11/16/16   Bethel BornGekas, Kelly Marie, PA-C  traMADol Janean Sark(ULTRAM) 50 MG tablet Take 1 every 6-8 hours for pain not relieved by Tylenol alone 01/10/18   Bethann BerkshireZammit, Tacha Manni, MD    Family History Family History  Problem Relation Age of Onset  . Hypertension Mother   . Asthma Mother   . Hyperlipidemia Mother   . Obesity Mother   .  Diabetes Sister   . Hypertension Sister   . Hyperlipidemia Sister   . Hypertension Brother     Social History Social History   Tobacco Use  . Smoking status: Never Smoker  . Smokeless tobacco: Never Used  Substance Use Topics  . Alcohol use: No    Alcohol/week: 0.0 oz  . Drug use: No     Allergies   Aspirin and Dipyrone   Review of Systems Review of Systems  Constitutional: Negative for appetite change and fatigue.  HENT: Negative for congestion, ear discharge and sinus pressure.   Eyes: Negative for discharge.  Respiratory: Positive for shortness of breath. Negative for cough.   Cardiovascular:  Positive for chest pain.  Gastrointestinal: Negative for abdominal pain and diarrhea.  Genitourinary: Negative for frequency and hematuria.  Musculoskeletal: Negative for back pain.  Skin: Negative for rash.  Neurological: Negative for seizures and headaches.  Psychiatric/Behavioral: Negative for hallucinations.     Physical Exam Updated Vital Signs BP 123/72   Pulse 73   Temp 99 F (37.2 C) (Oral)   Resp 20   Ht 5\' 1"  (1.549 m)   Wt 80.7 kg (178 lb)   SpO2 99%   BMI 33.63 kg/m   Physical Exam  Constitutional: She is oriented to person, place, and time. She appears well-developed.  HENT:  Head: Normocephalic.  Eyes: Conjunctivae and EOM are normal. No scleral icterus.  Neck: Neck supple. No thyromegaly present.  Cardiovascular: Normal rate and regular rhythm. Exam reveals no gallop and no friction rub.  No murmur heard. Pulmonary/Chest: No stridor. She has no wheezes. She has no rales. She exhibits no tenderness.  Abdominal: She exhibits no distension. There is no tenderness. There is no rebound.  Musculoskeletal: Normal range of motion. She exhibits no edema.  Lymphadenopathy:    She has no cervical adenopathy.  Neurological: She is oriented to person, place, and time. She exhibits normal muscle tone. Coordination normal.  Skin: No rash noted. No erythema.  Psychiatric: She has a normal mood and affect. Her behavior is normal.     ED Treatments / Results  Labs (all labs ordered are listed, but only abnormal results are displayed) Labs Reviewed  CBC - Abnormal; Notable for the following components:      Result Value   RBC 5.13 (*)    Hemoglobin 15.6 (*)    All other components within normal limits  HEPATIC FUNCTION PANEL - Abnormal; Notable for the following components:   Bilirubin, Direct 0.5 (*)    All other components within normal limits  BASIC METABOLIC PANEL  DIFFERENTIAL  I-STAT TROPONIN, ED  I-STAT BETA HCG BLOOD, ED ([facility], [facility], [facility] ONLY)    EKG EKG  Interpretation  Date/Time:  Monday January 10 2018 17:28:35 EDT Ventricular Rate:  77 PR Interval:    QRS Duration: 87 QT Interval:  370 QTC Calculation: 419 R Axis:   67 Text Interpretation:  Sinus rhythm Ventricular trigeminy Confirmed by Bethann Berkshire [phone]) on 01/10/2018 8:32:47 PM   Radiology Dg Chest 2 View  Result Date: 01/10/2018 CLINICAL DATA:  Chest pain EXAM: CHEST - 2 VIEW COMPARISON:  11/16/2016 FINDINGS: Artifact from EKG leads. Normal heart size and mediastinal contours. No acute infiltrate or edema. No effusion or pneumothorax. No acute osseous findings. IMPRESSION: Negative chest. Electronically Signed   By: Marnee Spring M.D.   On: 01/10/2018 18:46   Ct Head Wo Contrast  Result Date: 01/10/2018 CLINICAL DATA:  Vertigo, episodic, peripheral. EXAM: CT HEAD WITHOUT CONTRAST  TECHNIQUE: Contiguous axial images were obtained from the base of the skull through the vertex without intravenous contrast. COMPARISON:  08/26/2016 FINDINGS: Brain: No evidence of acute infarction, hemorrhage, hydrocephalus, extra-axial collection or mass lesion/mass effect. Vascular: No hyperdense vessel or unexpected calcification. Skull: Negative Sinuses/Orbits: Negative.  Clear mastoid and middle ear spaces. IMPRESSION: Negative head CT. Electronically Signed   By: Marnee Spring M.D.   On: 01/10/2018 19:35    Procedures Procedures (including critical care time)  Medications Ordered in ED Medications  sodium chloride 0.9 % bolus 1,000 mL (1,000 mLs Intravenous New Bag/Given 01/10/18 1900)  meclizine (ANTIVERT) tablet 25 mg (25 mg Oral Given 01/10/18 1900)     Initial Impression / Assessment and Plan / ED Course  I have reviewed the triage vital signs and the nursing notes.  Pertinent labs & imaging results that were available during my care of the patient were reviewed by me and considered in my medical decision making (see chart for details).     Labs unremarkable.  Patient improved with  meclizine for the dizziness.  No chest pain improved with pain medicine.  Suspect she has some vertigo and chest wall discomfort.  She will be given Ultram and Antivert.  She also will follow-up with her PCP Final Clinical Impressions(s) / ED Diagnoses   Final diagnoses:  Vertigo  Atypical chest pain    ED Discharge Orders        Ordered    meclizine (ANTIVERT) 25 MG tablet  3 times daily PRN     01/10/18 2039    traMADol (ULTRAM) 50 MG tablet     01/10/18 2039       Bethann Berkshire, MD 01/10/18 2042

## 2018-01-10 NOTE — ED Triage Notes (Signed)
Pt to ed by POV with c/o of SOB on awakening this morning. Pt states she started to feel chest pain, arm pain/numbess, and light headed as well. Pt was not able to go to work as she felt her "equilibrium was off"  Pt states when she gets up her vision becomes blurry and pt states when she walks she feels off balance and feels like she is going to fall sideways. Pt is A & O x4

## 2018-01-10 NOTE — Discharge Instructions (Addendum)
Follow-up with your family doctor next week for recheck drink plenty of fluids ?

## 2018-01-21 ENCOUNTER — Ambulatory Visit: Payer: Self-pay | Admitting: Internal Medicine

## 2018-01-21 ENCOUNTER — Encounter: Payer: Self-pay | Admitting: Internal Medicine

## 2018-01-21 VITALS — BP 118/76 | HR 74 | Resp 12 | Ht 59.25 in | Wt 181.0 lb

## 2018-01-21 DIAGNOSIS — R42 Dizziness and giddiness: Secondary | ICD-10-CM

## 2018-01-21 NOTE — Progress Notes (Signed)
   Subjective:    Patient ID: Felicia Ray, female    DOB: 1966-04-17, 52 y.o.   MRN: [id number]  HPI   Seen in ED 01/10/18 for Vertigo:  Has not had any more episodes of vertigo since 2 days after seen.    Patient is concerned her medication is the cause of her vertigo as 2 days after being in ED, she awakened nauseated and took all of her meds:  Fish oil 1200 cap, Niacin 1000 mg, Atorvastatin, and  Gabapentin 200 mg and began to have vertigo again.  She stopped all the meds thereafter.   She had been taking the increased Niacin for about 6 weeks prior to the vertigo symptoms.    Current Meds  Medication Sig  . atorvastatin (LIPITOR) 20 MG tablet Take 1 tablet (20 mg total) by mouth daily.  Marland Kitchen. gabapentin (NEURONTIN) 100 MG capsule 2 caps by mouth in morning and bedtime  . niacin 500 MG tablet 2 tabs by mouth in the morning and 1 tab at night for 2 weeks, then increase to 2 tabs by mouth twice daily. (Patient taking differently: Take 1,000 mg by mouth 2 (two) times daily with a meal. )  . Omega 3 1200 MG CAPS Take 2 capsules by mouth 2 (two) times daily.     Allergies  Allergen Reactions  . Aspirin Other (See Comments)    Cant breath  . Dipyrone Hives    Review of Systems     Objective:   Physical Exam NAD Neuro:  Normal.  Gait normal Lungs:  CTA CV:  RRR without murmur or rub.  Radial pulses normal and equal       Assessment & Plan:  Vertigo:  Do not believe this was caused by meds. To restart one at a time, leaving 3 days in between and to keep Atorvastatin for her evening meal so she isn't taking everything all at once. Had follow up with labs in October. To start with gabapentin, then fish oil, then Atorvastatin, then to add Niacin back gradually over 9 days to 1000 mg twice daily--wrote out instructions on medication list.

## 2018-03-24 ENCOUNTER — Other Ambulatory Visit: Payer: Self-pay

## 2018-03-24 DIAGNOSIS — Z79899 Other long term (current) drug therapy: Secondary | ICD-10-CM

## 2018-03-24 DIAGNOSIS — E7800 Pure hypercholesterolemia, unspecified: Secondary | ICD-10-CM

## 2018-03-25 LAB — HEPATIC FUNCTION PANEL
ALBUMIN: 4.5 g/dL (ref 3.5–5.5)
ALK PHOS: 85 IU/L (ref 39–117)
ALT: 17 IU/L (ref 0–32)
AST: 19 IU/L (ref 0–40)
BILIRUBIN, DIRECT: 0.13 mg/dL (ref 0.00–0.40)
Bilirubin Total: 0.6 mg/dL (ref 0.0–1.2)
TOTAL PROTEIN: 6.9 g/dL (ref 6.0–8.5)

## 2018-03-25 LAB — LIPID PANEL W/O CHOL/HDL RATIO
Cholesterol, Total: 163 mg/dL (ref 100–199)
HDL: 39 mg/dL — ABNORMAL LOW (ref >39)
LDL CALC: 86 mg/dL (ref 0–99)
Triglycerides: 189 mg/dL — ABNORMAL HIGH (ref 0–149)
VLDL Cholesterol Cal: 38 mg/dL (ref 5–40)

## 2018-03-31 ENCOUNTER — Encounter: Payer: Self-pay | Admitting: Internal Medicine

## 2018-03-31 ENCOUNTER — Ambulatory Visit: Payer: Self-pay | Admitting: Internal Medicine

## 2018-03-31 VITALS — BP 102/60 | HR 86 | Resp 12 | Ht 59.25 in | Wt 179.0 lb

## 2018-03-31 DIAGNOSIS — Z1239 Encounter for other screening for malignant neoplasm of breast: Secondary | ICD-10-CM

## 2018-03-31 DIAGNOSIS — R42 Dizziness and giddiness: Secondary | ICD-10-CM

## 2018-03-31 DIAGNOSIS — E782 Mixed hyperlipidemia: Secondary | ICD-10-CM

## 2018-03-31 DIAGNOSIS — R400 Somnolence: Secondary | ICD-10-CM

## 2018-03-31 DIAGNOSIS — Z6835 Body mass index (BMI) 35.0-35.9, adult: Secondary | ICD-10-CM

## 2018-03-31 NOTE — Progress Notes (Signed)
   Subjective:    Patient ID: Felicia Ray, female    DOB: 1966-03-18, 52 y.o.   MRN: [id number]  HPI   1.  Vertigo:  Was able to gradually to add back her medications and she is tolerating now without vertigo.  2.  Hyperlipidemia  3.  Sleepy:  Sleeping well, but also during the day.  She took the same amount of Gabapentin before last visit, but did not have sleepiness then.   She denies taking Meclizine for some time now.  She cannot remember date of last dose.  No over the counter medications not in med list. Feels her daytime sleepiness has been for 20 days. Last period was in August.  Was heavy and lasted 12 days. No melena or hematochezia. Thirsty all the time.  No polyuria.   She does snore according to her husband.  No apnea.  Maybe about 6 months her husband has been complaining for this.  Current Meds  Medication Sig  . atorvastatin (LIPITOR) 20 MG tablet Take 1 tablet (20 mg total) by mouth daily.  Marland Kitchen gabapentin (NEURONTIN) 100 MG capsule 2 caps by mouth in morning and bedtime  . niacin 500 MG tablet 2 tabs by mouth in the morning and 1 tab at night for 2 weeks, then increase to 2 tabs by mouth twice daily. (Patient taking differently: Take 1,000 mg by mouth 2 (two) times daily with a meal. )  . Omega 3 1200 MG CAPS Take 2 capsules by mouth 2 (two) times daily.     Allergies  Allergen Reactions  . Aspirin Other (See Comments)    Cant breath  . Dipyrone Hives        Review of Systems     Objective:   Physical Exam NAD HEENT: Palpebral conjunctivae pale Neck:  Supple, No adenopathy, no thyromegaly Chest:  CTA CV: RRR without murmur or rub.  Radial and DP pulses normal and equal Abd:  S, NT, No HSM or mass, + BS LE:  No edema.       Assessment & Plan:  1.  Vertigo: resolved and back on meds.  2.  Hyperlipidemia:  Triglycerides finally getting to a more normal range.  Still low with HDL.  Continue to work on lifestyle changes and weight loss.  3.   Sleepiness:  CBC, CMP, TSH.  Possible OSA.  Discussed work up would require financial assistance through [employer]. She will contemplate. Encouraged weight loss for the possibility of OSA as well.  4.  HM:  States did not get letter for repeat mammogram.  Order written. Rx for free flu vaccine at [facility].

## 2018-04-01 LAB — COMPREHENSIVE METABOLIC PANEL
ALBUMIN: 4.2 g/dL (ref 3.5–5.5)
ALK PHOS: 90 IU/L (ref 39–117)
ALT: 16 IU/L (ref 0–32)
AST: 16 IU/L (ref 0–40)
Albumin/Globulin Ratio: 1.7 (ref 1.2–2.2)
BILIRUBIN TOTAL: 0.3 mg/dL (ref 0.0–1.2)
BUN / CREAT RATIO: 29 — AB (ref 9–23)
BUN: 17 mg/dL (ref 6–24)
CHLORIDE: 100 mmol/L (ref 96–106)
CO2: 25 mmol/L (ref 20–29)
Calcium: 9.1 mg/dL (ref 8.7–10.2)
Creatinine, Ser: 0.58 mg/dL (ref 0.57–1.00)
GFR calc Af Amer: 124 mL/min/{1.73_m2} (ref >59)
GFR calc non Af Amer: 108 mL/min/{1.73_m2} (ref >59)
GLOBULIN, TOTAL: 2.5 g/dL (ref 1.5–4.5)
GLUCOSE: 70 mg/dL (ref 65–99)
Potassium: 4.1 mmol/L (ref 3.5–5.2)
Sodium: 142 mmol/L (ref 134–144)
Total Protein: 6.7 g/dL (ref 6.0–8.5)

## 2018-04-01 LAB — CBC WITH DIFFERENTIAL/PLATELET
BASOS ABS: 0 10*3/uL (ref 0.0–0.2)
Basos: 0 %
EOS (ABSOLUTE): 0.2 10*3/uL (ref 0.0–0.4)
Eos: 3 %
HEMOGLOBIN: 14.3 g/dL (ref 11.1–15.9)
Hematocrit: 41.3 % (ref 34.0–46.6)
IMMATURE GRANULOCYTES: 0 %
Immature Grans (Abs): 0 10*3/uL (ref 0.0–0.1)
LYMPHS ABS: 1.4 10*3/uL (ref 0.7–3.1)
Lymphs: 24 %
MCH: 31 pg (ref 26.6–33.0)
MCHC: 34.6 g/dL (ref 31.5–35.7)
MCV: 90 fL (ref 79–97)
Monocytes Absolute: 0.5 10*3/uL (ref 0.1–0.9)
Monocytes: 8 %
NEUTROS PCT: 65 %
Neutrophils Absolute: 3.9 10*3/uL (ref 1.4–7.0)
Platelets: 245 10*3/uL (ref 150–450)
RBC: 4.61 x10E6/uL (ref 3.77–5.28)
RDW: 14.3 % (ref 12.3–15.4)
WBC: 6.1 10*3/uL (ref 3.4–10.8)

## 2018-04-01 LAB — TSH: TSH: 2.75 u[IU]/mL (ref 0.450–4.500)

## 2018-07-11 ENCOUNTER — Telehealth: Payer: Self-pay | Admitting: Internal Medicine

## 2018-07-11 ENCOUNTER — Other Ambulatory Visit: Payer: Self-pay

## 2018-07-11 DIAGNOSIS — G894 Chronic pain syndrome: Secondary | ICD-10-CM

## 2018-07-11 MED ORDER — ATORVASTATIN CALCIUM 20 MG PO TABS: 20.0000 mg | ORAL_TABLET | Freq: Every day | ORAL | 11 refills | Status: DC

## 2018-07-11 MED ORDER — GABAPENTIN 100 MG PO CAPS: ORAL_CAPSULE | ORAL | 11 refills | Status: DC

## 2018-07-11 NOTE — Telephone Encounter (Signed)
Patient came in requesting medication refills for the following medications  gabapentin (NEURONTIN) 100 MG capsule  atorvastation (LIPITOR) 20 MG tablet   Please send over to the [facility] pharmacy and notify pt.

## 2018-07-11 NOTE — Telephone Encounter (Signed)
RX sent to [facility] pharmacy. Please notify patient

## 2018-07-13 NOTE — Telephone Encounter (Signed)
LVM notifying patient.

## 2018-08-04 ENCOUNTER — Encounter: Payer: Self-pay | Admitting: Internal Medicine

## 2018-08-04 ENCOUNTER — Ambulatory Visit: Payer: Self-pay | Admitting: Internal Medicine

## 2018-08-04 VITALS — BP 102/72 | HR 80 | Resp 12 | Ht 59.25 in | Wt 180.0 lb

## 2018-08-04 DIAGNOSIS — E782 Mixed hyperlipidemia: Secondary | ICD-10-CM

## 2018-08-04 DIAGNOSIS — Z6835 Body mass index (BMI) 35.0-35.9, adult: Secondary | ICD-10-CM

## 2018-08-04 DIAGNOSIS — H04123 Dry eye syndrome of bilateral lacrimal glands: Secondary | ICD-10-CM

## 2018-08-04 DIAGNOSIS — R400 Somnolence: Secondary | ICD-10-CM

## 2018-08-04 DIAGNOSIS — Z9109 Other allergy status, other than to drugs and biological substances: Secondary | ICD-10-CM | POA: Insufficient documentation

## 2018-08-04 MED ORDER — KETOTIFEN FUMARATE 0.025 % OP SOLN: 1.0000 [drp] | Freq: Two times a day (BID) | OPHTHALMIC | 0 refills | Status: DC

## 2018-08-04 MED ORDER — CETIRIZINE HCL 10 MG PO TABS: 10.0000 mg | ORAL_TABLET | Freq: Every day | ORAL | 11 refills | Status: DC

## 2018-08-04 NOTE — Progress Notes (Signed)
    Subjective:    Patient ID: Felicia Ray, female   DOB: [birth year], 53 y.o.   MRN: [id number]   HPI   1.  Daytime sleepiness:  Resolved.  Hasn't noted for about 1 month.  Maybe just the stress of the holidays.  Was sending money to [place] and had a lot of family problems. Her daughter states she does still snore.  She still is not ready to consider a sleep study due to not knowing the cost.  Discussed [employer].   2.  Hyperlipidemia:  Last checked in October and almost at goal.  HDL main thing out of goal at 33.  Trigs as well.  Takes Atorvastatin, Niacin, fish oil.  Not physically active.   She is willing to figure out something physically active to do    3.  Eyes feel dry. They burn as well.  Feels like dust in eyes.  Uses moisturizing eye drops, but not helping. No itchy nose, throat or sneezing.    Current Meds  Medication Sig  . atorvastatin (LIPITOR) 20 MG tablet Take 1 tablet (20 mg total) by mouth daily.  Marland Kitchen gabapentin (NEURONTIN) 100 MG capsule 2 caps by mouth in morning and bedtime  . niacin 500 MG tablet 2 tabs by mouth in the morning and 1 tab at night for 2 weeks, then increase to 2 tabs by mouth twice daily. (Patient taking differently: Take 1,000 mg by mouth 2 (two) times daily with a meal. )  . Omega 3 1200 MG CAPS Take 2 capsules by mouth 2 (two) times daily.    Allergies  Allergen Reactions  . Aspirin Other (See Comments)    Cant breath  . Dipyrone Hives     Review of Systems    Objective:   BP 102/72 (BP Location: Left Arm, Patient Position: Sitting, Cuff Size: Normal)   Pulse 80   Resp 12   Ht 4' 11.25" (1.505 m)   Wt 180 lb (81.6 kg)   LMP  (LMP Unknown)   BMI 36.05 kg/m   Physical Exam  NAD HEENT:  PERRL, EOMI, Left lid ptosis--chronic.  Left conjunctivae with injection, nasal mucosa swollen and boggy, posterior pharynx with cobbling. MMM.  TMs pearly gray, Neck:  Supple, No adenopathy Chest:  CTA CV:  RRR without  murmur or rub.  Radial pulses normal and equal  Assessment & Plan   1.  Daytime somnolence:  No longer an issue.  Still snoring quite significantly, but still not sure she wants to proceed with sleep study.  Will think upon it some more.  2.  Hyperlipidemia/obesity:  Will work more on lifestyle changes.  If has done so significantly by next visit, will set her up with repeat FLP.  3.  Irritated eyes:  Appears to actually have allergies with other physical findings.  Zyrtec 10 mg daily and Zaditor 1 gtt OU twice daily.  If worsens, to let me know.

## 2018-10-06 ENCOUNTER — Ambulatory Visit: Payer: Self-pay | Admitting: Internal Medicine

## 2018-12-08 ENCOUNTER — Encounter: Payer: Self-pay | Admitting: Internal Medicine

## 2018-12-08 ENCOUNTER — Ambulatory Visit: Payer: Self-pay | Admitting: Internal Medicine

## 2018-12-08 ENCOUNTER — Other Ambulatory Visit: Payer: Self-pay

## 2018-12-08 DIAGNOSIS — Z6835 Body mass index (BMI) 35.0-35.9, adult: Secondary | ICD-10-CM

## 2018-12-08 DIAGNOSIS — E782 Mixed hyperlipidemia: Secondary | ICD-10-CM

## 2018-12-08 DIAGNOSIS — Z9109 Other allergy status, other than to drugs and biological substances: Secondary | ICD-10-CM

## 2018-12-08 NOTE — Progress Notes (Signed)
    Subjective:    Patient ID: Felicia Ray, female   DOB: 11-Jan-1966, 53 y.o.   MRN: [id number]   HPI   1.  Weight:  Weight down 3 lbs.   About 1 month ago, she changed her diet as she was eating poorly and gaining weight.  She was up to 186 lbs.   In the morning she eats 1 g carb and eats eggs, spinach, tiny bit of coconut or grapeseed, ham and cheese.   Previously would eat 7-9 corn tortillas at a meal Drinking lots of water.  Lunch:  Meat:  Chicken, fish, [place]:  Green salad. Vegetables.  Vegetable smoothie late morning--adds this.    Dinner:  Salad, ham, cheese.  Maybe a fish filet or chicken breast.    Walking regularly.  Her neighbor is working on diet and physical activity with her.   Doing okay with food and rent   2.  Allergies:  Doing well without eye drops and only taking Zyrtec.  Her eyes are no longer bothering her.     Current Meds  Medication Sig  . atorvastatin (LIPITOR) 20 MG tablet Take 1 tablet (20 mg total) by mouth daily.  . cetirizine (ZYRTEC) 10 MG tablet Take 1 tablet (10 mg total) by mouth daily.  Marland Kitchen gabapentin (NEURONTIN) 100 MG capsule 2 caps by mouth in morning and bedtime  . niacin 500 MG tablet 2 tabs by mouth in the morning and 1 tab at night for 2 weeks, then increase to 2 tabs by mouth twice daily. (Patient taking differently: Take 1,000 mg by mouth 2 (two) times daily with a meal. )  . Omega 3 1200 MG CAPS Take 2 capsules by mouth 2 (two) times daily.     Allergies  Allergen Reactions  . Aspirin Other (See Comments)    Cant breath  . Dipyrone Hives     Review of Systems    Objective:   Blood pressure 100/68, pulse 68, temperature 99.1 F (37.3 C), temperature source Temporal, resp. rate 12, height 4' 11.25" (1.505 m), weight 177 lb (80.3 kg).   Physical Exam  NAD HEENT:  PERRL, EOMI, TMs pearly gray, nasal mucosa without swelling, no drainage. Throat without cobbling or erythema Neck:  Supple, No adenopathy Chest:  CTA  CV:  RRR without murmur or rub.  Radial and DP pulses normal and equal Abd:  S, NT, No HSM or mass, + BS. LE:  No edema   Assessment & Plan  1.  Obesity:  Has made significant lifestyle changes recently and down 3 lbs.    2.  Hyperlipidemia:  FLP in 2 months to see how her lifestyle changes have affected cholesterol panel.  3  Allergies:  Good control with Zyrtec and Zaditor eye drops.

## 2019-01-02 ENCOUNTER — Other Ambulatory Visit: Payer: Self-pay

## 2019-01-02 DIAGNOSIS — Z20822 Contact with and (suspected) exposure to covid-19: Secondary | ICD-10-CM

## 2019-01-02 NOTE — Addendum Note (Signed)
Addended by: Brigitte Pulse on: 01/02/2019 08:45 PM   Modules accepted: Orders

## 2019-01-04 ENCOUNTER — Telehealth: Payer: Self-pay | Admitting: Internal Medicine

## 2019-01-04 LAB — NOVEL CORONAVIRUS, NAA: SARS-CoV-2, NAA: DETECTED — AB

## 2019-01-04 NOTE — Telephone Encounter (Signed)
Patient called requesting if there are any  medication can be prescribed after turned positive with COVID19. After advise patient to take tylenol or ibuprofen for pain and fever and to drink plenty of fluids along with a healthy diet;  discuss al proper measures for Corona and safety measures for everybody in the house.  Patient verbalized understanding.

## 2019-01-12 ENCOUNTER — Other Ambulatory Visit: Payer: Self-pay

## 2019-01-12 ENCOUNTER — Encounter (HOSPITAL_COMMUNITY): Payer: Self-pay | Admitting: Internal Medicine

## 2019-01-12 ENCOUNTER — Inpatient Hospital Stay (HOSPITAL_COMMUNITY)
Admission: EM | Admit: 2019-01-12 | Discharge: 2019-01-15 | DRG: 177 | Disposition: A | Discharge status: Home / Self Care | Payer: Medicaid Other | Attending: Family Medicine | Admitting: Family Medicine

## 2019-01-12 ENCOUNTER — Emergency Department (HOSPITAL_COMMUNITY): Payer: Medicaid Other

## 2019-01-12 DIAGNOSIS — Z833 Family history of diabetes mellitus: Secondary | ICD-10-CM | POA: Diagnosis not present

## 2019-01-12 DIAGNOSIS — R0603 Acute respiratory distress: Secondary | ICD-10-CM | POA: Diagnosis present

## 2019-01-12 DIAGNOSIS — J1289 Other viral pneumonia: Secondary | ICD-10-CM | POA: Diagnosis present

## 2019-01-12 DIAGNOSIS — J1282 Pneumonia due to coronavirus disease 2019: Secondary | ICD-10-CM | POA: Diagnosis present

## 2019-01-12 DIAGNOSIS — Z8249 Family history of ischemic heart disease and other diseases of the circulatory system: Secondary | ICD-10-CM

## 2019-01-12 DIAGNOSIS — Z23 Encounter for immunization: Secondary | ICD-10-CM

## 2019-01-12 DIAGNOSIS — Z9851 Tubal ligation status: Secondary | ICD-10-CM | POA: Diagnosis not present

## 2019-01-12 DIAGNOSIS — Z825 Family history of asthma and other chronic lower respiratory diseases: Secondary | ICD-10-CM

## 2019-01-12 DIAGNOSIS — G8929 Other chronic pain: Secondary | ICD-10-CM | POA: Diagnosis present

## 2019-01-12 DIAGNOSIS — J45909 Unspecified asthma, uncomplicated: Secondary | ICD-10-CM | POA: Diagnosis present

## 2019-01-12 DIAGNOSIS — E781 Pure hyperglyceridemia: Secondary | ICD-10-CM | POA: Diagnosis present

## 2019-01-12 DIAGNOSIS — Z888 Allergy status to other drugs, medicaments and biological substances status: Secondary | ICD-10-CM | POA: Diagnosis not present

## 2019-01-12 DIAGNOSIS — Z8349 Family history of other endocrine, nutritional and metabolic diseases: Secondary | ICD-10-CM | POA: Diagnosis not present

## 2019-01-12 DIAGNOSIS — J4541 Moderate persistent asthma with (acute) exacerbation: Secondary | ICD-10-CM

## 2019-01-12 DIAGNOSIS — E785 Hyperlipidemia, unspecified: Secondary | ICD-10-CM | POA: Diagnosis present

## 2019-01-12 DIAGNOSIS — U071 COVID-19: Principal | ICD-10-CM | POA: Diagnosis present

## 2019-01-12 DIAGNOSIS — Z79899 Other long term (current) drug therapy: Secondary | ICD-10-CM | POA: Diagnosis not present

## 2019-01-12 DIAGNOSIS — Z791 Long term (current) use of non-steroidal anti-inflammatories (NSAID): Secondary | ICD-10-CM

## 2019-01-12 DIAGNOSIS — Z886 Allergy status to analgesic agent status: Secondary | ICD-10-CM | POA: Diagnosis not present

## 2019-01-12 DIAGNOSIS — G4733 Obstructive sleep apnea (adult) (pediatric): Secondary | ICD-10-CM | POA: Diagnosis present

## 2019-01-12 HISTORY — DX: Unspecified asthma, uncomplicated: J45.909

## 2019-01-12 HISTORY — DX: Hyperlipidemia, unspecified: E78.5

## 2019-01-12 HISTORY — DX: Other chronic pain: G89.29

## 2019-01-12 LAB — LACTIC ACID, PLASMA
Lactic Acid, Venous: 2 mmol/L (ref 0.5–1.9)
Lactic Acid, Venous: 1.6 mmol/L (ref 0.5–1.9)

## 2019-01-12 LAB — CBC WITH DIFFERENTIAL/PLATELET
Abs Immature Granulocytes: 0.02 10*3/uL (ref 0.00–0.07)
Basophils Absolute: 0 10*3/uL (ref 0.0–0.1)
Basophils Relative: 0 %
Eosinophils Absolute: 0.2 10*3/uL (ref 0.0–0.5)
Eosinophils Relative: 2 %
HCT: 39.6 % (ref 36.0–46.0)
Hemoglobin: 13.6 g/dL (ref 12.0–15.0)
Immature Granulocytes: 0 %
Lymphocytes Relative: 26 %
Lymphs Abs: 1.8 10*3/uL (ref 0.7–4.0)
MCH: 29.8 pg (ref 26.0–34.0)
MCHC: 34.3 g/dL (ref 30.0–36.0)
MCV: 86.7 fL (ref 80.0–100.0)
Monocytes Absolute: 0.5 10*3/uL (ref 0.1–1.0)
Monocytes Relative: 7 %
Neutro Abs: 4.5 10*3/uL (ref 1.7–7.7)
Neutrophils Relative %: 65 %
Platelets: 298 10*3/uL (ref 150–400)
RBC: 4.57 MIL/uL (ref 3.87–5.11)
RDW: 12.6 % (ref 11.5–15.5)
WBC: 7.1 10*3/uL (ref 4.0–10.5)
nRBC: 0 % (ref 0.0–0.2)

## 2019-01-12 LAB — POCT I-STAT 7, (LYTES, BLD GAS, ICA,H+H)
Acid-base deficit: 4 mmol/L — ABNORMAL HIGH (ref 0.0–2.0)
Bicarbonate: 19.1 mmol/L — ABNORMAL LOW (ref 20.0–28.0)
Calcium, Ion: 1.18 mmol/L (ref 1.15–1.40)
HCT: 38 % (ref 36.0–46.0)
Hemoglobin: 12.9 g/dL (ref 12.0–15.0)
O2 Saturation: 100 %
Patient temperature: 98.6
Potassium: 3.3 mmol/L — ABNORMAL LOW (ref 3.5–5.1)
Sodium: 140 mmol/L (ref 135–145)
TCO2: 20 mmol/L — ABNORMAL LOW (ref 22–32)
pCO2 arterial: 28.1 mmHg — ABNORMAL LOW (ref 32.0–48.0)
pH, Arterial: 7.44 (ref 7.350–7.450)
pO2, Arterial: 182 mmHg — ABNORMAL HIGH (ref 83.0–108.0)

## 2019-01-12 LAB — COMPREHENSIVE METABOLIC PANEL
ALT: 31 U/L (ref 0–44)
AST: 26 U/L (ref 15–41)
Albumin: 3.7 g/dL (ref 3.5–5.0)
Alkaline Phosphatase: 79 U/L (ref 38–126)
Anion gap: 8 (ref 5–15)
BUN: 19 mg/dL (ref 6–20)
CO2: 20 mmol/L — ABNORMAL LOW (ref 22–32)
Calcium: 9.1 mg/dL (ref 8.9–10.3)
Chloride: 109 mmol/L (ref 98–111)
Creatinine, Ser: 0.91 mg/dL (ref 0.44–1.00)
GFR calc Af Amer: >60 mL/min (ref >60)
GFR calc non Af Amer: >60 mL/min (ref >60)
Glucose, Bld: 106 mg/dL — ABNORMAL HIGH (ref 70–99)
Potassium: 4 mmol/L (ref 3.5–5.1)
Sodium: 137 mmol/L (ref 135–145)
Total Bilirubin: 0.7 mg/dL (ref 0.3–1.2)
Total Protein: 6.9 g/dL (ref 6.5–8.1)

## 2019-01-12 LAB — ABO/RH: ABO/RH(D): O POS

## 2019-01-12 LAB — FIBRINOGEN: Fibrinogen: 544 mg/dL — ABNORMAL HIGH (ref 210–475)

## 2019-01-12 LAB — I-STAT BETA HCG BLOOD, ED (MC, WL, AP ONLY): I-stat hCG, quantitative: <5 m[IU]/mL (ref <5)

## 2019-01-12 LAB — TRIGLYCERIDES: Triglycerides: 745 mg/dL — ABNORMAL HIGH (ref <150)

## 2019-01-12 LAB — PROCALCITONIN: Procalcitonin: <0.1 ng/mL

## 2019-01-12 LAB — FERRITIN: Ferritin: 143 ng/mL (ref 11–307)

## 2019-01-12 LAB — C-REACTIVE PROTEIN: CRP: 3.2 mg/dL — ABNORMAL HIGH (ref <1.0)

## 2019-01-12 LAB — LACTATE DEHYDROGENASE: LDH: 181 U/L (ref 98–192)

## 2019-01-12 LAB — SARS CORONAVIRUS 2 BY RT PCR (HOSPITAL ORDER, PERFORMED IN ~~LOC~~ HOSPITAL LAB): SARS Coronavirus 2: POSITIVE — AB

## 2019-01-12 LAB — D-DIMER, QUANTITATIVE: D-Dimer, Quant: 0.45 ug/mL-FEU (ref 0.00–0.50)

## 2019-01-12 MED ORDER — ONDANSETRON HCL 4 MG/2ML IJ SOLN
4.0000 mg | Freq: Four times a day (QID) | INTRAMUSCULAR | Status: DC | PRN
  Administered 2019-01-12 – 2019-01-13 (×2): 4 mg via INTRAVENOUS
  Filled 2019-01-12 (×2): qty 2

## 2019-01-12 MED ORDER — PNEUMOCOCCAL VAC POLYVALENT 25 MCG/0.5ML IJ INJ
0.5000 mL | INJECTION | INTRAMUSCULAR | Status: AC
  Administered 2019-01-13: 0.5 mL via INTRAMUSCULAR

## 2019-01-12 MED ORDER — ALBUTEROL SULFATE HFA 108 (90 BASE) MCG/ACT IN AERS
8.0000 | INHALATION_SPRAY | Freq: Once | RESPIRATORY_TRACT | Status: AC
  Administered 2019-01-12: 8 via RESPIRATORY_TRACT
  Filled 2019-01-12: qty 6.7

## 2019-01-12 MED ORDER — ACETAMINOPHEN 325 MG PO TABS
650.0000 mg | ORAL_TABLET | Freq: Four times a day (QID) | ORAL | Status: DC | PRN
  Administered 2019-01-12 – 2019-01-13 (×3): 650 mg via ORAL
  Filled 2019-01-12 (×3): qty 2

## 2019-01-12 MED ORDER — METHYLPREDNISOLONE SODIUM SUCC 40 MG IJ SOLR
40.0000 mg | Freq: Two times a day (BID) | INTRAMUSCULAR | Status: DC
  Administered 2019-01-12 – 2019-01-13 (×3): 40 mg via INTRAVENOUS
  Filled 2019-01-12 (×4): qty 1

## 2019-01-12 MED ORDER — ADULT MULTIVITAMIN W/MINERALS CH
1.0000 | ORAL_TABLET | Freq: Every day | ORAL | Status: DC
  Administered 2019-01-13 – 2019-01-15 (×3): 1 via ORAL
  Filled 2019-01-12 (×3): qty 1

## 2019-01-12 MED ORDER — HYDROCOD POLST-CPM POLST ER 10-8 MG/5ML PO SUER
5.0000 mL | Freq: Two times a day (BID) | ORAL | Status: DC | PRN
  Administered 2019-01-12 – 2019-01-15 (×4): 5 mL via ORAL
  Filled 2019-01-12 (×5): qty 5

## 2019-01-12 MED ORDER — BENZONATATE 100 MG PO CAPS
200.0000 mg | ORAL_CAPSULE | Freq: Once | ORAL | Status: AC
  Administered 2019-01-12: 200 mg via ORAL
  Filled 2019-01-12: qty 2

## 2019-01-12 MED ORDER — SODIUM CHLORIDE 0.9 % IV BOLUS
500.0000 mL | Freq: Once | INTRAVENOUS | Status: AC
  Administered 2019-01-12: 500 mL via INTRAVENOUS

## 2019-01-12 MED ORDER — GUAIFENESIN-DM 100-10 MG/5ML PO SYRP
10.0000 mL | ORAL_SOLUTION | ORAL | Status: DC | PRN
  Administered 2019-01-12 – 2019-01-15 (×7): 10 mL via ORAL
  Filled 2019-01-12 (×7): qty 10

## 2019-01-12 MED ORDER — ALBUTEROL SULFATE HFA 108 (90 BASE) MCG/ACT IN AERS
4.0000 | INHALATION_SPRAY | RESPIRATORY_TRACT | Status: DC | PRN
  Administered 2019-01-12 – 2019-01-13 (×3): 4 via RESPIRATORY_TRACT
  Filled 2019-01-12: qty 6.7

## 2019-01-12 MED ORDER — IPRATROPIUM-ALBUTEROL 20-100 MCG/ACT IN AERS
1.0000 | INHALATION_SPRAY | Freq: Four times a day (QID) | RESPIRATORY_TRACT | Status: DC
  Administered 2019-01-12 – 2019-01-15 (×10): 1 via RESPIRATORY_TRACT
  Filled 2019-01-12: qty 4

## 2019-01-12 MED ORDER — ENOXAPARIN SODIUM 40 MG/0.4ML ~~LOC~~ SOLN
40.0000 mg | SUBCUTANEOUS | Status: DC
  Administered 2019-01-12 – 2019-01-14 (×3): 40 mg via SUBCUTANEOUS
  Filled 2019-01-12 (×3): qty 0.4

## 2019-01-12 NOTE — ED Provider Notes (Signed)
[facility] Provider Note   CSN: [id number] Arrival date & time: 01/12/19  1602    History   Chief Complaint Chief Complaint  Patient presents with  . Shortness of Breath    HPI Felicia Ray is a 53 y.o. female with history of asthma and recent COVID-19 diagnosis presents to the ED complaining of cough, shortness of breath, and left-sided chest pain.  Patient reports she has been coughing for the past 12 days and began developing chest pain yesterday.  She reports her chest pain is worse with coughing and deep breathing.  She reports that today she became increasingly short of breath so she called EMS.  Per EMS, patient was tachypneic with respiratory rate in the 40s on arrival with some improvement in her respiratory rate after supplemental oxygen was applied.  Patient reports her last fever was a couple of days ago.  She denies any abdominal pain, nausea, vomiting, diarrhea, or any other complaints.     The history is provided by the patient and the spouse. A language interpreter was used.  Shortness of Breath Severity:  Severe Onset quality:  Gradual Duration:  1 day Timing:  Constant Progression:  Worsening Chronicity:  New Context comment:  COVID-19 diagnosis Relieved by:  Oxygen Worsened by:  Deep breathing and coughing Associated symptoms: chest pain, cough and fever   Associated symptoms: no abdominal pain, no diaphoresis, no ear pain, no hemoptysis, no rash, no sore throat and no vomiting     Past Medical History:  Diagnosis Date  . Asthma   . Chronic pain   . HLD (hyperlipidemia)     Patient Active Problem List   Diagnosis Date Noted  . Pneumonia due to COVID-19 virus 01/12/2019  . Asthma 01/12/2019  . HLD (hyperlipidemia) 01/12/2019    Past Surgical History:  Procedure Laterality Date  . TUBAL LIGATION       OB History   No obstetric history on file.      Home Medications    Prior to Admission  medications   Medication Sig Start Date End Date Taking? Authorizing Provider  guaiFENesin (MUCINEX) 600 MG 12 hr tablet Take 600-1,200 mg by mouth every 12 (twelve) hours.   Yes [provider]  ibuprofen (ADVIL) 200 MG tablet Take 200-600 mg by mouth every 8 (eight) hours.   Yes [provider]  Multiple Vitamin (MULTIVITAMIN WITH MINERALS) TABS tablet Take 1 tablet by mouth daily with breakfast.   Yes [provider]    Family History Family History  Problem Relation Age of Onset  . Hypertension Mother   . Asthma Mother   . Hyperlipidemia Mother   . Diabetes Sister   . Hypertension Sister   . Hyperlipidemia Sister   . Hypertension Brother     Social History Social History   Tobacco Use  . Smoking status: Never Smoker  . Smokeless tobacco: Never Used  Substance Use Topics  . Alcohol use: Never    Frequency: Never  . Drug use: Never     Allergies   Aspirin and Dipyrone   Review of Systems Review of Systems  Constitutional: Positive for fever. Negative for diaphoresis.  HENT: Negative for ear pain and sore throat.   Eyes: Negative for pain and visual disturbance.  Respiratory: Positive for cough and shortness of breath. Negative for hemoptysis.   Cardiovascular: Positive for chest pain. Negative for palpitations.  Gastrointestinal: Negative for abdominal pain and vomiting.  Genitourinary: Negative for  dysuria and hematuria.  Musculoskeletal: Negative for arthralgias and back pain.  Skin: Negative for color change and rash.  Neurological: Negative for seizures and syncope.  All other systems reviewed and are negative.    Physical Exam Updated Vital Signs BP 117/65 (BP Location: Left Arm)   Pulse 79   Temp (!) 97.3 F (36.3 C) (Oral)   Resp 19   Ht 5\' 1"  (1.549 m)   Wt 79.4 kg   SpO2 97%   BMI 33.07 kg/m   Physical Exam Vitals signs and nursing note reviewed.  Constitutional:      General: She is in acute distress.      Appearance: Normal appearance. She is ill-appearing.  HENT:     Head: Normocephalic and atraumatic.     Nose: Nose normal. No congestion or rhinorrhea.     Mouth/Throat:     Mouth: Mucous membranes are moist.     Pharynx: Oropharynx is clear. No oropharyngeal exudate or posterior oropharyngeal erythema.  Eyes:     Extraocular Movements: Extraocular movements intact.     Pupils: Pupils are equal, round, and reactive to light.  Neck:     Musculoskeletal: Normal range of motion and neck supple. No neck rigidity or muscular tenderness.  Cardiovascular:     Rate and Rhythm: Normal rate and regular rhythm.     Pulses: Normal pulses.     Heart sounds: Normal heart sounds. No murmur. No friction rub. No gallop.   Pulmonary:     Effort: Respiratory distress present.     Comments: Tachypneic, decreased air movement, coughing frequently, speaking in short sentences. Abdominal:     General: Abdomen is flat. There is no distension.     Palpations: Abdomen is soft.     Tenderness: There is no abdominal tenderness. There is no guarding or rebound.  Musculoskeletal: Normal range of motion.        General: No swelling, tenderness, deformity or signs of injury.  Skin:    General: Skin is warm and dry.  Neurological:     General: No focal deficit present.     Mental Status: She is alert and oriented to person, place, and time. Mental status is at baseline.     Cranial Nerves: No cranial nerve deficit.     Sensory: No sensory deficit.     Motor: No weakness.  Psychiatric:        Mood and Affect: Mood normal.        Behavior: Behavior normal.      ED Treatments / Results  Labs (all labs ordered are listed, but only abnormal results are displayed) Labs Reviewed  SARS CORONAVIRUS 2 ([facility]) - Abnormal; Notable for the following components:      Result Value   SARS Coronavirus 2 POSITIVE (*)    All other components within normal limits  LACTIC  ACID, PLASMA - Abnormal; Notable for the following components:   Lactic Acid, Venous 2.0 (*)    All other components within normal limits  COMPREHENSIVE METABOLIC PANEL - Abnormal; Notable for the following components:   CO2 20 (*)    Glucose, Bld 106 (*)    All other components within normal limits  TRIGLYCERIDES - Abnormal; Notable for the following components:   Triglycerides 745 (*)    All other components within normal limits  FIBRINOGEN - Abnormal; Notable for the following components:   Fibrinogen 544 (*)    All other components within normal limits  C-REACTIVE PROTEIN - Abnormal; Notable for the following components:   CRP 3.2 (*)    All other components within normal limits  POCT I-STAT 7, (LYTES, BLD GAS, ICA,H+H) - Abnormal; Notable for the following components:   pCO2 arterial 28.1 (*)    pO2, Arterial 182.0 (*)    Bicarbonate 19.1 (*)    TCO2 20 (*)    Acid-base deficit 4.0 (*)    Potassium 3.3 (*)    All other components within normal limits  CULTURE, BLOOD (ROUTINE X 2)  CULTURE, BLOOD (ROUTINE X 2)  MRSA PCR SCREENING  LACTIC ACID, PLASMA  CBC WITH DIFFERENTIAL/PLATELET  D-DIMER, QUANTITATIVE (NOT AT [facility])  PROCALCITONIN  LACTATE DEHYDROGENASE  FERRITIN  HIV ANTIBODY (ROUTINE TESTING W REFLEX)  CBC  COMPREHENSIVE METABOLIC PANEL  LACTATE DEHYDROGENASE  D-DIMER, QUANTITATIVE (NOT AT [facility])  FERRITIN  BLOOD GAS, ARTERIAL  LIPID PANEL  I-STAT BETA HCG BLOOD, ED ([facility], [facility], [facility] ONLY)  ABO/RH    EKG EKG Interpretation  Date/Time:  Thursday January 12 2019 16:13:05 EDT Ventricular Rate:  74 PR Interval:    QRS Duration: 79 QT Interval:  369 QTC Calculation: 410 R Axis:   67 Text Interpretation:  Sinus rhythm Low voltage, precordial leads No acute changes No significant change since last tracing Confirmed by Derwood Kaplananavati, Ankit ([id number]) on 01/12/2019 4:42:25 PM   Radiology Dg Chest Port 1 View  Result Date: 01/12/2019 CLINICAL DATA:  Cough and shortness of  breath. EXAM: PORTABLE CHEST 1 VIEW COMPARISON:  Two-view chest x-ray 01/10/2018 FINDINGS: Heart size is. Patchy airspace opacities are present in lower lobes bilaterally. No edema or effusion. No focal airspace disease is present. IMPRESSION: 1. Patchy bibasilar airspace disease. This raises concern for infection. Electronically Signed   By: Marin Robertshristopher  Mattern M.D.   On: 01/12/2019 18:15    Procedures Procedures (including critical care time)  Medications Ordered in ED Medications  multivitamin with minerals tablet 1 tablet (has no administration in time range)  enoxaparin (LOVENOX) injection 40 mg (40 mg Subcutaneous Given 01/12/19 2258)  Ipratropium-Albuterol (COMBIVENT) respimat 1 puff (1 puff Inhalation Given 01/12/19 2300)  albuterol (VENTOLIN HFA) 108 (90 Base) MCG/ACT inhaler 4 puff (4 puffs Inhalation Given 01/12/19 2306)  guaiFENesin-dextromethorphan (ROBITUSSIN DM) 100-10 MG/5ML syrup 10 mL (10 mLs Oral Given 01/12/19 2300)  chlorpheniramine-HYDROcodone (TUSSIONEX) 10-8 MG/5ML suspension 5 mL (5 mLs Oral Given 01/12/19 2300)  methylPREDNISolone sodium succinate (SOLU-MEDROL) 40 mg/mL injection 40 mg (40 mg Intravenous Given 01/12/19 2302)  pneumococcal 23 valent vaccine (PNU-IMMUNE) injection 0.5 mL (has no administration in time range)  ondansetron (ZOFRAN) injection 4 mg (4 mg Intravenous Given 01/12/19 2302)  acetaminophen (TYLENOL) tablet 650 mg (650 mg Oral Given 01/12/19 2300)  albuterol (VENTOLIN HFA) 108 (90 Base) MCG/ACT inhaler 8 puff (8 puffs Inhalation Given 01/12/19 1717)  benzonatate (TESSALON) capsule 200 mg (200 mg Oral Given 01/12/19 1831)  sodium chloride 0.9 % bolus 500 mL (0 mLs Intravenous Stopped 01/12/19 1946)     Initial Impression / Assessment and Plan / ED Course  I have reviewed the triage vital signs and the nursing notes.  Pertinent labs & imaging results that were available during my care of the patient were reviewed by me and considered in my medical  decision making (see chart for details).        Felicia Ray is a 53 y.o. female with history of asthma and recent COVID-19 diagnosis presents to the ED complaining of cough, shortness of breath, and left-sided chest  pain.  Patient was placed on 3 L nasal cannula by EMS due to tachypnea to the 40s.  On arrival to the ED, patient is tachypneic, speaking in short sentences, and coughing frequently.  Patient was given a puffs of albuterol MDI with some improvement in her work of breathing.  X-ray shows patchy airspace opacities concerning for infectious process.  This is felt to be likely secondary to COVID-19 given patient's recent diagnosis.  Patient had borderline blood pressures of 80s over 60s for which she was given a 500 cc fluid bolus with improvement of her blood pressure.  The hospitalist was contacted for admission for further management of patient's COVID-19 infection and respiratory distress.  Patient will be admitted to the hospitalist.    Final Clinical Impressions(s) / ED Diagnoses   Final diagnoses:  Pneumonia due to COVID-19 virus  Acute respiratory distress    ED Discharge Orders    None       Garry HeaterEames, Zykira Matlack, MD 01/13/19 [id number]    Derwood KaplanNanavati, Ankit, MD 01/18/19 1527

## 2019-01-12 NOTE — ED Notes (Signed)
Attempted to call report

## 2019-01-12 NOTE — ED Triage Notes (Signed)
[facility] reports that the patient got a positive covid result on 01/06/19. She was febrile the first few days but is now afebrile. She started having a strong cough, chest pain and shortness of breath 2 days ago. Hx of asthma. Patient is currently coughing and is not able to speak many words.

## 2019-01-12 NOTE — ED Notes (Signed)
Patient urinated on herself while coughing. Changed the bed sheets and connected patient to a purwick.

## 2019-01-12 NOTE — H&P (Signed)
History and Physical    Leasia Swann KGY:[id number] DOB: 12-Jul-1965 DOA: 01/12/2019  PCP: Mack Hook, MD Patient coming from: Home  Chief Complaint: Cough, shortness of breath  HPI: [place] Charon Jesus Nemiah Commander is a 53 y.o. female with medical history significant of asthma and recent COVID-19 diagnosis  presenting to the hospital complaining of cough and shortness of breath.  Patient states she was diagnosed with coronavirus infection on July 20 but symptoms were mild at that time.  For the past 1 day she has had increasing shortness of breath.  She has a dry cough for the past few days.  She is having chest pain since yesterday which is substernal/left-sided, at rest, and associated with coughing.  Chest pain is not associated with nausea or diaphoresis.  Denies any vomiting or abdominal pain.  No other complaints.  ED Course: Afebrile, tachypneic, blood pressure soft, placed on 3 L supplemental oxygen via nasal cannula.  No leukocytosis.  Lactic acid 2.0 >1.6.  BUN 19, creatinine 0.9.  LFTs normal.  Procalcitonin <0.10.  Inflammatory markers including d-dimer, LDH, and ferritin normal.  Triglycerides elevated.  Fibrinogen elevated.  CRP pending.  Blood culture x2 pending.  Chest x-ray personally reviewed showing patchy airspace opacities in the lower lobes bilaterally. Patient received albuterol, Tessalon, and a 500 cc fluid bolus in the ED.  Review of Systems:  All systems reviewed and apart from history of presenting illness, are negative.  Past Medical History:  Diagnosis Date  . Asthma   . Chronic pain   . HLD (hyperlipidemia)     Past Surgical History:  Procedure Laterality Date  . TUBAL LIGATION       reports that she has never smoked. She has never used smokeless tobacco. She reports that she does not drink alcohol or use drugs.  Allergies  Allergen Reactions  . Aspirin Anaphylaxis    "Almost died after she was given this- felt like a heart attack"  .  Dipyrone Anaphylaxis and Hives    "Almost died after she was given this- felt like a heart attack" (also)    Family History  Problem Relation Age of Onset  . Hypertension Mother   . Asthma Mother   . Hyperlipidemia Mother   . Diabetes Sister   . Hypertension Sister   . Hyperlipidemia Sister   . Hypertension Brother     Prior to Admission medications   Medication Sig Start Date End Date Taking? Authorizing Provider  guaiFENesin (MUCINEX) 600 MG 12 hr tablet Take 600-1,200 mg by mouth every 12 (twelve) hours.   Yes [provider]  ibuprofen (ADVIL) 200 MG tablet Take 200-600 mg by mouth every 8 (eight) hours.   Yes [provider]  Multiple Vitamin (MULTIVITAMIN WITH MINERALS) TABS tablet Take 1 tablet by mouth daily with breakfast.   Yes [provider]    Physical Exam: Vitals:   01/12/19 2106 01/12/19 2115 01/12/19 2200 01/12/19 2223  BP:  106/80 107/84 117/65  Pulse:  84 87 86  Resp:  20 19 19   Temp:    (!) 97.3 F (36.3 C)  TempSrc:    Oral  SpO2:  95% 100% 100%  Weight: 78 kg   79.4 kg  Height: 5\' 1"  (1.549 m)   5\' 1"  (1.549 m)    Physical Exam  Constitutional: She is oriented to person, place, and time. She appears well-developed and well-nourished. She appears distressed.  HENT:  Head: Normocephalic.  Eyes: Right eye exhibits no discharge.  Left eye exhibits no discharge.  Neck: Neck supple.  Cardiovascular: Normal rate, regular rhythm and intact distal pulses.  Pulmonary/Chest: She is in respiratory distress. She has no wheezes. She has no rales.  Increased work of breathing Not able to speak clearly in full sentences  Abdominal: Soft. Bowel sounds are normal. She exhibits no distension. There is no abdominal tenderness. There is no guarding.  Musculoskeletal:        General: No edema.  Neurological: She is alert and oriented to person, place, and time.  Skin: Skin is warm. She is diaphoretic.     Labs on Admission: I have  personally reviewed following labs and imaging studies  CBC: Recent Labs  Lab 01/12/19 1645 01/12/19 2045  WBC 7.1  --   NEUTROABS 4.5  --   HGB 13.6 12.9  HCT 39.6 38.0  MCV 86.7  --   PLT 298  --    Basic Metabolic Panel: Recent Labs  Lab 01/12/19 1645 01/12/19 2045  NA 137 140  K 4.0 3.3*  CL 109  --   CO2 20*  --   GLUCOSE 106*  --   BUN 19  --   CREATININE 0.91  --   CALCIUM 9.1  --    GFR: Estimated Creatinine Clearance: 69.7 mL/min (by C-G formula based on SCr of 0.91 mg/dL). Liver Function Tests: Recent Labs  Lab 01/12/19 1645  AST 26  ALT 31  ALKPHOS 79  BILITOT 0.7  PROT 6.9  ALBUMIN 3.7   No results for input(s): LIPASE, AMYLASE in the last 168 hours. No results for input(s): AMMONIA in the last 168 hours. Coagulation Profile: No results for input(s): INR, PROTIME in the last 168 hours. Cardiac Enzymes: No results for input(s): CKTOTAL, CKMB, CKMBINDEX, TROPONINI in the last 168 hours. BNP (last 3 results) No results for input(s): PROBNP in the last 8760 hours. HbA1C: No results for input(s): HGBA1C in the last 72 hours. CBG: No results for input(s): GLUCAP in the last 168 hours. Lipid Profile: Recent Labs    01/12/19 1645  TRIG 745*   Thyroid Function Tests: No results for input(s): TSH, T4TOTAL, FREET4, T3FREE, THYROIDAB in the last 72 hours. Anemia Panel: Recent Labs    01/12/19 1645  FERRITIN 143   Urine analysis: No results found for: COLORURINE, APPEARANCEUR, LABSPEC, PHURINE, GLUCOSEU, HGBUR, BILIRUBINUR, KETONESUR, PROTEINUR, UROBILINOGEN, NITRITE, LEUKOCYTESUR  Radiological Exams on Admission: Dg Chest Port 1 View  Result Date: 01/12/2019 CLINICAL DATA:  Cough and shortness of breath. EXAM: PORTABLE CHEST 1 VIEW COMPARISON:  Two-view chest x-ray 01/10/2018 FINDINGS: Heart size is. Patchy airspace opacities are present in lower lobes bilaterally. No edema or effusion. No focal airspace disease is present. IMPRESSION: 1.  Patchy bibasilar airspace disease. This raises concern for infection. Electronically Signed   By: Marin Robertshristopher  Mattern M.D.   On: 01/12/2019 18:15    EKG: Independently reviewed.  Initial EKG with sinus rhythm, PVCs, nonspecific ST abnormalities.  No prior EKG for comparison. Repeat EKG with sinus rhythm, heart rate 74.  Assessment/Plan Principal Problem:   Pneumonia due to COVID-19 virus Active Problems:   Asthma   HLD (hyperlipidemia)   Pneumonia secondary to COVID-19 viral infection SARS-CoV-2 positive.  Tachypneic and had increased work of breathing, now improved.  Afebrile.  Blood pressure soft, now improved with a 500 cc fluid bolus.  Not hypoxic.  SPO2 100% on room air.  Placed on 3 L supplemental oxygen in the ED for comfort.  ABG with pH  7.44, PCO2 28, and PO2 182.  No leukocytosis. Lactic acid 2.0 >1.6.  LFTs normal.  Procalcitonin <0.10.  Inflammatory markers including d-dimer, LDH, and ferritin normal.  Triglycerides elevated.  Fibrinogen elevated.  CRP elevated at 3.2.  Chest x-ray personally reviewed showing patchy airspace opacities in the lower lobes bilaterally. -Solu-Medrol 1 mg/kg/day divided into 2 doses -Since patient is not hypoxic, SPO2 100% on room air, she currently does not meet criteria for Remdesivir.  Does not meet criteria for Actemra given CRP <7. Case has been discussed with [place] pharmacist and [facility]call [facility].   -[facility] as they do not have any beds available at this time.  As such, [facility] and [facility] if a bed becomes available. -Antitussives as needed -Check high-sensitivity troponin level -Daily CBC, CMP, LDH, d-dimer, ferritin -Airborne and contact precautions -Continuous pulse ox -Supplemental oxygen as needed -Blood culture x2 pending  Asthma -Stable.  No bronchospasm.  Work of breathing and tachypnea improved with albuterol inhaler.   -Combivent MDI every 6  hours and albuterol MDI as needed  History of hyperlipidemia Currently not on a statin.  Triglycerides 745. -Fasting lipid panel in a.m.  DVT prophylaxis: Lovenox Code Status: Full code Family Communication: No family available at this time Disposition Plan: Anticipate discharge after clinical improvement. Consults called: None Admission status: It is my clinical opinion that admission to INPATIENT is reasonable and necessary in this 53 y.o. female . presenting with increased work of breathing and tachypnea, COVID-19 positive, and chest x-ray with findings consistent with COVID-19 viral pneumonia.  Given the aforementioned, the predictability of an adverse outcome is felt to be significant. I expect that the patient will require at least 2 midnights in the hospital to treat this condition.   The medical decision making on this patient was of high complexity and the patient is at high risk for clinical deterioration, therefore this is a level 3 visit.  John GiovanniVasundhra Rhaelyn Giron MD [facility] Pager [phone]  If 7PM-7AM, please contact night-coverage [email or web address] Password [email or web address]  01/12/2019, 11:02 PM

## 2019-01-12 NOTE — Progress Notes (Signed)
Called ED to get report, no answer.

## 2019-01-13 LAB — COMPREHENSIVE METABOLIC PANEL
ALT: 30 U/L (ref 0–44)
AST: 24 U/L (ref 15–41)
Albumin: 3.6 g/dL (ref 3.5–5.0)
Alkaline Phosphatase: 70 U/L (ref 38–126)
Anion gap: 10 (ref 5–15)
BUN: 12 mg/dL (ref 6–20)
CO2: 21 mmol/L — ABNORMAL LOW (ref 22–32)
Calcium: 8.8 mg/dL — ABNORMAL LOW (ref 8.9–10.3)
Chloride: 108 mmol/L (ref 98–111)
Creatinine, Ser: 0.68 mg/dL (ref 0.44–1.00)
GFR calc Af Amer: >60 mL/min (ref >60)
GFR calc non Af Amer: >60 mL/min (ref >60)
Glucose, Bld: 155 mg/dL — ABNORMAL HIGH (ref 70–99)
Potassium: 4.1 mmol/L (ref 3.5–5.1)
Sodium: 139 mmol/L (ref 135–145)
Total Bilirubin: 0.6 mg/dL (ref 0.3–1.2)
Total Protein: 6.8 g/dL (ref 6.5–8.1)

## 2019-01-13 LAB — LIPID PANEL
Cholesterol: 192 mg/dL (ref 0–200)
HDL: 23 mg/dL — ABNORMAL LOW (ref >40)
LDL Cholesterol: UNDETERMINED mg/dL (ref 0–99)
Total CHOL/HDL Ratio: 8.3 RATIO
Triglycerides: 414 mg/dL — ABNORMAL HIGH (ref <150)
VLDL: UNDETERMINED mg/dL (ref 0–40)

## 2019-01-13 LAB — CBC
HCT: 39.2 % (ref 36.0–46.0)
Hemoglobin: 13.3 g/dL (ref 12.0–15.0)
MCH: 29.6 pg (ref 26.0–34.0)
MCHC: 33.9 g/dL (ref 30.0–36.0)
MCV: 87.3 fL (ref 80.0–100.0)
Platelets: 283 10*3/uL (ref 150–400)
RBC: 4.49 MIL/uL (ref 3.87–5.11)
RDW: 12.9 % (ref 11.5–15.5)
WBC: 5.2 10*3/uL (ref 4.0–10.5)
nRBC: 0 % (ref 0.0–0.2)

## 2019-01-13 LAB — LACTATE DEHYDROGENASE: LDH: 154 U/L (ref 98–192)

## 2019-01-13 LAB — LDL CHOLESTEROL, DIRECT: Direct LDL: 82.5 mg/dL (ref 0–99)

## 2019-01-13 LAB — MRSA PCR SCREENING: MRSA by PCR: NEGATIVE

## 2019-01-13 LAB — FERRITIN: Ferritin: 127 ng/mL (ref 11–307)

## 2019-01-13 LAB — HIV ANTIBODY (ROUTINE TESTING W REFLEX): HIV Screen 4th Generation wRfx: NONREACTIVE

## 2019-01-13 LAB — TROPONIN I (HIGH SENSITIVITY): Troponin I (High Sensitivity): <2 ng/L (ref <18)

## 2019-01-13 LAB — D-DIMER, QUANTITATIVE: D-Dimer, Quant: 0.45 ug/mL-FEU (ref 0.00–0.50)

## 2019-01-13 MED ORDER — SODIUM CHLORIDE 0.9 % IV SOLN
INTRAVENOUS | Status: DC
  Administered 2019-01-13 – 2019-01-14 (×2): via INTRAVENOUS

## 2019-01-13 MED ORDER — BENZONATATE 100 MG PO CAPS
200.0000 mg | ORAL_CAPSULE | Freq: Three times a day (TID) | ORAL | Status: DC
  Administered 2019-01-13 – 2019-01-15 (×6): 200 mg via ORAL
  Filled 2019-01-13 (×6): qty 2

## 2019-01-13 NOTE — Progress Notes (Signed)
  PROGRESS NOTE  Felicia Ray VQM:[id number] DOB: 1965-11-10 DOA: 01/12/2019 PCP: Mack Hook, MD Note that patient has 2 charts Felicia Ray, Felicia Ray - [id number]  Brief History   53 year old female non-smoker  known asthma, vertigo, possible sleep apnea diagnosed with COVID 01/02/2019 Came to emergency room 7/30 worsening cough chest pain-initially needed 3 L by nasal cannula and was tachypneic to the 40s given albuterol also blood pressures were low 80s over 60s CXR patchy infiltrates lactic acid 2.0 procalcitonin less than 0.1 A & P  Coronavirus 19 infection No oxygen requirement biomarkers ar not elevated-repeating a.m. Expectant management supportive care Continuing Solu-Medrol 40 every 12 Have explained to her clearly she needs to sit up at all times when awake she understands Asthma Worsened likely secondary to above-continue Tussionex every 12 Tessalon 200 added 3 times daily continue albuterol every 2 as needed, Combivent every 6 [facility] for OSA Outpatient testing      DVT prophylaxis: Lovenox Code Status: Full Family Communication: None--- I discussed the plan of care with her and the interpreter and gave a prescription to the interpreter to discuss with her husband Disposition Plan: Likely can discharge in 24 to 48 hours if remains without oxygen requirement   Verneita Griffes, MD [facility] 12:01 PM  01/13/2019, 12:01 PM  LOS: 1 day   Consultants  .   Procedures  .   Antibiotics  .   Interval History/Subjective  Awake coherent no distress main complaint is severe cough although it seems somewhat exuberant and out of proportion-  Objective   Vitals:  Vitals:   01/13/19 0621 01/13/19 0800  BP:  93/64  Pulse: 78 73  Resp: (!) 22 (!) 26  Temp:    SpO2: 97% 100%    Exam:  Obese pleasant some cough Thick neck Mallampati 2 Chest clear No wheeze S1-S2 no murmur rub or gallop Abdomen soft no rebound no guarding No lower  extremity edema   I have personally reviewed the following:   Today's Data  .   Lab Data  . D-dimer 0.4 ferritin 127 LDH 154 . BUN/creatinine down from 19/0.9 to 12/0.6 CO2 21  Micro Data  .   Imaging  .   Cardiology Data  .   Other Data  .   Scheduled Meds: . enoxaparin (LOVENOX) injection  40 mg Subcutaneous Q24H  . Ipratropium-Albuterol  1 puff Inhalation Q6H  . methylPREDNISolone (SOLU-MEDROL) injection  40 mg Intravenous Q12H  . multivitamin with minerals  1 tablet Oral Q breakfast  . pneumococcal 23 valent vaccine  0.5 mL Intramuscular Tomorrow-1000   Continuous Infusions:  Principal Problem:   Pneumonia due to COVID-19 virus Active Problems:   Asthma   HLD (hyperlipidemia)   LOS: 1 day   How to contact the [facility] Attending or Consulting provider 7A - 7P or covering provider during after hours [place], for this patient?  1. Check the care team in [facility] and look for a) attending/consulting TR[facility] provider listed and b) the [facility] team listed 2. Log into [email or web address] and use [place]'s universal password to access. If you do not have the password, please contact the hospital operator. 3. Locate the [facility] provider you are looking for under [facility] and page to a number that you can be directly reached. 4. If you still have difficulty reaching the provider, please page the [facility] (Director on Call) for the Hospitalists listed on amion for assistance.

## 2019-01-14 LAB — CBC
HCT: 38.1 % (ref 36.0–46.0)
Hemoglobin: 12.9 g/dL (ref 12.0–15.0)
MCH: 29.7 pg (ref 26.0–34.0)
MCHC: 33.9 g/dL (ref 30.0–36.0)
MCV: 87.6 fL (ref 80.0–100.0)
Platelets: 316 10*3/uL (ref 150–400)
RBC: 4.35 MIL/uL (ref 3.87–5.11)
RDW: 13.1 % (ref 11.5–15.5)
WBC: 13.8 10*3/uL — ABNORMAL HIGH (ref 4.0–10.5)
nRBC: 0 % (ref 0.0–0.2)

## 2019-01-14 LAB — FERRITIN: Ferritin: 138 ng/mL (ref 11–307)

## 2019-01-14 LAB — COMPREHENSIVE METABOLIC PANEL
ALT: 34 U/L (ref 0–44)
AST: 25 U/L (ref 15–41)
Albumin: 3.5 g/dL (ref 3.5–5.0)
Alkaline Phosphatase: 67 U/L (ref 38–126)
Anion gap: 9 (ref 5–15)
BUN: 10 mg/dL (ref 6–20)
CO2: 22 mmol/L (ref 22–32)
Calcium: 9 mg/dL (ref 8.9–10.3)
Chloride: 109 mmol/L (ref 98–111)
Creatinine, Ser: 0.68 mg/dL (ref 0.44–1.00)
GFR calc Af Amer: >60 mL/min (ref >60)
GFR calc non Af Amer: >60 mL/min (ref >60)
Glucose, Bld: 128 mg/dL — ABNORMAL HIGH (ref 70–99)
Potassium: 4.1 mmol/L (ref 3.5–5.1)
Sodium: 140 mmol/L (ref 135–145)
Total Bilirubin: 0.5 mg/dL (ref 0.3–1.2)
Total Protein: 6.6 g/dL (ref 6.5–8.1)

## 2019-01-14 LAB — D-DIMER, QUANTITATIVE: D-Dimer, Quant: 0.27 ug/mL-FEU (ref 0.00–0.50)

## 2019-01-14 LAB — C-REACTIVE PROTEIN: CRP: <0.8 mg/dL (ref <1.0)

## 2019-01-14 MED ORDER — MONTELUKAST SODIUM 10 MG PO TABS
10.0000 mg | ORAL_TABLET | Freq: Every day | ORAL | Status: DC
  Administered 2019-01-14: 10 mg via ORAL
  Filled 2019-01-14: qty 1

## 2019-01-14 MED ORDER — PREDNISONE 20 MG PO TABS
60.0000 mg | ORAL_TABLET | Freq: Every day | ORAL | Status: DC
  Administered 2019-01-14 – 2019-01-15 (×2): 60 mg via ORAL
  Filled 2019-01-14 (×2): qty 3

## 2019-01-14 NOTE — Progress Notes (Signed)
  PROGRESS NOTE  [place] Charon Antioch Ferra-Zoma BMW:[id number] DOB: 06/30/1965 DOA: 01/12/2019 PCP: Mack Hook, MD Note that patient has 2 charts Felicia Ray, Felicia Ray - [id number]  Brief History   53 year old female non-smoker  known asthma, vertigo, possible sleep apnea diagnosed with COVID 01/02/2019 Came to emergency room 7/30 worsening cough chest pain-initially needed 3 L by nasal cannula and was tachypneic to the 40s given albuterol also blood pressures were low 80s over 60s CXR patchy infiltrates lactic acid 2.0 procalcitonin less than 0.1 A & P  Coronavirus 19 infection No oxygen requirement biomarkers ar not elevated-repeating a.m. Improved Still coughing Change Solu-Medrol to prednisone 60 Have advised that probably will be going home a.m. and to plan for the same if she looks this good Asthma Worsened likely secondary to above-continue Tussionex every 12 Tessalon 200 added 3 times daily continue albuterol every 2 as needed, Combivent every 6 add montelukast although does have black box warning [facility] for OSA Outpatient testing     DVT prophylaxis: Lovenox Code Status: Full Family Communication: None Disposition Plan: Likely discharge a.m.   Verneita Griffes, MD [facility] 10:00 AM  01/14/2019, 10:00 AM  LOS: 2 days   Consultants  .   Procedures  .   Antibiotics  .   Interval History/Subjective  Awake coherent feels much better in some ways although still complaining of cough, sense of smell is intermittent only She ate a full breakfast She slept through most of the morning She does not seem to be in distress O2 sats almost 100%  Objective   Vitals:  Vitals:   01/14/19 0300 01/14/19 0852  BP: 104/64 (!) 89/61  Pulse: 75 69  Resp: (!) 24 (!) 22  Temp: 98.2 F (36.8 C)   SpO2: 93% 97%    Exam:  Coherent pleasant obese Thick neck Chest clear with no wheeze rales rhonchi Abdomen soft nontender No lower extremity edema  Neurologically intact moves all 4 limbs equally    I have personally reviewed the following:   Today's Data  .   Lab Data  . D-dimer 0.4 ferritin 127 LDH 154---> i .  ferritin of 138, CRP now less than 0.8, dimer less than 0.27 . BUN/creatinine down from 19/0.9 to 12/0.6 C-->10/0.6 . cO2 21  .   Scheduled Meds: . benzonatate  200 mg Oral TID  . enoxaparin (LOVENOX) injection  40 mg Subcutaneous Q24H  . Ipratropium-Albuterol  1 puff Inhalation Q6H  . multivitamin with minerals  1 tablet Oral Q breakfast  . predniSONE  60 mg Oral QAC breakfast   Continuous Infusions:  Principal Problem:   Pneumonia due to COVID-19 virus Active Problems:   Asthma   HLD (hyperlipidemia)   LOS: 2 days   How to contact the [facility] Attending or Consulting provider [place] or covering provider during after hours [place], for this patient?  1. Check the care team in [facility] and look for a) attending/consulting TR[facility] provider listed and b) the [facility] team listed 2. Log into [email or web address] and use [place]'s universal password to access. If you do not have the password, please contact the hospital operator. 3. Locate the [facility] provider you are looking for under [facility] and page to a number that you can be directly reached. 4. If you still have difficulty reaching the provider, please page the [facility] (Director on Call) for the Hospitalists listed on amion for assistance.

## 2019-01-14 NOTE — Progress Notes (Signed)
Pt complained of left leg being asleep. No redness, swelling or pain. Pt does have sensation in leg and pedal pulses are strong.  Notified Dr Verlon Au. Will ambulate patient and continue to monitor. .   Pt's output today has been 3590ml. Dr Perlie Gold notified. Urine is clear, and odorless. Will continue to monitor.

## 2019-01-15 ENCOUNTER — Encounter: Payer: Self-pay | Admitting: Internal Medicine

## 2019-01-15 DIAGNOSIS — U071 COVID-19: Principal | ICD-10-CM

## 2019-01-15 DIAGNOSIS — J1289 Other viral pneumonia: Secondary | ICD-10-CM

## 2019-01-15 LAB — COMPREHENSIVE METABOLIC PANEL WITH GFR
ALT: 52 U/L — ABNORMAL HIGH (ref 0–44)
AST: 29 U/L (ref 15–41)
Albumin: 3.5 g/dL (ref 3.5–5.0)
Alkaline Phosphatase: 63 U/L (ref 38–126)
Anion gap: 9 (ref 5–15)
BUN: 12 mg/dL (ref 6–20)
CO2: 24 mmol/L (ref 22–32)
Calcium: 9 mg/dL (ref 8.9–10.3)
Chloride: 106 mmol/L (ref 98–111)
Creatinine, Ser: 0.73 mg/dL (ref 0.44–1.00)
GFR calc Af Amer: >60 mL/min
GFR calc non Af Amer: >60 mL/min
Glucose, Bld: 98 mg/dL (ref 70–99)
Potassium: 3.4 mmol/L — ABNORMAL LOW (ref 3.5–5.1)
Sodium: 139 mmol/L (ref 135–145)
Total Bilirubin: 0.5 mg/dL (ref 0.3–1.2)
Total Protein: 6.4 g/dL — ABNORMAL LOW (ref 6.5–8.1)

## 2019-01-15 LAB — CBC
HCT: 38.3 % (ref 36.0–46.0)
Hemoglobin: 13 g/dL (ref 12.0–15.0)
MCH: 29.9 pg (ref 26.0–34.0)
MCHC: 33.9 g/dL (ref 30.0–36.0)
MCV: 88 fL (ref 80.0–100.0)
Platelets: 301 10*3/uL (ref 150–400)
RBC: 4.35 MIL/uL (ref 3.87–5.11)
RDW: 13.2 % (ref 11.5–15.5)
WBC: 7.9 10*3/uL (ref 4.0–10.5)
nRBC: 0 % (ref 0.0–0.2)

## 2019-01-15 LAB — D-DIMER, QUANTITATIVE: D-Dimer, Quant: 0.42 ug/mL-FEU (ref 0.00–0.50)

## 2019-01-15 LAB — C-REACTIVE PROTEIN: CRP: <0.8 mg/dL

## 2019-01-15 LAB — FERRITIN: Ferritin: 158 ng/mL (ref 11–307)

## 2019-01-15 MED ORDER — IPRATROPIUM-ALBUTEROL 20-100 MCG/ACT IN AERS: 1.0000 | INHALATION_SPRAY | Freq: Four times a day (QID) | RESPIRATORY_TRACT | 0 refills | Status: DC

## 2019-01-15 MED ORDER — GUAIFENESIN-DM 100-10 MG/5ML PO SYRP: 10.0000 mL | ORAL_SOLUTION | ORAL | 0 refills | Status: DC | PRN

## 2019-01-15 MED ORDER — HYDROCOD POLST-CPM POLST ER 10-8 MG/5ML PO SUER: 5.0000 mL | Freq: Two times a day (BID) | ORAL | 0 refills | Status: DC | PRN

## 2019-01-15 MED ORDER — MONTELUKAST SODIUM 10 MG PO TABS: 10.0000 mg | ORAL_TABLET | Freq: Every day | ORAL | 0 refills | Status: DC

## 2019-01-15 MED ORDER — PREDNISONE 20 MG PO TABS: ORAL_TABLET | ORAL | 0 refills | Status: AC

## 2019-01-15 MED ORDER — ACETAMINOPHEN 325 MG PO TABS: 650.0000 mg | ORAL_TABLET | Freq: Four times a day (QID) | ORAL | Status: DC | PRN

## 2019-01-15 MED ORDER — BENZONATATE 200 MG PO CAPS: 200.0000 mg | ORAL_CAPSULE | Freq: Three times a day (TID) | ORAL | 0 refills | Status: DC

## 2019-01-15 MED ORDER — ALBUTEROL SULFATE HFA 108 (90 BASE) MCG/ACT IN AERS: 4.0000 | INHALATION_SPRAY | RESPIRATORY_TRACT | 0 refills | Status: DC | PRN

## 2019-01-15 NOTE — Care Management (Signed)
Patient entered in [facility] system. Letter provided to UC to give to RN who is unavailable in [place] room. No other CM needs identified.

## 2019-01-15 NOTE — Discharge Summary (Signed)
Physician Discharge Summary  Felicia Ray PIR:[id number] DOB: July 08, 1965 DOA: 01/12/2019  PCP: Mack Hook, MD  Admit date: 01/12/2019 Discharge date: 01/15/2019  Time spent: 20 minutes  Recommendations for Outpatient Follow-up:  1. Coronavirus specific precautions given to the patient patient encouraged to use my chart and download app in addition to self-isolation as below 2. Steroid taper given and called into her pharmacy 3. Strict return precautions emphasized in Spanish with interpreter  Discharge Diagnoses:  Principal Problem:   Pneumonia due to COVID-19 virus Active Problems:   Asthma   HLD (hyperlipidemia)   Discharge Condition: Improved  Diet recommendation: Heart healthy  Filed Weights   01/12/19 2106 01/12/19 2223  Weight: 78 kg 79.4 kg    History of present illness:  53 year old female non-smoker  known asthma, vertigo, possible sleep apnea diagnosed with COVID 01/02/2019 Came to emergency room 7/30 worsening cough chest pain-initially needed 3 L by nasal cannula and was tachypneic to the 40s given albuterol also blood pressures were low 80s over 60s CXR patchy infiltrates lactic acid 2.0 procalcitonin less than 0.1  She improved pretty well although had headaches close to discharge she was asymptomatic and not requiring oxygen  Hospital Course:  Coronavirus 19 infection Later in the hospitalization she had absolutely no oxygen requirement biomarkers ar not elevated-she did not meet criteria for any advanced therapies such as remdesivir I advised her that she will probably feel poorly for the next month-take frequent rest Continuing Solu-Medrol 40 every 12 switch to a long taper of steroids orally on discharge Prone positioning at home-COVID precautions given as below Asthma Refilled all asthma medications in addition adding Combivent Steroids as above [facility] for OSA Outpatient testing  Consultations:  None  Discharge Exam: Vitals:    01/15/19 0300 01/15/19 0810  BP: 94/66 105/69  Pulse: 68 74  Resp: (!) 24 17  Temp: 98 F (36.7 C) 98.1 F (36.7 C)  SpO2: 97% 97%    General: Awake alert coherent no distress Cardiovascular: S1-S2 no murmur rub or gallop Respiratory: No wheeze no rhonchi although some coughing in the room Abdomen soft nontender no rebound no guarding No lower extremity edema  Discharge Instructions   Discharge Instructions    Diet - low sodium heart healthy   Complete by: As directed    Discharge instructions   Complete by: As directed    Make sure that you take all of your medications as directed specifically the steroids you can take over-the-counter high-dose vitamin C and get plenty of sunshine We have given you instructions in detail about infection prevention, transmission and self quarantine for next 2 weeks. Also instructed on returning to the hospital if she has worsening shortness of breath, cough, chest pain, fevers, chills, nausea, vomiting, abdominal pain . Stay away from other people and always wear a mask   Increase activity slowly   Complete by: As directed    MyChart COVID-19 home monitoring program   Complete by: Jan 15, 2019    Is the patient willing to use the [place] for home monitoring?: Yes   Temperature monitoring   Complete by: Jan 15, 2019    After how many days would you like to receive a notification of this patient's flowsheet entries?: 1     Allergies as of 01/15/2019      Reactions   Aspirin Anaphylaxis   "Almost died after she was given this- felt like a heart attack"   Dipyrone Anaphylaxis, Hives   "Almost  died after she was given this- felt like a heart attack" (also)      Medication List    STOP taking these medications   guaiFENesin 600 MG 12 hr tablet Commonly known as: MUCINEX   ibuprofen 200 MG tablet Commonly known as: ADVIL     TAKE these medications   acetaminophen 325 MG tablet Commonly known as: TYLENOL Take 2 tablets  (650 mg total) by mouth every 6 (six) hours as needed for mild pain, fever or headache.   albuterol 108 (90 Base) MCG/ACT inhaler Commonly known as: VENTOLIN HFA Inhale 4 puffs into the lungs every 2 (two) hours as needed for wheezing or shortness of breath.   benzonatate 200 MG capsule Commonly known as: TESSALON Take 1 capsule (200 mg total) by mouth 3 (three) times daily.   chlorpheniramine-HYDROcodone 10-8 MG/5ML Suer Commonly known as: TUSSIONEX Take 5 mLs by mouth every 12 (twelve) hours as needed for cough.   guaiFENesin-dextromethorphan 100-10 MG/5ML syrup Commonly known as: ROBITUSSIN DM Take 10 mLs by mouth every 4 (four) hours as needed for cough.   Ipratropium-Albuterol 20-100 MCG/ACT Aers respimat Commonly known as: COMBIVENT Inhale 1 puff into the lungs every 6 (six) hours.   montelukast 10 MG tablet Commonly known as: SINGULAIR Take 1 tablet (10 mg total) by mouth at bedtime.   multivitamin with minerals Tabs tablet Take 1 tablet by mouth daily with breakfast.   predniSONE 20 MG tablet Commonly known as: DELTASONE Take 3 tablets (60 mg total) by mouth daily before breakfast for 4 days, THEN 2 tablets (40 mg total) daily before breakfast for 4 days, THEN 1 tablet (20 mg total) daily before breakfast for 4 days. Start taking on: January 15, 2019      Allergies  Allergen Reactions  . Aspirin Anaphylaxis    "Almost died after she was given this- felt like a heart attack"  . Dipyrone Anaphylaxis and Hives    "Almost died after she was given this- felt like a heart attack" (also)      The results of significant diagnostics from this hospitalization (including imaging, microbiology, ancillary and laboratory) are listed below for reference.    Significant Diagnostic Studies: Dg Chest Port 1 View  Result Date: 01/12/2019 CLINICAL DATA:  Cough and shortness of breath. EXAM: PORTABLE CHEST 1 VIEW COMPARISON:  Two-view chest x-ray 01/10/2018 FINDINGS: Heart size  is. Patchy airspace opacities are present in lower lobes bilaterally. No edema or effusion. No focal airspace disease is present. IMPRESSION: 1. Patchy bibasilar airspace disease. This raises concern for infection. Electronically Signed   By: Marin Robertshristopher  Mattern M.D.   On: 01/12/2019 18:15    Microbiology: Recent Results (from the past 240 hour(s))  Blood Culture (routine x 2)     Status: None (Preliminary result)   Collection Time: 01/12/19  5:32 PM   Specimen: BLOOD  Result Value Ref Range Status   Specimen Description BLOOD LEFT ANTECUBITAL  Final   Special Requests   Final    BOTTLES DRAWN AEROBIC AND ANAEROBIC Blood Culture adequate volume   Culture   Final    NO GROWTH 3 DAYS Performed at [facility], [address] [place], [place] [address]    Report Status PENDING  Incomplete  Blood Culture (routine x 2)     Status: None (Preliminary result)   Collection Time: 01/12/19  6:00 PM   Specimen: BLOOD RIGHT ARM  Result Value Ref Range Status   Specimen Description BLOOD RIGHT ARM  Final   Special Requests   Final    BOTTLES DRAWN AEROBIC AND ANAEROBIC Blood Culture results may not be optimal due to an excessive volume of blood received in culture bottles   Culture   Final    NO GROWTH 3 DAYS Performed at [facility], [address] [place], [place] [address]    Report Status PENDING  Incomplete  SARS Coronavirus 2 (CEPHEID - Performed in [facility] hospital lab), Hosp Order     Status: Abnormal   Collection Time: 01/12/19  7:54 PM   Specimen: Nasopharyngeal Swab  Result Value Ref Range Status   SARS Coronavirus 2 POSITIVE (A) NEGATIVE Final    Comment: RESULT CALLED TO, READ BACK BY AND VERIFIED WITH: D. Luiz OchoaWILLIAMS,RN 2134 01/12/2019 T. TYSOR (NOTE) If result is NEGATIVE SARS-CoV-2 target nucleic acids are NOT DETECTED. The SARS-CoV-2 RNA is generally detectable in upper and lower  respiratory specimens during the acute phase of infection. The lowest   concentration of SARS-CoV-2 viral copies this assay can detect is 250  copies / mL. A negative result does not preclude SARS-CoV-2 infection  and should not be used as the sole basis for treatment or other  patient management decisions.  A negative result may occur with  improper specimen collection / handling, submission of specimen other  than nasopharyngeal swab, presence of viral mutation(s) within the  areas targeted by this assay, and inadequate number of viral copies  (<250 copies / mL). A negative result must be combined with clinical  observations, patient history, and epidemiological information. If result is POSITIVE SARS-CoV-2 target nucleic acids are DETECTED . The SARS-CoV-2 RNA is generally detectable in upper and lower  respiratory specimens during the acute phase of infection.  Positive  results are indicative of active infection with SARS-CoV-2.  Clinical  correlation with patient history and other diagnostic information is  necessary to determine patient infection status.  Positive results do  not rule out bacterial infection or co-infection with other viruses. If result is PRESUMPTIVE POSTIVE SARS-CoV-2 nucleic acids MAY BE PRESENT.   A presumptive positive result was obtained on the submitted specimen  and confirmed on repeat testing.  While 2019 novel coronavirus  (SARS-CoV-2) nucleic acids may be present in the submitted sample  additional confirmatory testing may be necessary for epidemiological  and / or clinical management purposes  to differentiate between  SARS-CoV-2 and other Sarbecovirus currently known to infect humans.  If clinically indicated additional testing with an alternate test  methodology [phone] ) is advised. The SARS-CoV-2 RNA is generally  detectable in upper and lower respiratory specimens during the acute  phase of infection. The expected result is Negative. Fact Sheet for Patients:  BoilerBrush.com.cyhttps://www.fda.gov/media/136312/download Fact Sheet  for Healthcare Providers: https://pope.com/https://www.fda.gov/media/136313/download This test is not yet approved or cleared by the [place] FDA and has been authorized for detection and/or diagnosis of SARS-CoV-2 by FDA under an Emergency Use Authorization (EUA).  This EUA will remain in effect (meaning this test can be used) for the duration of the COVID-19 declaration under Section 564(b)(1) of the Act, 21 U.S.C. section 360bbb-3(b)(1), unless the authorization is terminated or revoked sooner. Performed at [facility], [address] [place], [place] [address]   MRSA PCR Screening     Status: None   Collection Time: 01/12/19 10:27 PM   Specimen: Nasopharyngeal  Result Value Ref Range Status   MRSA by PCR NEGATIVE NEGATIVE Final    Comment:  The GeneXpert MRSA Assay (FDA approved for NASAL specimens only), is one component of a comprehensive MRSA colonization surveillance program. It is not intended to diagnose MRSA infection nor to guide or monitor treatment for MRSA infections. Performed at [facility], [address] [place], [place] [address]      Labs: Basic Metabolic Panel: Recent Labs  Lab 01/12/19 1645 01/12/19 2045 01/13/19 0334 01/14/19 0543 01/15/19 0731  NA 137 140 139 140 139  K 4.0 3.3* 4.1 4.1 3.4*  CL 109  --  108 109 106  CO2 20*  --  21* 22 24  GLUCOSE 106*  --  155* 128* 98  BUN 19  --  12 10 12   CREATININE 0.91  --  0.68 0.68 0.73  CALCIUM 9.1  --  8.8* 9.0 9.0   Liver Function Tests: Recent Labs  Lab 01/12/19 1645 01/13/19 0334 01/14/19 0543 01/15/19 0731  AST 26 24 25 29   ALT 31 30 34 52*  ALKPHOS 79 70 67 63  BILITOT 0.7 0.6 0.5 0.5  PROT 6.9 6.8 6.6 6.4*  ALBUMIN 3.7 3.6 3.5 3.5   No results for input(s): LIPASE, AMYLASE in the last 168 hours. No results for input(s): AMMONIA in the last 168 hours. CBC: Recent Labs  Lab 01/12/19 1645 01/12/19 2045 01/13/19 0334 01/14/19 0543 01/15/19 0731  WBC 7.1  --  5.2  13.8* 7.9  NEUTROABS 4.5  --   --   --   --   HGB 13.6 12.9 13.3 12.9 13.0  HCT 39.6 38.0 39.2 38.1 38.3  MCV 86.7  --  87.3 87.6 88.0  PLT 298  --  283 316 301   Cardiac Enzymes: No results for input(s): CKTOTAL, CKMB, CKMBINDEX, TROPONINI in the last 168 hours. BNP: BNP (last 3 results) No results for input(s): BNP in the last 8760 hours.  ProBNP (last 3 results) No results for input(s): PROBNP in the last 8760 hours.  CBG: No results for input(s): GLUCAP in the last 168 hours.     Signed:  Rhetta MuraJai-Gurmukh Gillis Boardley MD   [facility] 01/15/2019, 10:34 AM

## 2019-01-17 LAB — CULTURE, BLOOD (ROUTINE X 2)
Culture: NO GROWTH
Culture: NO GROWTH
Special Requests: ADEQUATE

## 2019-01-19 ENCOUNTER — Encounter (INDEPENDENT_AMBULATORY_CARE_PROVIDER_SITE_OTHER): Payer: Self-pay

## 2019-01-20 ENCOUNTER — Encounter (INDEPENDENT_AMBULATORY_CARE_PROVIDER_SITE_OTHER): Payer: Self-pay

## 2019-01-21 ENCOUNTER — Encounter (INDEPENDENT_AMBULATORY_CARE_PROVIDER_SITE_OTHER): Payer: Self-pay

## 2019-01-22 ENCOUNTER — Encounter (INDEPENDENT_AMBULATORY_CARE_PROVIDER_SITE_OTHER): Payer: Self-pay

## 2019-01-23 ENCOUNTER — Encounter (INDEPENDENT_AMBULATORY_CARE_PROVIDER_SITE_OTHER): Payer: Self-pay

## 2019-01-24 ENCOUNTER — Encounter (INDEPENDENT_AMBULATORY_CARE_PROVIDER_SITE_OTHER): Payer: Self-pay

## 2019-01-25 ENCOUNTER — Encounter (INDEPENDENT_AMBULATORY_CARE_PROVIDER_SITE_OTHER): Payer: Self-pay

## 2019-01-26 ENCOUNTER — Encounter (INDEPENDENT_AMBULATORY_CARE_PROVIDER_SITE_OTHER): Payer: Self-pay

## 2019-01-27 ENCOUNTER — Other Ambulatory Visit: Payer: Self-pay

## 2019-01-27 ENCOUNTER — Encounter (INDEPENDENT_AMBULATORY_CARE_PROVIDER_SITE_OTHER): Payer: Self-pay

## 2019-01-27 DIAGNOSIS — Z20822 Contact with and (suspected) exposure to covid-19: Secondary | ICD-10-CM

## 2019-01-28 ENCOUNTER — Encounter (INDEPENDENT_AMBULATORY_CARE_PROVIDER_SITE_OTHER): Payer: Self-pay

## 2019-01-29 LAB — NOVEL CORONAVIRUS, NAA: SARS-CoV-2, NAA: NOT DETECTED

## 2019-02-01 ENCOUNTER — Telehealth: Payer: Self-pay | Admitting: Internal Medicine

## 2019-02-01 NOTE — Telephone Encounter (Signed)
Patient called to let us know after being + with COVID19 got retested last week and received - COVID19 results back today.

## 2019-02-06 ENCOUNTER — Other Ambulatory Visit (INDEPENDENT_AMBULATORY_CARE_PROVIDER_SITE_OTHER): Payer: Self-pay

## 2019-02-06 ENCOUNTER — Other Ambulatory Visit: Payer: Self-pay

## 2019-02-06 DIAGNOSIS — E782 Mixed hyperlipidemia: Secondary | ICD-10-CM

## 2019-02-07 LAB — LIPID PANEL W/O CHOL/HDL RATIO
Cholesterol, Total: 265 mg/dL — ABNORMAL HIGH (ref 100–199)
HDL: 40 mg/dL (ref >39)
LDL Calculated: 159 mg/dL — ABNORMAL HIGH (ref 0–99)
Triglycerides: 328 mg/dL — ABNORMAL HIGH (ref 0–149)
VLDL Cholesterol Cal: 66 mg/dL — ABNORMAL HIGH (ref 5–40)

## 2019-02-09 ENCOUNTER — Other Ambulatory Visit: Payer: Self-pay

## 2019-02-09 ENCOUNTER — Encounter: Payer: Self-pay | Admitting: Internal Medicine

## 2019-02-09 ENCOUNTER — Ambulatory Visit: Payer: Self-pay | Admitting: Internal Medicine

## 2019-02-09 VITALS — BP 102/62 | HR 82 | Resp 12 | Ht 59.25 in | Wt 167.0 lb

## 2019-02-09 DIAGNOSIS — Z8619 Personal history of other infectious and parasitic diseases: Secondary | ICD-10-CM

## 2019-02-09 DIAGNOSIS — Z6835 Body mass index (BMI) 35.0-35.9, adult: Secondary | ICD-10-CM

## 2019-02-09 DIAGNOSIS — Z8616 Personal history of COVID-19: Secondary | ICD-10-CM

## 2019-02-09 DIAGNOSIS — E782 Mixed hyperlipidemia: Secondary | ICD-10-CM

## 2019-02-09 NOTE — Patient Instructions (Signed)
One [facility]:  [phone] [place]

## 2019-02-09 NOTE — Progress Notes (Signed)
    Subjective:    Patient ID: Felicia Ray, female   DOB: [birth year], 53 y.o.   MRN: [id number]   HPI   Daughter interprets  1.  Hx of COVID19 for which she was hospitalized 01/12/2019 until 01/15/2019.  Did not require ICU status.  States about 1 week after discharge, she felt significantly better. She just started walking again with her daughter (who runs)  2.  Obesity:  She has no idea what her weight was before she was ill with COVID.  Down 10 lbs from her visit in June.  3.  Hyperlipidemia:  Was hospitalized in July and [place] card expired.  Was unable to get her Atorvastatin without [place] card.    Current Meds  Medication Sig  . Cholecalciferol (VITAMIN D3) 50 MCG (2000 UT) TABS Take by mouth. 1 daily  . EPINEPHrine (EPIPEN) 0.3 mg/0.3 mL DEVI Inject 0.3 mLs (0.3 mg total) into the muscle as needed.  . montelukast (SINGULAIR) 10 MG tablet Take 1 tablet (10 mg total) by mouth at bedtime.  . Multiple Vitamin (MULTIVITAMIN WITH MINERALS) TABS tablet Take 1 tablet by mouth daily with breakfast.  . vitamin E (VITAMIN E) 400 UNIT capsule Take 400 Units by mouth daily.   Allergies  Allergen Reactions  . Aspirin Anaphylaxis    "Almost died after she was given this- felt like a heart attack"  . Dipyrone Anaphylaxis and Hives    "Almost died after she was given this- felt like a heart attack" (also)  . Aspirin Other (See Comments)    Cant breath  . Dipyrone Hives  . Other     metamizole - blindness, n/v (an international drug)     Review of Systems    Objective:   BP 102/62 (BP Location: Left Arm, Patient Position: Sitting, Cuff Size: Normal)   Pulse 82   Resp 12   Ht 4' 11.25" (1.505 m)   Wt 167 lb (75.8 kg)   LMP  (LMP Unknown)   BMI 33.45 kg/m   Physical Exam  NAD Looks well. Lungs:  CTA CV:  RRR without murmur or rub.  Radial pulses normal and equal. LE:  No edema.   Assessment & Plan   1.  Hyperlipidemia:  Was not taking Atorvastatin as  noted in history.  Will call [facility] and let them know what was going on.  Hopefully, she can still get the medication filled there.  If not, will send to [facility] in the morning.  2.  Obesity:  Continues to lose weight, suspect much of 10 lb loss was with her COVID19 illness, but back at lifestyle changes.   3.  COVID19:  Discussed could donate convalescent plasma at [facility] and [place].  Gave her the contact info for the latter, as did not have.

## 2019-02-22 ENCOUNTER — Other Ambulatory Visit: Payer: Self-pay

## 2019-02-22 DIAGNOSIS — Z20822 Contact with and (suspected) exposure to covid-19: Secondary | ICD-10-CM

## 2019-02-23 LAB — NOVEL CORONAVIRUS, NAA: SARS-CoV-2, NAA: NOT DETECTED

## 2019-04-06 ENCOUNTER — Other Ambulatory Visit: Payer: Self-pay

## 2019-04-06 DIAGNOSIS — E782 Mixed hyperlipidemia: Secondary | ICD-10-CM

## 2019-04-06 DIAGNOSIS — Z79899 Other long term (current) drug therapy: Secondary | ICD-10-CM

## 2019-04-07 LAB — LIPID PANEL W/O CHOL/HDL RATIO
Cholesterol, Total: 146 mg/dL (ref 100–199)
HDL: 37 mg/dL — ABNORMAL LOW (ref >39)
LDL Chol Calc (NIH): 83 mg/dL (ref 0–99)
Triglycerides: 148 mg/dL (ref 0–149)
VLDL Cholesterol Cal: 26 mg/dL (ref 5–40)

## 2019-04-07 LAB — HEPATIC FUNCTION PANEL
ALT: 17 IU/L (ref 0–32)
AST: 17 IU/L (ref 0–40)
Albumin: 4.2 g/dL (ref 3.8–4.9)
Alkaline Phosphatase: 65 IU/L (ref 39–117)
Bilirubin Total: 0.4 mg/dL (ref 0.0–1.2)
Bilirubin, Direct: 0.11 mg/dL (ref 0.00–0.40)
Total Protein: 6.2 g/dL (ref 6.0–8.5)

## 2019-04-10 ENCOUNTER — Ambulatory Visit: Payer: Self-pay | Admitting: Internal Medicine

## 2019-04-10 ENCOUNTER — Other Ambulatory Visit: Payer: Self-pay

## 2019-04-10 ENCOUNTER — Encounter: Payer: Self-pay | Admitting: Internal Medicine

## 2019-04-10 VITALS — BP 100/60 | HR 68 | Resp 12 | Ht 59.25 in | Wt 166.0 lb

## 2019-04-10 DIAGNOSIS — E782 Mixed hyperlipidemia: Secondary | ICD-10-CM

## 2019-05-09 ENCOUNTER — Other Ambulatory Visit: Payer: Self-pay

## 2019-05-09 DIAGNOSIS — Z20822 Contact with and (suspected) exposure to covid-19: Secondary | ICD-10-CM

## 2019-05-11 LAB — NOVEL CORONAVIRUS, NAA: SARS-CoV-2, NAA: NOT DETECTED

## 2019-06-12 ENCOUNTER — Ambulatory Visit: Payer: Medicaid Other | Attending: Internal Medicine

## 2019-06-12 DIAGNOSIS — Z20828 Contact with and (suspected) exposure to other viral communicable diseases: Secondary | ICD-10-CM | POA: Insufficient documentation

## 2019-06-12 DIAGNOSIS — Z20822 Contact with and (suspected) exposure to covid-19: Secondary | ICD-10-CM

## 2019-06-14 LAB — NOVEL CORONAVIRUS, NAA: SARS-CoV-2, NAA: NOT DETECTED

## 2019-06-18 NOTE — Progress Notes (Signed)
    Subjective:    Patient ID: Felicia Ray, female   DOB: [birth year], 54 y.o.   MRN: [id number]   HPI   Hyperlipidemia:  Tolerating Atorvastatin well.  Discussed her cholesterol total dropped more than 100 points.  All is at goal save for HDL, which is at 37.  Liver enzymes normal. Lipid Panel     Component Value Date/Time   CHOL 146 04/06/2019 0907   TRIG 148 04/06/2019 0907   HDL 37 (L) 04/06/2019 0907   CHOLHDL 8.3 01/13/2019 0334   VLDL UNABLE TO CALCULATE IF TRIGLYCERIDE OVER 400 mg/dL 10/93/2355 7322   LDLCALC 83 04/06/2019 0907   LDLDIRECT 82.5 01/13/2019 0334   LABVLDL 26 04/06/2019 0907     Current Meds  Medication Sig  . albuterol (VENTOLIN HFA) 108 (90 Base) MCG/ACT inhaler Inhale 4 puffs into the lungs every 2 (two) hours as needed for wheezing or shortness of breath.  Marland Kitchen atorvastatin (LIPITOR) 20 MG tablet Take 1 tablet (20 mg total) by mouth daily.  . Cholecalciferol (VITAMIN D3) 50 MCG (2000 UT) TABS Take by mouth. 1 daily  . gabapentin (NEURONTIN) 100 MG capsule 2 caps by mouth in morning and bedtime  . Ipratropium-Albuterol (COMBIVENT) 20-100 MCG/ACT AERS respimat Inhale 1 puff into the lungs every 6 (six) hours.  . Multiple Vitamin (MULTIVITAMIN WITH MINERALS) TABS tablet Take 1 tablet by mouth daily with breakfast.  . niacin 500 MG tablet Take 500 mg by mouth daily after breakfast.  . Omega 3 1200 MG CAPS Take 2 capsules by mouth 2 (two) times daily.   . vitamin E (VITAMIN E) 400 UNIT capsule Take 400 Units by mouth daily.  . [DISCONTINUED] fish oil-omega-3 fatty acids 1000 MG capsule Take 1 g by mouth 2 (two) times daily.   Allergies  Allergen Reactions  . Aspirin Anaphylaxis    "Almost died after she was given this- felt like a heart attack"  . Dipyrone Anaphylaxis and Hives    "Almost died after she was given this- felt like a heart attack" (also)  . Aspirin Other (See Comments)    Cant breath  . Dipyrone Hives  . Other     metamizole -  blindness, n/v (an international drug)     Review of Systems    Objective:   BP 100/60 (BP Location: Left Arm, Patient Position: Sitting, Cuff Size: Normal)   Pulse 68   Resp 12   Ht 4' 11.25" (1.505 m)   Wt 166 lb (75.3 kg)   LMP  (LMP Unknown)   BMI 33.25 kg/m   Physical Exam  NAD Lungs:  CTA CV:  RRR without murmur or rub   Radial pulses normal and equal Abd:  S, NT, No HSM or mass, + BS   Assessment & Plan   Hyperlipidemia:  Much improved control on Atorvastatin.  CPM.  Encouraged continued work on diet and regular daily physical activity as well.

## 2019-06-20 ENCOUNTER — Telehealth: Payer: Self-pay | Admitting: *Deleted

## 2019-06-20 NOTE — Telephone Encounter (Signed)
Pt returned call from automated result line. Pt informed the result was negative, pt also saw in MyChart, no further questions from pt.

## 2019-06-22 ENCOUNTER — Ambulatory Visit: Payer: Medicaid Other | Attending: Internal Medicine

## 2019-06-22 DIAGNOSIS — Z20822 Contact with and (suspected) exposure to covid-19: Secondary | ICD-10-CM

## 2019-06-24 LAB — NOVEL CORONAVIRUS, NAA: SARS-CoV-2, NAA: NOT DETECTED

## 2019-07-12 ENCOUNTER — Encounter: Payer: Self-pay | Admitting: Internal Medicine

## 2019-07-12 ENCOUNTER — Ambulatory Visit: Payer: Self-pay | Admitting: Internal Medicine

## 2019-07-12 ENCOUNTER — Other Ambulatory Visit: Payer: Self-pay

## 2019-07-12 VITALS — BP 122/72 | HR 62 | Resp 12 | Ht 59.25 in | Wt 163.0 lb

## 2019-07-12 DIAGNOSIS — R1012 Left upper quadrant pain: Secondary | ICD-10-CM

## 2019-07-12 DIAGNOSIS — Z1231 Encounter for screening mammogram for malignant neoplasm of breast: Secondary | ICD-10-CM

## 2019-07-12 DIAGNOSIS — E66811 Obesity, class 1: Secondary | ICD-10-CM

## 2019-07-12 DIAGNOSIS — J3089 Other allergic rhinitis: Secondary | ICD-10-CM | POA: Insufficient documentation

## 2019-07-12 DIAGNOSIS — E669 Obesity, unspecified: Secondary | ICD-10-CM

## 2019-07-12 DIAGNOSIS — E782 Mixed hyperlipidemia: Secondary | ICD-10-CM

## 2019-07-12 DIAGNOSIS — Z6832 Body mass index (BMI) 32.0-32.9, adult: Secondary | ICD-10-CM

## 2019-07-12 DIAGNOSIS — Z0000 Encounter for general adult medical examination without abnormal findings: Secondary | ICD-10-CM

## 2019-07-12 NOTE — Patient Instructions (Addendum)
Return stool cards in 2 weeks with fasting labs  Call if worsening pain in left abdomen or if new symptoms associated with the pain

## 2019-07-12 NOTE — Progress Notes (Signed)
Subjective:    Patient ID: Felicia Ray, female   DOB: 1965/08/28, 54 y.o.   MRN: [id number]   HPI   CPE without pap  1.  Pap:  Last pap was 07/06/2017 and normal.  2.  Mammogram:  Last 03/24/2017 and normal.  No family history of breast cancer.   3.  Osteoprevention:  Takes Vitamin D3, but not calcium.  Does have 3 servings of dairy daily, however. Walking daily for 30 minutes.  Could work up to 60 minutes daily.  4.  Guaiac Cards:  Never.    5.  Colonoscopy:  Never.  No family history of colon cancer.  6.  Immunizations:   Immunization History  Administered Date(s) Administered  . Influenza,inj,Quad PF,6+ Mos 03/26/2016, 04/04/2018  . Influenza-Unspecified 04/14/2016, 03/30/2019  . Pneumococcal Polysaccharide-23 01/13/2019  . Tdap 02/20/2016     7.  Glucose/Cholesterol  Current Meds  Medication Sig  . atorvastatin (LIPITOR) 20 MG tablet Take 1 tablet (20 mg total) by mouth daily.  . Cholecalciferol (VITAMIN D3) 50 MCG (2000 UT) TABS Take by mouth. 1 daily  . Magnesium 250 MG TABS Take by mouth. 1 daily  . Multiple Vitamin (MULTIVITAMIN WITH MINERALS) TABS tablet Take 1 tablet by mouth daily with breakfast.  . niacin 500 MG tablet Take 500 mg by mouth daily after breakfast.  . Omega 3 1200 MG CAPS Take 2 capsules by mouth 2 (two) times daily.   . vitamin E (VITAMIN E) 400 UNIT capsule Take 400 Units by mouth daily.   Allergies  Allergen Reactions  . Aspirin Anaphylaxis    "Almost died after she was given this- felt like a heart attack"  . Dipyrone Anaphylaxis and Hives    "Almost died after she was given this- felt like a heart attack" (also)  . Aspirin Other (See Comments)    Cant breath  . Dipyrone Hives  . Other     metamizole - blindness, n/v (an international drug)   Past Medical History:  Diagnosis Date  . Asthma   . Chronic pain   . Chronic pain syndrome 01/23/2014   Fall by slipping on water at work.  Worker's Comp case.  Followed by  Dr. Corine Shelter,  and [facility].  Waiting for second opinion.  Not clear why left low back and knee are not improving.  Called neuropathic pain by Dr. Ethelene Hal.  . Environmental and seasonal allergies   . HLD (hyperlipidemia)   . Hypercholesteremia     Past Surgical History:  Procedure Laterality Date  . TUBAL LIGATION  07/20/2005  . TUBAL LIGATION     Family History  Problem Relation Age of Onset  . Hypertension Mother   . Asthma Mother   . Hyperlipidemia Mother   . Obesity Mother   . Heart disease Mother        CHF by description  . Diabetes Sister   . Hypertension Sister   . Hyperlipidemia Sister   . Hypertension Brother   . Hyperlipidemia Brother     Social History   Socioeconomic History  . Marital status: Single    Spouse name: Not on file  . Number of children: 2  . Years of education: 4  . Highest education level: Not on file  Occupational History  . Occupation: Works at [employer]  . Smoking status: Never Smoker  . Smokeless tobacco: Never Used  Substance and Sexual Activity  . Alcohol use: Never  . Drug use:  Never  . Sexual activity: Not Currently  Other Topics Concern  . Not on file  Social History Narrative      Originally from [place]   Moved to [place] in 2004   Divorced husband in [place] due to his alcohol abuse.    He did not abuse her.   In 10 year relationship with current boyfriend.  They really are separated, but continue to live in the same home.   LIves at home    with boyfriend and 57 yo daughter   Social Determinants of Radio broadcast assistant Strain:   . Difficulty of Paying Living Expenses:   Food Insecurity:   . Worried About Charity fundraiser in the Last Year:   . Arboriculturist in the Last Year:   Transportation Needs:   . Film/video editor (Medical):   Marland Kitchen Lack of Transportation (Non-Medical):   Physical Activity:   . Days of Exercise per Week:   . Minutes of Exercise per Session:   Stress:   .  Feeling of Stress :   Social Connections:   . Frequency of Communication with Friends and Family:   . Frequency of Social Gatherings with Friends and Family:   . Attends Religious Services:   . Active Member of Clubs or Organizations:   . Attends Archivist Meetings:   Marland Kitchen Marital Status:   Intimate Partner Violence:   . Fear of Current or Ex-Partner:   . Emotionally Abused:   Marland Kitchen Physically Abused:   . Sexually Abused:      Review of Systems  Constitutional: Negative for appetite change and fatigue.  HENT: Negative for hearing loss, rhinorrhea, sneezing and sore throat.   Eyes: Positive for visual disturbance (Describes eye fatigue/blurriness after watching TV for more than 1/2 hour).  Respiratory: Negative for shortness of breath.   Cardiovascular: Negative for chest pain, palpitations and leg swelling.  Gastrointestinal: Positive for abdominal pain (LUQ pain--has had for 1 month.  Comes and goes--may have for 3 days.  Can have at end of meal.  No change with flatus or BM.  No associated Nausea or vomiting.  Sharp pain.  Takes an ibuprofen with some help.  Chamomile tea helps well.  ). Negative for blood in stool (No melena.), constipation and diarrhea.  Genitourinary: Negative for dysuria, frequency and vaginal discharge.  Musculoskeletal: Negative for arthralgias.  Skin: Negative for rash.  Neurological: Negative for weakness and numbness.       Tingling in feet at times.  Psychiatric/Behavioral: Negative for dysphoric mood. The patient is not nervous/anxious.       Objective:   BP 122/72 (BP Location: Left Arm, Patient Position: Sitting, Cuff Size: Normal)   Pulse 62   Resp 12   Ht 4' 11.25" (1.505 m)   Wt 163 lb (73.9 kg)   BMI 32.64 kg/m   Physical Exam  Constitutional: She is oriented to person, place, and time. She appears well-developed and well-nourished.  HENT:  Head: Normocephalic and atraumatic.  Right Ear: Hearing, tympanic membrane, external ear  and ear canal normal.  Left Ear: Hearing, tympanic membrane, external ear and ear canal normal.  Nose: Nose normal.  Mouth/Throat: Uvula is midline, oropharynx is clear and moist and mucous membranes are normal.  Eyes: Pupils are equal, round, and reactive to light. Conjunctivae and EOM are normal.  Neck: No thyromegaly present.  Cardiovascular: Normal rate, regular rhythm, S1 normal and S2 normal. Exam reveals no S3, no S4  and no friction rub.  No murmur heard. No carotid bruits.  Carotid, radial, femoral, DP and PT pulses normal and equal.   Pulmonary/Chest: Effort normal and breath sounds normal. Right breast exhibits no inverted nipple, no mass, no nipple discharge, no skin change and no tenderness. Left breast exhibits no inverted nipple, no mass, no nipple discharge, no skin change and no tenderness.  Abdominal: Soft. Bowel sounds are normal. She exhibits no mass. There is no hepatosplenomegaly. There is abdominal tenderness (Tenderness in LUQ up under costal margin.  ). No hernia.  Genitourinary:    Genitourinary Comments: Normal external female genitalia. No uterine or adnexal mass or tenderness.   Musculoskeletal:        General: Normal range of motion.     Cervical back: Full passive range of motion without pain, normal range of motion and neck supple.  Lymphadenopathy:       Head (right side): No submental and no submandibular adenopathy present.       Head (left side): No submental and no submandibular adenopathy present.    She has no cervical adenopathy.    She has no axillary adenopathy.       Right: No inguinal and no supraclavicular adenopathy present.       Left: No inguinal and no supraclavicular adenopathy present.  Neurological: She is alert and oriented to person, place, and time. She has normal strength and normal reflexes. No cranial nerve deficit or sensory deficit. Coordination and gait normal.  Skin: Skin is warm. No rash noted.  Psychiatric: She has a normal mood  and affect. Her speech is normal and behavior is normal. Judgment and thought content normal. Cognition and memory are normal.    Assessment & Plan  1.  CPE without pap Mammogram Guaiac cards x 3 to return in 2 weeks. Fasting labs in 2 weeks when returns cards:  FLP, CMP, CBC Encourage COVID 19 vaccine when she meets criteria.  2.  LUQ tenderness and pain:  Labs above when returns fasting.  3.  Obesity/hyperlipidemia:  Labs and to work on diet and daily physical activity for weight loss.  4.  Allergies:  OTC long acting antihistamine as needed when pollen starts up.

## 2019-07-21 ENCOUNTER — Other Ambulatory Visit: Payer: Self-pay | Admitting: Internal Medicine

## 2019-07-26 ENCOUNTER — Telehealth: Payer: Self-pay | Admitting: Internal Medicine

## 2019-07-26 ENCOUNTER — Emergency Department (HOSPITAL_COMMUNITY): Payer: Self-pay

## 2019-07-26 ENCOUNTER — Emergency Department (HOSPITAL_COMMUNITY)
Admission: EM | Admit: 2019-07-26 | Discharge: 2019-07-26 | Disposition: A | Discharge status: Home / Self Care | Payer: Self-pay | Attending: Emergency Medicine | Admitting: Emergency Medicine

## 2019-07-26 ENCOUNTER — Encounter (HOSPITAL_COMMUNITY): Payer: Self-pay | Admitting: *Deleted

## 2019-07-26 ENCOUNTER — Other Ambulatory Visit: Payer: Self-pay

## 2019-07-26 DIAGNOSIS — Z79899 Other long term (current) drug therapy: Secondary | ICD-10-CM | POA: Insufficient documentation

## 2019-07-26 DIAGNOSIS — K2900 Acute gastritis without bleeding: Secondary | ICD-10-CM | POA: Insufficient documentation

## 2019-07-26 DIAGNOSIS — Z8616 Personal history of COVID-19: Secondary | ICD-10-CM | POA: Insufficient documentation

## 2019-07-26 DIAGNOSIS — J45909 Unspecified asthma, uncomplicated: Secondary | ICD-10-CM | POA: Insufficient documentation

## 2019-07-26 LAB — CBC
HCT: 41.9 % (ref 36.0–46.0)
Hemoglobin: 13.8 g/dL (ref 12.0–15.0)
MCH: 29.4 pg (ref 26.0–34.0)
MCHC: 32.9 g/dL (ref 30.0–36.0)
MCV: 89.1 fL (ref 80.0–100.0)
Platelets: 245 10*3/uL (ref 150–400)
RBC: 4.7 MIL/uL (ref 3.87–5.11)
RDW: 13.5 % (ref 11.5–15.5)
WBC: 5.6 10*3/uL (ref 4.0–10.5)
nRBC: 0 % (ref 0.0–0.2)

## 2019-07-26 LAB — COMPREHENSIVE METABOLIC PANEL
ALT: 23 U/L (ref 0–44)
AST: 21 U/L (ref 15–41)
Albumin: 4.2 g/dL (ref 3.5–5.0)
Alkaline Phosphatase: 73 U/L (ref 38–126)
Anion gap: 7 (ref 5–15)
BUN: 18 mg/dL (ref 6–20)
CO2: 23 mmol/L (ref 22–32)
Calcium: 8.9 mg/dL (ref 8.9–10.3)
Chloride: 107 mmol/L (ref 98–111)
Creatinine, Ser: 0.54 mg/dL (ref 0.44–1.00)
GFR calc Af Amer: >60 mL/min (ref >60)
GFR calc non Af Amer: >60 mL/min (ref >60)
Glucose, Bld: 116 mg/dL — ABNORMAL HIGH (ref 70–99)
Potassium: 3.6 mmol/L (ref 3.5–5.1)
Sodium: 137 mmol/L (ref 135–145)
Total Bilirubin: 0.3 mg/dL (ref 0.3–1.2)
Total Protein: 7.1 g/dL (ref 6.5–8.1)

## 2019-07-26 LAB — I-STAT BETA HCG BLOOD, ED (MC, WL, AP ONLY): I-stat hCG, quantitative: <5 m[IU]/mL (ref <5)

## 2019-07-26 LAB — URINALYSIS, ROUTINE W REFLEX MICROSCOPIC
Bacteria, UA: NONE SEEN
Bilirubin Urine: NEGATIVE
Glucose, UA: NEGATIVE mg/dL
Hgb urine dipstick: NEGATIVE
Ketones, ur: NEGATIVE mg/dL
Leukocytes,Ua: NEGATIVE
Nitrite: NEGATIVE
Protein, ur: NEGATIVE mg/dL
Specific Gravity, Urine: 1.011 (ref 1.005–1.030)
pH: 6 (ref 5.0–8.0)

## 2019-07-26 LAB — POC OCCULT BLOOD, ED: Fecal Occult Bld: NEGATIVE

## 2019-07-26 LAB — LIPASE, BLOOD: Lipase: 32 U/L (ref 11–51)

## 2019-07-26 MED ORDER — OMEPRAZOLE 20 MG PO CPDR: 20.0000 mg | DELAYED_RELEASE_CAPSULE | Freq: Every day | ORAL | 0 refills | Status: DC

## 2019-07-26 MED ORDER — IOHEXOL 300 MG/ML  SOLN
100.0000 mL | Freq: Once | INTRAMUSCULAR | Status: AC | PRN
  Administered 2019-07-26: 100 mL via INTRAVENOUS

## 2019-07-26 MED ORDER — SODIUM CHLORIDE (PF) 0.9 % IJ SOLN
INTRAMUSCULAR | Status: AC
  Administered 2019-07-26: 20:00:00 5 mL
  Filled 2019-07-26: qty 50

## 2019-07-26 MED ORDER — ONDANSETRON 8 MG PO TBDP
8.0000 mg | ORAL_TABLET | Freq: Once | ORAL | Status: AC
  Administered 2019-07-26: 8 mg via ORAL
  Filled 2019-07-26: qty 1

## 2019-07-26 MED ORDER — FAMOTIDINE IN NACL 20-0.9 MG/50ML-% IV SOLN
20.0000 mg | Freq: Once | INTRAVENOUS | Status: AC
  Administered 2019-07-26: 20 mg via INTRAVENOUS
  Filled 2019-07-26: qty 50

## 2019-07-26 MED ORDER — SODIUM CHLORIDE 0.9% FLUSH: 3.0000 mL | Freq: Once | INTRAVENOUS | Status: DC

## 2019-07-26 MED ORDER — SODIUM CHLORIDE 0.9 % IV BOLUS
1000.0000 mL | Freq: Once | INTRAVENOUS | Status: AC
  Administered 2019-07-26: 1000 mL via INTRAVENOUS

## 2019-07-26 MED ORDER — STERILE WATER FOR INJECTION IJ SOLN
INTRAMUSCULAR | Status: AC
  Filled 2019-07-26: qty 10

## 2019-07-26 NOTE — ED Notes (Signed)
Pt transported to CT ?

## 2019-07-26 NOTE — ED Notes (Signed)
Provided pt with water.

## 2019-07-26 NOTE — Telephone Encounter (Signed)
Patient called requesting appointment to be see today due to be experiencing really bad pain and burning on her left side of her chest under her breast. Patient stated pain started last night around 8:00pm after feel bloated. She stated today is having nausea.  Patient denied shortness of breath or difficulties to breath; no headaches, vomiting  or fatigue.  Please advise.

## 2019-07-26 NOTE — Telephone Encounter (Signed)
Spoke with patient and who answer to Dr Delrae Alfred questions as follow:   What did she eat? Patient stated eat a green apple at 7:30 pm; patient stated wasn't feeling hungry prior to that pt. Stated eat lunch at 3:00 pm mix green salad and steam beef.  Has she taken any antacids? Pt. Stated no  Any black stool or black sticky stools? Patient stated has having since last night black sticky stool .  Any shoulder or back pain? Pt. Stated no   Ask if she has been burping a lot and if she feels like she has a lot of gas, but the aleve can make it worse. Pt. Stated no.

## 2019-07-26 NOTE — Discharge Instructions (Signed)
Lo han visto hoy por dolor abdominal. Lea y siga todas las instrucciones proporcionadas. Regrese a la sala de emergencias si su condicin empeora o si presenta nuevos sntomas preocupantes.  La tomografa computarizada muestra que probablemente tenga gastritis.  1. Medicamentos: Receta enviada a su farmacia para Prilosec. Este es un medicamento que se [place] para tratar la gastritis. Tome este medicamento durante 1 mes segn lo prescrito.  Continuar con los medicamentos caseros [employer] segn lo prescrito. Revise todos los medicamentos y tmelos solo si no es alrgico a ellos.  2. Tratamiento: descansar, beber muchos lquidos.  3. Seguimiento: haga un seguimiento con el proveedor de atencin primaria programando una cita lo antes posible para una visita.    Tambin existe la posibilidad de que tenga una reaccin alrgica a cualquiera de los medicamentos que le han recetado: todo el mundo reacciona de [place] diferente a los medicamentos y, aunque la MAYORA de las personas no tienen problemas con la mayora de los [place], es posible que tenga una reaccin como nuseas, vmitos, sarpullido , hinchazn, dificultad para respirar. Si este es el caso, deje de tomar el medicamento inmediatamente y comunquese con su mdico.

## 2019-07-26 NOTE — ED Triage Notes (Signed)
Spanish language line: Dayna [phone] Pt states pain started yesterday in upper left quad, accompanied with nausea. Pain is a burning/stabbing.

## 2019-07-26 NOTE — ED Notes (Signed)
Pt returned from CT. Kaitlyn PA at bedside and used interpreter to explain POC occult procedure. Interpreter number [id number].  This Clinical research associate chaperoned collection of POC occult.

## 2019-07-26 NOTE — Telephone Encounter (Signed)
Per Dr. Delrae Alfred patient advised to go to the ER. Britt Boozer interpreted and patient verbalized understanding.

## 2019-07-26 NOTE — Telephone Encounter (Signed)
Spoke with Dr. Delrae Alfred regarding patient. Wants to know if:  What did she eat? Has she taken any antacids? Any black stool or black sticky stools? Any shoulder or back pain?  Ask if she has been burping a lot and if she feels like she has a lot of gas, but the aleve can make it worse.

## 2019-07-26 NOTE — ED Provider Notes (Signed)
[facility]-EMERGENCY DEPT Provider Note   CSN: [id number] Arrival date & time: 07/26/19  1602     History Chief Complaint  Patient presents with  . Abdominal Pain  . Nausea    Felicia Ray is a 54 y.o. female past medical history significant for asthma, chronic pain syndrome, hyperlipidemia presents to emergency department today with chief complaint of left upper quadrant abdominal pain and nausea x1 day.  She states the pain has been constant.  It started around 8 PM last night.  She describes the pain as a sharp and burning sensation.  It does not radiate.  She has associated nausea without emesis.  She has not taken any medications for symptoms prior to arrival.  She rates the pain 8 of 10 in severity.  She does also state that last night she had a bowel movement and it was dark almost black in color.  She denies history of the same.  She is not currently taking any iron supplements, Pepto-Bismol.  She does admit to taking Naproxen for pain today without symptom relief. She denies any fever, chills, chest pain, shortness of breath, urinary symptoms, diarrhea.  Denies abdominal surgical history.  Denies alcohol consumption.  Due to language barrier, a video interpreter was present during the history-taking and subsequent discussion (and for part of the physical exam) with this patient.   Past Medical History:  Diagnosis Date  . Asthma   . Chronic pain   . Chronic pain syndrome 01/23/2014   Fall by slipping on water at work.  Worker's Comp case.  Followed by Dr. Corine Shelter,  and [facility].  Waiting for second opinion.  Not clear why left low back and knee are not improving.  Called neuropathic pain by Dr. Ethelene Hal.  . Environmental and seasonal allergies   . HLD (hyperlipidemia)   . Hypercholesteremia     Patient Active Problem List   Diagnosis Date Noted  . Environmental and seasonal allergies   . Pneumonia due to COVID-19 virus 01/12/2019  .  Asthma 01/12/2019  . HLD (hyperlipidemia) 01/12/2019  . Class 2 severe obesity with serious comorbidity and body mass index (BMI) of 35.0 to 35.9 in adult [facility]) 12/08/2018  . Environmental allergies 08/04/2018  . Hypercholesteremia   . Long term use of drug 06/12/2016  . Onychomycosis of toenail 05/01/2016  . Chronic pain 06/26/2015  . Decreased visual acuity 06/26/2015  . Mixed hyperlipidemia 06/26/2015  . Depression 06/26/2015    Past Surgical History:  Procedure Laterality Date  . TUBAL LIGATION  07/20/2005  . TUBAL LIGATION       OB History   No obstetric history on file.     Family History  Problem Relation Age of Onset  . Hypertension Mother   . Asthma Mother   . Hyperlipidemia Mother   . Obesity Mother   . Heart disease Mother        CHF by description  . Diabetes Sister   . Hypertension Sister   . Hyperlipidemia Sister   . Hypertension Brother   . Hyperlipidemia Brother     Social History   Tobacco Use  . Smoking status: Never Smoker  . Smokeless tobacco: Never Used  Substance Use Topics  . Alcohol use: Never  . Drug use: Never    Home Medications Prior to Admission medications   Medication Sig Start Date End Date Taking? Authorizing Provider  Ascorbic Acid (VITAMIN C) 100 MG tablet Take 100 mg by mouth daily.  Yes [provider]  atorvastatin (LIPITOR) 40 MG tablet TAKE 1/2 TABLET (20MG  TOTAL) BY MOUTH DAILY Patient taking differently: Take 20 mg by mouth daily.  07/21/19  Yes Mack Hook, MD  Cholecalciferol (VITAMIN D3) 50 MCG (2000 UT) TABS Take by mouth. 1 daily   Yes [provider]  Multiple Vitamin (MULTIVITAMIN WITH MINERALS) TABS tablet Take 1 tablet by mouth daily with breakfast.   Yes [provider]  naproxen sodium (ALEVE) 220 MG tablet Take 220 mg by mouth 2 (two) times daily as needed (pain).   Yes [provider]  niacin 500 MG tablet Take 500 mg by mouth daily after breakfast.   Yes [provider]  Omega 3 1200 MG CAPS Take 2 capsules by mouth 2 (two) times daily.    Yes [provider]  omeprazole (PRILOSEC) 20 MG capsule Take 1 capsule (20 mg total) by mouth daily. 07/26/19 08/25/19  ,  E, PA-C  albuterol (VENTOLIN HFA) 108 (90 Base) MCG/ACT inhaler Inhale 4 puffs into the lungs every 2 (two) hours as needed for wheezing or shortness of breath. Patient not taking: Reported on 07/12/2019 01/15/19 07/26/19  Nita Sells, MD  cetirizine (ZYRTEC) 10 MG tablet Take 1 tablet (10 mg total) by mouth daily. Patient not taking: Reported on 02/09/2019 08/04/18 07/26/19  Mack Hook, MD    Allergies    Aspirin, Dipyrone, Aspirin, Dipyrone, and Other  Review of Systems   Review of Systems  All other systems are reviewed and are negative for acute change except as noted in the HPI.   Physical Exam Updated Vital Signs BP 111/83 (BP Location: Left Arm)   Pulse 76   Temp 98 F (36.7 C) (Oral)   Resp 18   Wt 76.9 kg   SpO2 98%   BMI 33.97 kg/m   Physical Exam Vitals and nursing note reviewed.  Constitutional:      General: She is not in acute distress.    Appearance: She is not ill-appearing.  HENT:     Head: Normocephalic and atraumatic.     Right Ear: Tympanic membrane and external ear normal.     Left Ear: Tympanic membrane and external ear normal.     Nose: Nose normal.     Mouth/Throat:     Mouth: Mucous membranes are moist.     Pharynx: Oropharynx is clear.  Eyes:     General: No scleral icterus.       Right eye: No discharge.        Left eye: No discharge.     Extraocular Movements: Extraocular movements intact.     Conjunctiva/sclera: Conjunctivae normal.     Pupils: Pupils are equal, round, and reactive to light.  Neck:     Vascular: No JVD.  Cardiovascular:     Rate and Rhythm: Normal rate and regular rhythm.     Pulses: Normal pulses.          Radial pulses are 2+ on the right side and 2+ on the left side.      Heart sounds: Normal heart sounds.  Pulmonary:     Comments: Lungs clear to auscultation in all fields. Symmetric chest rise. No wheezing, rales, or rhonchi. Abdominal:     Tenderness: There is no right CVA tenderness or left CVA tenderness.     Comments: Abdomen is soft, non-distended.  Tenderness to palpation in left upper quadrant. No rigidity, no guarding. No peritoneal signs.  Normoactive bowel sounds.  Genitourinary:  Comments: [employer] EMT present for exam. Digital Rectal Exam reveals sphincter with good tone. No external hemorrhoids. No masses or fissures. Stool color is brown with no overt blood. No gross melena.  Musculoskeletal:        General: Normal range of motion.     Cervical back: Normal range of motion.  Skin:    General: Skin is warm and dry.     Capillary Refill: Capillary refill takes less than 2 seconds.  Neurological:     Mental Status: She is oriented to person, place, and time.     GCS: GCS eye subscore is 4. GCS verbal subscore is 5. GCS motor subscore is 6.     Comments: Fluent speech, no facial droop.  Psychiatric:        Behavior: Behavior normal.       ED Results / Procedures / Treatments   Labs (all labs ordered are listed, but only abnormal results are displayed) Labs Reviewed  COMPREHENSIVE METABOLIC PANEL - Abnormal; Notable for the following components:      Result Value   Glucose, Bld 116 (*)    All other components within normal limits  URINALYSIS, ROUTINE W REFLEX MICROSCOPIC - Abnormal; Notable for the following components:   Color, Urine STRAW (*)    All other components within normal limits  LIPASE, BLOOD  CBC  I-STAT BETA HCG BLOOD, ED ([facility], [facility], [facility] ONLY)  POC OCCULT BLOOD, ED    EKG None  Radiology CT ABDOMEN PELVIS W CONTRAST  Result Date: 07/26/2019 CLINICAL DATA:  Left upper quadrant pain. Exquisitely tender in the left upper quadrant. EXAM: CT ABDOMEN AND PELVIS WITH CONTRAST TECHNIQUE: Multidetector CT imaging of  the abdomen and pelvis was performed using the standard protocol following bolus administration of intravenous contrast. CONTRAST:  OMNIPAQUE IOHEXOL 300 MG/ML  SOLN COMPARISON:  CT 09/13/2014 FINDINGS: Lower chest: Lung bases are clear. No focal airspace disease or pleural fluid. Heart is normal in size. Hepatobiliary: No focal liver abnormality is seen. No gallstones, gallbladder wall thickening, or biliary dilatation. Pancreas: No ductal dilatation or inflammation. Spleen: Normal in size without focal abnormality. Small splenule inferiorly. Adrenals/Urinary Tract: Normal adrenal glands. No hydronephrosis or perinephric edema. Homogeneous renal enhancement with symmetric excretion on delayed phase imaging. No renal mass. Urinary bladder is physiologically distended without wall thickening. Stomach/Bowel: Equivocal gastric rugal fold thickening. No perigastric inflammation. Normal positioning of the ligament of Treitz. Unremarkable small bowel without obstruction or inflammation. Normal appendix. Moderate volume of stool throughout the colon. No colonic wall thickening. No pericolonic edema. Vascular/Lymphatic: Abdominal aorta is normal in caliber. IVC is unremarkable. The portal vein is patent. Mesenteric vessels are patent. No adenopathy. Reproductive: Uterus and bilateral adnexa are unremarkable. Other: No free air, free fluid, or intra-abdominal fluid collection. Tiny fat containing umbilical hernia. Musculoskeletal: There are no acute or suspicious osseous abnormalities. Lower lumbar facet hypertrophy with resultant grade 1 anterolisthesis of L4 on L5. IMPRESSION: 1. Equivocal gastric rugal fold thickening, can be seen with gastritis. Otherwise no acute abnormality in the abdomen/pelvis. 2. Moderate colonic stool burden. Electronically Signed   By: Narda Rutherford M.D.   On: 07/26/2019 19:17    Procedures Procedures (including critical care time)  Medications Ordered in ED Medications  sodium  chloride flush (NS) 0.9 % injection 3 mL (3 mLs Intravenous Not Given 07/26/19 1722)  sterile water (preservative free) injection (  Not Given 07/26/19 2034)  ondansetron (ZOFRAN-ODT) disintegrating tablet 8 mg (8 mg Oral Given 07/26/19  1845)  sodium chloride 0.9 % bolus 1,000 mL (1,000 mLs Intravenous Bolus from Bag 07/26/19 1952)  sodium chloride (PF) 0.9 % injection (5 mLs  Given 07/26/19 2020)  iohexol (OMNIPAQUE) 300 MG/ML solution 100 mL (100 mLs Intravenous Contrast Given 07/26/19 1901)  famotidine (PEPCID) IVPB 20 mg premix (20 mg Intravenous New Bag/Given (Non-Interop) 07/26/19 2003)    ED Course  I have reviewed the triage vital signs and the nursing notes.  Pertinent labs & imaging results that were available during my care of the patient were reviewed by me and considered in my medical decision making (see chart for details).    MDM Rules/Calculators/A&P                      Patient presents to the ED with complaints of abdominal pain. Patient nontoxic appearing, in no apparent distress, vitals WNL On exam patient tender to left upper quadrant, no peritoneal signs. Will evaluate with labs and CT AP given exquisite focal tenderness on exam. Analgesics, anti-emetics, and fluids administered.  DDx includes gastritis, constipation, UTI, pancreatitis, biliary colic, cholecystitis, diverticulitis, less likely SBO as she has no abdominal surgical history.  Labs reviewed and grossly unremarkable. No leukocytosis, no anemia, no significant electrolyte derangements. LFTs, renal function, and lipase WNL. Urinalysis without obvious infection.  Was also reporting that she had black stool last night.  Rectal exam performed with chaperone present.  No gross melena on exam.  Fecal occult negative. CT abdomen pelvis shows findings suggestive of gastritis and moderate stool burden. Discussed findings with patient including OTC constipation management. Patient given IV pepcid while in the emergency department  and on reassessment reports pain has improved.  Serial abdominal exams are benign. Patient tolerating PO in the emergency department. Will discharge home with supportive measures. I discussed results, treatment plan, need for PCP follow-up, and return precautions with the patient. Provided opportunity for questions, patient confirmed understanding and is in agreement with plan. Findings and plan of care discussed with supervising physician Dr. Stevie Kern.   Portions of this note were generated with Scientist, clinical (histocompatibility and immunogenetics). Dictation errors may occur despite best attempts at proofreading.    Final Clinical Impression(s) / ED Diagnoses Final diagnoses:  Acute gastritis without hemorrhage, unspecified gastritis type    Rx / DC Orders ED Discharge Orders         Ordered    omeprazole (PRILOSEC) 20 MG capsule  Daily     07/26/19 2027           Sherene Sires, PA-C 07/26/19 2101    Milagros Loll, MD 07/27/19 [phone]

## 2019-07-27 ENCOUNTER — Other Ambulatory Visit (INDEPENDENT_AMBULATORY_CARE_PROVIDER_SITE_OTHER): Payer: Self-pay

## 2019-07-27 DIAGNOSIS — Z1211 Encounter for screening for malignant neoplasm of colon: Secondary | ICD-10-CM

## 2019-07-27 DIAGNOSIS — Z79899 Other long term (current) drug therapy: Secondary | ICD-10-CM

## 2019-07-27 DIAGNOSIS — E7800 Pure hypercholesterolemia, unspecified: Secondary | ICD-10-CM

## 2019-07-27 LAB — POC HEMOCCULT BLD/STL (HOME/3-CARD/SCREEN)
Card #2 Fecal Occult Blod, POC: NEGATIVE
Card #3 Fecal Occult Blood, POC: NEGATIVE
Fecal Occult Blood, POC: NEGATIVE

## 2019-07-28 LAB — CBC WITH DIFFERENTIAL/PLATELET
Basophils Absolute: 0 10*3/uL (ref 0.0–0.2)
Basos: 0 %
EOS (ABSOLUTE): 0.1 10*3/uL (ref 0.0–0.4)
Eos: 2 %
Hematocrit: 44.4 % (ref 34.0–46.6)
Hemoglobin: 14.5 g/dL (ref 11.1–15.9)
Immature Grans (Abs): 0 10*3/uL (ref 0.0–0.1)
Immature Granulocytes: 0 %
Lymphocytes Absolute: 1.5 10*3/uL (ref 0.7–3.1)
Lymphs: 27 %
MCH: 30 pg (ref 26.6–33.0)
MCHC: 32.7 g/dL (ref 31.5–35.7)
MCV: 92 fL (ref 79–97)
Monocytes Absolute: 0.4 10*3/uL (ref 0.1–0.9)
Monocytes: 7 %
Neutrophils Absolute: 3.5 10*3/uL (ref 1.4–7.0)
Neutrophils: 64 %
Platelets: 245 10*3/uL (ref 150–450)
RBC: 4.84 x10E6/uL (ref 3.77–5.28)
RDW: 13.5 % (ref 11.7–15.4)
WBC: 5.5 10*3/uL (ref 3.4–10.8)

## 2019-07-28 LAB — COMPREHENSIVE METABOLIC PANEL
ALT: 21 IU/L (ref 0–32)
AST: 16 IU/L (ref 0–40)
Albumin/Globulin Ratio: 1.7 (ref 1.2–2.2)
Albumin: 4.3 g/dL (ref 3.8–4.9)
Alkaline Phosphatase: 80 IU/L (ref 39–117)
BUN/Creatinine Ratio: 14 (ref 9–23)
BUN: 10 mg/dL (ref 6–24)
Bilirubin Total: 0.3 mg/dL (ref 0.0–1.2)
CO2: 21 mmol/L (ref 20–29)
Calcium: 9.6 mg/dL (ref 8.7–10.2)
Chloride: 106 mmol/L (ref 96–106)
Creatinine, Ser: 0.74 mg/dL (ref 0.57–1.00)
GFR calc Af Amer: 108 mL/min/{1.73_m2} (ref >59)
GFR calc non Af Amer: 94 mL/min/{1.73_m2} (ref >59)
Globulin, Total: 2.5 g/dL (ref 1.5–4.5)
Glucose: 91 mg/dL (ref 65–99)
Potassium: 5 mmol/L (ref 3.5–5.2)
Sodium: 145 mmol/L — ABNORMAL HIGH (ref 134–144)
Total Protein: 6.8 g/dL (ref 6.0–8.5)

## 2019-07-28 LAB — LIPID PANEL W/O CHOL/HDL RATIO
Cholesterol, Total: 181 mg/dL (ref 100–199)
HDL: 48 mg/dL (ref >39)
LDL Chol Calc (NIH): 108 mg/dL — ABNORMAL HIGH (ref 0–99)
Triglycerides: 140 mg/dL (ref 0–149)
VLDL Cholesterol Cal: 25 mg/dL (ref 5–40)

## 2019-08-31 ENCOUNTER — Telehealth: Payer: Self-pay | Admitting: Internal Medicine

## 2019-08-31 NOTE — Telephone Encounter (Signed)
error 

## 2019-08-31 NOTE — Telephone Encounter (Signed)
Patient requesting Atorvastatin to be sent in to [facility] at [facility] and [address] until renews [employer]. Unable to refill from [facility]

## 2019-09-01 NOTE — Telephone Encounter (Signed)
Pt. Informed and will pick up today. 

## 2019-09-04 ENCOUNTER — Ambulatory Visit
Admission: RE | Admit: 2019-09-04 | Discharge: 2019-09-04 | Disposition: A | Discharge status: Home / Self Care | Payer: No Typology Code available for payment source | Source: Ambulatory Visit | Attending: Internal Medicine | Admitting: Internal Medicine

## 2019-09-04 ENCOUNTER — Other Ambulatory Visit: Payer: Self-pay

## 2019-09-04 ENCOUNTER — Telehealth: Payer: Self-pay | Admitting: Internal Medicine

## 2019-09-04 DIAGNOSIS — Z1231 Encounter for screening mammogram for malignant neoplasm of breast: Secondary | ICD-10-CM

## 2019-09-04 MED ORDER — ATORVASTATIN CALCIUM 40 MG PO TABS: ORAL_TABLET | ORAL | 11 refills | Status: DC

## 2019-09-04 NOTE — Telephone Encounter (Signed)
Patient requesting Atorvastatin to be sent in to [facility] at [place] and [address] until renews [employer]. Unable to refill from [facility] 

## 2019-09-04 NOTE — Telephone Encounter (Signed)
Please notify patient. Rx has been sent to pharmacy

## 2019-09-05 NOTE — Telephone Encounter (Signed)
Patient notified

## 2019-11-29 ENCOUNTER — Telehealth: Payer: Self-pay | Admitting: Internal Medicine

## 2019-11-29 ENCOUNTER — Other Ambulatory Visit: Payer: Self-pay

## 2019-11-29 MED ORDER — ATORVASTATIN CALCIUM 40 MG PO TABS: ORAL_TABLET | ORAL | 11 refills | Status: DC

## 2019-11-29 NOTE — Telephone Encounter (Signed)
Please notify patient that Rx has been sent to pharmacy

## 2019-11-29 NOTE — Telephone Encounter (Signed)
Patient informed. 

## 2019-11-29 NOTE — Telephone Encounter (Signed)
Patient called stating will like to get atorvastatin (LIPITOR) 40 MG tablet to be sent to [facility]. Patient stated has a valid [employer] now.

## 2019-12-05 ENCOUNTER — Other Ambulatory Visit: Payer: Self-pay | Admitting: Internal Medicine

## 2020-01-02 ENCOUNTER — Encounter: Payer: Self-pay | Admitting: Internal Medicine

## 2020-01-02 ENCOUNTER — Ambulatory Visit: Payer: Self-pay | Admitting: Family Medicine

## 2020-01-02 ENCOUNTER — Other Ambulatory Visit: Payer: Self-pay

## 2020-01-02 DIAGNOSIS — G43009 Migraine without aura, not intractable, without status migrainosus: Secondary | ICD-10-CM

## 2020-01-02 DIAGNOSIS — T300 Burn of unspecified body region, unspecified degree: Secondary | ICD-10-CM

## 2020-01-02 NOTE — Patient Instructions (Addendum)
Good to see you today - Thanks for coming in  I think your sudden pains on the left side of your head are migraine headaches. When you feel one starting take - two tablets of naprosyn with a small amount of food.  If you are having more attacks or the naprosyn is not helping or you have any weakness or trouble speaking you should contact us or go to the ER  Keep the burn covered with vaseline or neopsorin and can use ice for pain  Come back to see Dr Delrae Alfred in 6 months to check your cholesterol  Es bueno verte hoy - Gracias por venir  [employer] dolores repentinos en el lado izquierdo de la cabeza son migraas. Cuando sienta que comienza, [id number] tabletas de naprosyn con una pequea cantidad de comida.  Si est teniendo ms ataques o el naprosyn no le est ayudando o si tiene [place] debilidad o dificultad para hablar, debe comunicarse con nosotros o ir a la sala de emergencias.  Mantenga la quemadura cubierta con vaselina o neopsorina y puede usar hielo para Chief Technology Officer.  Vuelva a ver al Dr. Delrae Alfred en 6 meses para controlar su colesterol

## 2020-01-03 DIAGNOSIS — T300 Burn of unspecified body region, unspecified degree: Secondary | ICD-10-CM | POA: Insufficient documentation

## 2020-01-03 DIAGNOSIS — G43909 Migraine, unspecified, not intractable, without status migrainosus: Secondary | ICD-10-CM | POA: Insufficient documentation

## 2020-01-03 NOTE — Assessment & Plan Note (Signed)
Second degree on leg.  Keep covered with ointment and monitor for signs of infection

## 2020-01-03 NOTE — Assessment & Plan Note (Signed)
Presentation most consistent with migraines, given sudden onset with some prodrome and relief with nsaid and sleep.  Unsure why started recently.  No signs of intracranial or carotid lesions.  Normal head ct 2019.   Treat with naprosyn at onset and monitor for red flags.

## 2020-01-03 NOTE — Progress Notes (Signed)
° ° °  SUBJECTIVE:   CHIEF COMPLAINT / HPI:   Neck Head Pain For the last weeks to months has had a Left sided pain that seems to happens acutely, beginning with throbbing that progresses to pain.  Always left sided.  Improves with naprosyn and going to sleep.  Perhaps some visual changes at the beginning.  No loss of vision or any weakness or problems speaking.  No history of trauma.  No prior history of migraines. No pain now.  Burn This AM touched a hot pipe at work and has a burn on her left calf . Quite painful  PERTINENT  PMH / PSH: cholesterol   OBJECTIVE:   BP 98/60 (BP Location: Left Arm, Patient Position: Sitting, Cuff Size: Normal)    Pulse 84    Resp 12    Ht 4' 11.25" (1.505 m)    Wt 165 lb (74.8 kg)    LMP  (LMP Unknown)    BMI 33.05 kg/m   Alert nad Neurologic exam : Cn 2-7 intact Strength equal & normal in upper & lower extremities Able to walk on heels and toes.   Balance normal  Romberg normal, finger to nose normal Eye - Pupils Equal Round Reactive to light, Extraocular movements intact, Fundi without hemorrhage or visible lesions, Conjunctiva without redness or discharge Neck:  No deformities, thyromegaly, masses, or tenderness noted.   Supple with full range of motion without pain. Left calf - 5 cm second degree burn covered with neosporin   ASSESSMENT/PLAN:   Migraine headache Presentation most consistent with migraines, given sudden onset with some prodrome and relief with nsaid and sleep.  Unsure why started recently.  No signs of intracranial or carotid lesions.  Normal head ct 2019.   Treat with naprosyn at onset and monitor for red flags.  Burn Second degree on leg.  Keep covered with ointment and monitor for signs of infection     Carney Living, MD [facility]

## 2020-01-07 ENCOUNTER — Ambulatory Visit (HOSPITAL_COMMUNITY)
Admission: EM | Admit: 2020-01-07 | Discharge: 2020-01-07 | Disposition: A | Discharge status: Home / Self Care | Payer: No Typology Code available for payment source | Attending: Emergency Medicine | Admitting: Emergency Medicine

## 2020-01-07 ENCOUNTER — Encounter (HOSPITAL_COMMUNITY): Payer: Self-pay

## 2020-01-07 ENCOUNTER — Other Ambulatory Visit: Payer: Self-pay

## 2020-01-07 DIAGNOSIS — T300 Burn of unspecified body region, unspecified degree: Secondary | ICD-10-CM

## 2020-01-07 MED ORDER — SILVER SULFADIAZINE 1 % EX CREA
TOPICAL_CREAM | CUTANEOUS | Status: AC
  Filled 2020-01-07: qty 85

## 2020-01-07 MED ORDER — SILVER SULFADIAZINE 1 % EX CREA: TOPICAL_CREAM | Freq: Once | CUTANEOUS | Status: AC

## 2020-01-07 MED ORDER — SILVER SULFADIAZINE 1 % EX CREA: 1.0000 "application " | TOPICAL_CREAM | Freq: Every day | CUTANEOUS | 0 refills | Status: DC

## 2020-01-07 MED ORDER — ACETAMINOPHEN 500 MG PO TABS: 1000.0000 mg | ORAL_TABLET | Freq: Three times a day (TID) | ORAL | 0 refills | Status: DC | PRN

## 2020-01-07 NOTE — Discharge Instructions (Signed)
Cleanse daily with soap and water.  Keep covered to keep clean.  Use of cream provided, once a day.  Tylenol or motrin as needed for pain.  Follow up here or with Dr. Mikey Bussing if no improvement or worsening

## 2020-01-07 NOTE — ED Triage Notes (Signed)
Pt presents to UC for burn on left calf on Tuesday. Pt states burn site is becoming increasingly painful. Upon assessment pt found to have approx 2 x2 inch burn on left calf, blister no longer intact, draining yellow output onto bandage. Pt states pain is worse with ambulation. Pt has been treating with neosporin, and triple antibiotic cream.

## 2020-01-08 NOTE — ED Provider Notes (Signed)
[facility]    CSN: [id number] Arrival date & time: 01/07/20  1653      History   Chief Complaint Chief Complaint  Patient presents with   Burn    HPI Felicia Ray is a 54 y.o. female.   Felicia Ray presents with complaints of burn to her left calf. Initial insult was 7/20. She was at work and her leg pushed up against a pipe which exhausted hot vapor, burning her skin. Blistered. When she removed a dressing she unintentionally pulled blistered skin off too, so now it is open, painful. No pus. Some increased redness. Has been applying neosporin.   Daughter provides spanish interpretation as needed.   ROS per HPI, negative if not otherwise mentioned.      Past Medical History:  Diagnosis Date   Asthma    Chronic pain    Chronic pain syndrome 01/23/2014   Fall by slipping on water at work.  Worker's Comp case.  Followed by Dr. Corine Shelter,  and [facility].  Waiting for second opinion.  Not clear why left low back and knee are not improving.  Called neuropathic pain by Dr. Ethelene Hal.   Environmental and seasonal allergies    HLD (hyperlipidemia)    Hypercholesteremia     Patient Active Problem List   Diagnosis Date Noted   Migraine headache 01/03/2020   Burn 01/03/2020   Environmental and seasonal allergies    Pneumonia due to COVID-19 virus 01/12/2019   Asthma 01/12/2019   HLD (hyperlipidemia) 01/12/2019   Class 2 severe obesity with serious comorbidity and body mass index (BMI) of 35.0 to 35.9 in adult [facility]) 12/08/2018   Environmental allergies 08/04/2018   Hypercholesteremia    Long term use of drug 06/12/2016   Onychomycosis of toenail 05/01/2016   Chronic pain 06/26/2015   Decreased visual acuity 06/26/2015   Mixed hyperlipidemia 06/26/2015   Depression 06/26/2015    Past Surgical History:  Procedure Laterality Date   TUBAL LIGATION  07/20/2005   TUBAL LIGATION      OB History   No obstetric  history on file.      Home Medications    Prior to Admission medications   Medication Sig Start Date End Date Taking? Authorizing Provider  acetaminophen (TYLENOL) 500 MG tablet Take 2 tablets (1,000 mg total) by mouth every 8 (eight) hours as needed. 01/07/20   Georgetta Haber, NP  Ascorbic Acid (VITAMIN C) 100 MG tablet Take 100 mg by mouth daily.    [provider]  atorvastatin (LIPITOR) 40 MG tablet TAKE 1/2 TABLET (20MG  TOTAL) BY MOUTH DAILY 11/29/19   12/01/19, MD  Cholecalciferol (VITAMIN D3) 50 MCG (2000 UT) TABS Take by mouth. 1 daily    [provider]  cyanocobalamin 2000 MCG tablet Take 2,000 mcg by mouth daily.    [provider]  Multiple Vitamin (MULTIVITAMIN WITH MINERALS) TABS tablet Take 1 tablet by mouth daily with breakfast. Patient not taking: Reported on 01/02/2020    [provider]  naproxen sodium (ALEVE) 220 MG tablet Take 220 mg by mouth 2 (two) times daily as needed (pain). Patient not taking: Reported on 01/02/2020    [provider]  niacin 500 MG tablet Take 500 mg by mouth daily after breakfast.    [provider]  Omega 3 1200 MG CAPS Take 2 capsules by mouth 2 (two) times daily.     [provider]  omeprazole (PRILOSEC) 20  MG capsule Take 1 capsule (20 mg total) by mouth daily. 07/26/19 08/25/19  Albrizze, Kaitlyn E, PA-C  silver sulfADIAZINE (SILVADENE) 1 % cream Apply 1 application topically daily. 01/07/20   Georgetta Haber, NP  albuterol (VENTOLIN HFA) 108 (90 Base) MCG/ACT inhaler Inhale 4 puffs into the lungs every 2 (two) hours as needed for wheezing or shortness of breath. Patient not taking: Reported on 07/12/2019 01/15/19 07/26/19  Rhetta Mura, MD  cetirizine (ZYRTEC) 10 MG tablet Take 1 tablet (10 mg total) by mouth daily. Patient not taking: Reported on 02/09/2019 08/04/18 07/26/19  Julieanne Manson, MD    Family History Family History  Problem Relation Age of Onset     Hypertension Mother    Asthma Mother    Hyperlipidemia Mother    Obesity Mother    Heart disease Mother        CHF by description   Diabetes Sister    Hypertension Sister    Hyperlipidemia Sister    Hypertension Brother    Hyperlipidemia Brother     Social History Social History   Tobacco Use   Smoking status: Never Smoker   Smokeless tobacco: Never Used  Building services engineer Use: Never used  Substance Use Topics   Alcohol use: Never   Drug use: Never     Allergies   Aspirin, Dipyrone, Aspirin, Dipyrone, and Other   Review of Systems Review of Systems   Physical Exam Triage Vital Signs ED Triage Vitals [01/07/20 1733]  Enc Vitals Group     BP (!) 128/62     Pulse Rate 98     Resp 16     Temp 98.2 F (36.8 C)     Temp Source Oral     SpO2 100 %     Weight      Height      Head Circumference      Peak Flow      Pain Score 10     Pain Loc      Pain Edu?      Excl. in GC?    No data found.  Updated Vital Signs BP (!) 128/62 (BP Location: Right Arm)    Pulse 98    Temp 98.2 F (36.8 C) (Oral)    Resp 16    LMP  (LMP Unknown)    SpO2 100%   Visual Acuity Right Eye Distance:   Left Eye Distance:   Bilateral Distance:    Right Eye Near:   Left Eye Near:    Bilateral Near:     Physical Exam Constitutional:      General: She is not in acute distress.    Appearance: She is well-developed.  Cardiovascular:     Rate and Rhythm: Normal rate.  Pulmonary:     Effort: Pulmonary effort is normal.  Skin:    General: Skin is warm and dry.          Comments: Approximately 4 cm open burn, circular, red moist open tissue; no drainage, no surrounding redness or induration  Neurological:     Mental Status: She is alert and oriented to person, place, and time.        UC Treatments / Results  Labs (all labs ordered are listed, but only abnormal results are displayed) Labs Reviewed - No data to display  EKG   Radiology No  results found.  Procedures Procedures (including critical care time)  Medications Ordered in UC Medications  silver sulfADIAZINE (SILVADENE) 1 %  cream ( Topical Given 01/07/20 1839)    Initial Impression / Assessment and Plan / UC Course  I have reviewed the triage vital signs and the nursing notes.  Pertinent labs & imaging results that were available during my care of the patient were reviewed by me and considered in my medical decision making (see chart for details).     2nd degree burn, open, healing. No indication of infection at this time. Wound care and expected course of healing discussed. Patient verbalized understanding and agreeable to plan.   Final Clinical Impressions(s) / UC Diagnoses   Final diagnoses:  Burn     Discharge Instructions     Cleanse daily with soap and water.  Keep covered to keep clean.  Use of cream provided, once a day.  Tylenol or motrin as needed for pain.  Follow up here or with Dr. Mikey Bussing if no improvement or worsening   ED Prescriptions    Medication Sig Dispense Auth. Provider   silver sulfADIAZINE (SILVADENE) 1 % cream Apply 1 application topically daily. 50 g Linus Mako B, NP   acetaminophen (TYLENOL) 500 MG tablet Take 2 tablets (1,000 mg total) by mouth every 8 (eight) hours as needed. 30 tablet Georgetta Haber, NP     PDMP not reviewed this encounter.   Georgetta Haber, NP 01/08/20 [phone]

## 2020-04-13 ENCOUNTER — Ambulatory Visit: Payer: No Typology Code available for payment source | Attending: Internal Medicine

## 2020-04-13 DIAGNOSIS — Z23 Encounter for immunization: Secondary | ICD-10-CM

## 2020-04-13 NOTE — Progress Notes (Signed)
   Covid-19 Vaccination Clinic  Name:  Felicia Ray    MRN: [id number] DOB: 20-Jul-1965  04/13/2020  Ms. Felicia Ray was observed post Covid-19 immunization for 30 minutes based on pre-vaccination screening without incident. She was provided with Vaccine Information Sheet and instruction to access the V-Safe system.   Ms. Felicia Ray was instructed to call 911 with any severe reactions post vaccine: Marland Kitchen Difficulty breathing  . Swelling of face and throat  . A fast heartbeat  . A bad rash all over body  . Dizziness and weakness

## 2020-04-18 ENCOUNTER — Emergency Department (HOSPITAL_BASED_OUTPATIENT_CLINIC_OR_DEPARTMENT_OTHER): Payer: No Typology Code available for payment source

## 2020-04-18 ENCOUNTER — Other Ambulatory Visit: Payer: Self-pay

## 2020-04-18 ENCOUNTER — Emergency Department (HOSPITAL_BASED_OUTPATIENT_CLINIC_OR_DEPARTMENT_OTHER)
Admission: EM | Admit: 2020-04-18 | Discharge: 2020-04-19 | Disposition: A | Discharge status: Home / Self Care | Payer: No Typology Code available for payment source | Attending: Emergency Medicine | Admitting: Emergency Medicine

## 2020-04-18 ENCOUNTER — Encounter (HOSPITAL_BASED_OUTPATIENT_CLINIC_OR_DEPARTMENT_OTHER): Payer: Self-pay | Admitting: Emergency Medicine

## 2020-04-18 DIAGNOSIS — R509 Fever, unspecified: Secondary | ICD-10-CM | POA: Insufficient documentation

## 2020-04-18 DIAGNOSIS — Z79899 Other long term (current) drug therapy: Secondary | ICD-10-CM | POA: Insufficient documentation

## 2020-04-18 DIAGNOSIS — Z9851 Tubal ligation status: Secondary | ICD-10-CM | POA: Insufficient documentation

## 2020-04-18 DIAGNOSIS — R002 Palpitations: Secondary | ICD-10-CM | POA: Insufficient documentation

## 2020-04-18 DIAGNOSIS — R0789 Other chest pain: Secondary | ICD-10-CM | POA: Insufficient documentation

## 2020-04-18 DIAGNOSIS — J45909 Unspecified asthma, uncomplicated: Secondary | ICD-10-CM | POA: Insufficient documentation

## 2020-04-18 DIAGNOSIS — R42 Dizziness and giddiness: Secondary | ICD-10-CM | POA: Insufficient documentation

## 2020-04-18 DIAGNOSIS — R079 Chest pain, unspecified: Secondary | ICD-10-CM

## 2020-04-18 DIAGNOSIS — Z8616 Personal history of COVID-19: Secondary | ICD-10-CM | POA: Insufficient documentation

## 2020-04-18 DIAGNOSIS — I10 Essential (primary) hypertension: Secondary | ICD-10-CM | POA: Insufficient documentation

## 2020-04-18 LAB — CBC
HCT: 37.9 % (ref 36.0–46.0)
Hemoglobin: 13 g/dL (ref 12.0–15.0)
MCH: 29.9 pg (ref 26.0–34.0)
MCHC: 34.3 g/dL (ref 30.0–36.0)
MCV: 87.1 fL (ref 80.0–100.0)
Platelets: 232 10*3/uL (ref 150–400)
RBC: 4.35 MIL/uL (ref 3.87–5.11)
RDW: 12.7 % (ref 11.5–15.5)
WBC: 5.5 10*3/uL (ref 4.0–10.5)
nRBC: 0 % (ref 0.0–0.2)

## 2020-04-18 LAB — BASIC METABOLIC PANEL
Anion gap: 10 (ref 5–15)
BUN: 16 mg/dL (ref 6–20)
CO2: 21 mmol/L — ABNORMAL LOW (ref 22–32)
Calcium: 8.6 mg/dL — ABNORMAL LOW (ref 8.9–10.3)
Chloride: 107 mmol/L (ref 98–111)
Creatinine, Ser: 0.77 mg/dL (ref 0.44–1.00)
GFR, Estimated: >60 mL/min (ref >60)
Glucose, Bld: 96 mg/dL (ref 70–99)
Potassium: 3.3 mmol/L — ABNORMAL LOW (ref 3.5–5.1)
Sodium: 138 mmol/L (ref 135–145)

## 2020-04-18 LAB — HEPATIC FUNCTION PANEL
ALT: 23 U/L (ref 0–44)
AST: 22 U/L (ref 15–41)
Albumin: 3.7 g/dL (ref 3.5–5.0)
Alkaline Phosphatase: 55 U/L (ref 38–126)
Bilirubin, Direct: 0.1 mg/dL (ref 0.0–0.2)
Indirect Bilirubin: 0.6 mg/dL (ref 0.3–0.9)
Total Bilirubin: 0.7 mg/dL (ref 0.3–1.2)
Total Protein: 6.5 g/dL (ref 6.5–8.1)

## 2020-04-18 LAB — CBG MONITORING, ED: Glucose-Capillary: 94 mg/dL (ref 70–99)

## 2020-04-18 LAB — TROPONIN I (HIGH SENSITIVITY): Troponin I (High Sensitivity): <2 ng/L (ref <18)

## 2020-04-18 MED ORDER — FAMOTIDINE IN NACL 20-0.9 MG/50ML-% IV SOLN
20.0000 mg | Freq: Once | INTRAVENOUS | Status: AC
  Administered 2020-04-18: 20 mg via INTRAVENOUS
  Filled 2020-04-18: qty 50

## 2020-04-18 MED ORDER — LIDOCAINE VISCOUS HCL 2 % MT SOLN
15.0000 mL | Freq: Once | OROMUCOSAL | Status: AC
  Administered 2020-04-18: 15 mL via ORAL
  Filled 2020-04-18: qty 15

## 2020-04-18 MED ORDER — ALUM & MAG HYDROXIDE-SIMETH 200-200-20 MG/5ML PO SUSP
30.0000 mL | Freq: Once | ORAL | Status: AC
  Administered 2020-04-18: 30 mL via ORAL
  Filled 2020-04-18: qty 30

## 2020-04-18 NOTE — ED Provider Notes (Signed)
[facility] EMERGENCY DEPARTMENT Provider Note   CSN: [id number] Arrival date & time: 04/18/20  2130     History Chief Complaint  Patient presents with  . Chest Pain    Felicia Ray is a 54 y.o. female hx of HTN, HL, here presenting with chest pain and dizziness.  Patient had her Covid booster shot with Pfizer 3 days ago. She states that initially she developed some fevers which is common after her second shot.  Patient states that since then she has been having some intermittent chest pressure.  She states that she has been having palpitations as well.  She was shopping earlier and really feel dizzy and felt like she is on pass out.  She also ate some spicy food earlier and thought she has some reflux.  Patient denies any heart problems in the past.  The history is provided by the patient.       Past Medical History:  Diagnosis Date  . Asthma   . Chronic pain   . Chronic pain syndrome 01/23/2014   Fall by slipping on water at work.  Worker's Comp case.  Followed by Dr. Corine Shelter,  and [facility].  Waiting for second opinion.  Not clear why left low back and knee are not improving.  Called neuropathic pain by Dr. Ethelene Hal.  . Environmental and seasonal allergies   . HLD (hyperlipidemia)   . Hypercholesteremia     Patient Active Problem List   Diagnosis Date Noted  . Migraine headache 01/03/2020  . Burn 01/03/2020  . Environmental and seasonal allergies   . Pneumonia due to COVID-19 virus 01/12/2019  . Asthma 01/12/2019  . HLD (hyperlipidemia) 01/12/2019  . Class 2 severe obesity with serious comorbidity and body mass index (BMI) of 35.0 to 35.9 in adult [facility]) 12/08/2018  . Environmental allergies 08/04/2018  . Hypercholesteremia   . Long term use of drug 06/12/2016  . Onychomycosis of toenail 05/01/2016  . Chronic pain 06/26/2015  . Decreased visual acuity 06/26/2015  . Mixed hyperlipidemia 06/26/2015  . Depression 06/26/2015    Past  Surgical History:  Procedure Laterality Date  . TUBAL LIGATION  07/20/2005  . TUBAL LIGATION       OB History   No obstetric history on file.     Family History  Problem Relation Age of Onset  . Hypertension Mother   . Asthma Mother   . Hyperlipidemia Mother   . Obesity Mother   . Heart disease Mother        CHF by description  . Diabetes Sister   . Hypertension Sister   . Hyperlipidemia Sister   . Hypertension Brother   . Hyperlipidemia Brother     Social History   Tobacco Use  . Smoking status: Never Smoker  . Smokeless tobacco: Never Used  Vaping Use  . Vaping Use: Never used  Substance Use Topics  . Alcohol use: Never  . Drug use: Never    Home Medications Prior to Admission medications   Medication Sig Start Date End Date Taking? Authorizing Provider  acetaminophen (TYLENOL) 500 MG tablet Take 2 tablets (1,000 mg total) by mouth every 8 (eight) hours as needed. 01/07/20   Georgetta Haber, NP  Ascorbic Acid (VITAMIN C) 100 MG tablet Take 100 mg by mouth daily.    [provider]  atorvastatin (LIPITOR) 40 MG tablet TAKE 1/2 TABLET (20MG  TOTAL) BY MOUTH DAILY 11/29/19   12/01/19, MD  Cholecalciferol (VITAMIN D3)  50 MCG (2000 UT) TABS Take by mouth. 1 daily    [provider]  cyanocobalamin 2000 MCG tablet Take 2,000 mcg by mouth daily.    [provider]  Multiple Vitamin (MULTIVITAMIN WITH MINERALS) TABS tablet Take 1 tablet by mouth daily with breakfast. Patient not taking: Reported on 01/02/2020    [provider]  naproxen sodium (ALEVE) 220 MG tablet Take 220 mg by mouth 2 (two) times daily as needed (pain). Patient not taking: Reported on 01/02/2020    [provider]  niacin 500 MG tablet Take 500 mg by mouth daily after breakfast.    [provider]  Omega 3 1200 MG CAPS Take 2 capsules by mouth 2 (two) times daily.     [provider]  omeprazole (PRILOSEC) 20 MG capsule Take 1  capsule (20 mg total) by mouth daily. 07/26/19 08/25/19  Namon Cirri E, PA-C  silver sulfADIAZINE (SILVADENE) 1 % cream Apply 1 application topically daily. 01/07/20   Georgetta Haber, NP  albuterol (VENTOLIN HFA) 108 (90 Base) MCG/ACT inhaler Inhale 4 puffs into the lungs every 2 (two) hours as needed for wheezing or shortness of breath. Patient not taking: Reported on 07/12/2019 01/15/19 07/26/19  Rhetta Mura, MD  cetirizine (ZYRTEC) 10 MG tablet Take 1 tablet (10 mg total) by mouth daily. Patient not taking: Reported on 02/09/2019 08/04/18 07/26/19  Julieanne Manson, MD    Allergies    Aspirin, Dipyrone, Aspirin, Dipyrone, and Other  Review of Systems   Review of Systems  Cardiovascular: Positive for chest pain.  Neurological: Positive for dizziness.  All other systems reviewed and are negative.   Physical Exam Updated Vital Signs BP 115/73   Pulse (!) 53   Temp 97.7 F (36.5 C)   Resp 16   Ht 4\' 11"  (1.499 m)   Wt 74.8 kg   SpO2 97%   BMI 33.31 kg/m   Physical Exam Vitals and nursing note reviewed.  Constitutional:      Comments: Slightly dehydrated  HENT:     Head: Normocephalic.  Eyes:     Extraocular Movements: Extraocular movements intact.     Pupils: Pupils are equal, round, and reactive to light.     Comments: No nystagmus  Cardiovascular:     Rate and Rhythm: Normal rate and regular rhythm.     Heart sounds: Normal heart sounds.  Pulmonary:     Effort: Pulmonary effort is normal.  Abdominal:     General: Bowel sounds are normal.     Palpations: Abdomen is soft.  Musculoskeletal:        General: Normal range of motion.     Cervical back: Normal range of motion.  Skin:    General: Skin is warm.     Capillary Refill: Capillary refill takes less than 2 seconds.  Neurological:     General: No focal deficit present.     Mental Status: She is oriented to person, place, and time.     Comments: Normal strength bilaterally  Psychiatric:         Mood and Affect: Mood normal.     ED Results / Procedures / Treatments   Labs (all labs ordered are listed, but only abnormal results are displayed) Labs Reviewed  BASIC METABOLIC PANEL - Abnormal; Notable for the following components:      Result Value   Potassium 3.3 (*)    CO2 21 (*)    Calcium 8.6 (*)    All other components  within normal limits  CBC  HEPATIC FUNCTION PANEL  URINALYSIS, ROUTINE W REFLEX MICROSCOPIC  CBG MONITORING, ED  TROPONIN I (HIGH SENSITIVITY)  TROPONIN I (HIGH SENSITIVITY)    EKG EKG Interpretation  Date/Time:  Thursday April 18 2020 21:36:38 EDT Ventricular Rate:  54 PR Interval:    QRS Duration: 95 QT Interval:  484 QTC Calculation: 459 R Axis:   69 Text Interpretation: Sinus rhythm Multiple ventricular premature complexes ST elev, probable normal early repol pattern No significant change since last tracing Confirmed by Richardean Canal [phone]) on 04/18/2020 10:01:44 PM   Radiology CT Head Wo Contrast  Result Date: 04/18/2020 CLINICAL DATA:  Dizziness EXAM: CT HEAD WITHOUT CONTRAST TECHNIQUE: Contiguous axial images were obtained from the base of the skull through the vertex without intravenous contrast. COMPARISON:  01/10/2018 FINDINGS: Brain: No acute intracranial abnormality. Specifically, no hemorrhage, hydrocephalus, mass lesion, acute infarction, or significant intracranial injury. Vascular: No hyperdense vessel or unexpected calcification. Skull: No acute calvarial abnormality. Sinuses/Orbits: Visualized paranasal sinuses and mastoids clear. Orbital soft tissues unremarkable. Other: None IMPRESSION: Normal study. Electronically Signed   By: Charlett Nose M.D.   On: 04/18/2020 22:53   DG Chest Portable 1 View  Result Date: 04/18/2020 CLINICAL DATA:  Chest pain EXAM: PORTABLE CHEST 1 VIEW COMPARISON:  01/10/2018 FINDINGS: The heart size and mediastinal contours are within normal limits. Both lungs are clear. The visualized skeletal structures  are unremarkable. IMPRESSION: Normal study. Electronically Signed   By: Charlett Nose M.D.   On: 04/18/2020 22:08    Procedures Procedures (including critical care time)  Medications Ordered in ED Medications  famotidine (PEPCID) IVPB 20 mg premix ( Intravenous Stopped 04/18/20 2241)  alum & mag hydroxide-simeth (MAALOX/MYLANTA) 200-200-20 MG/5ML suspension 30 mL (30 mLs Oral Given 04/18/20 2213)    And  lidocaine (XYLOCAINE) 2 % viscous mouth solution 15 mL (15 mLs Oral Given 04/18/20 2213)    ED Course  I have reviewed the triage vital signs and the nursing notes.  Pertinent labs & imaging results that were available during my care of the patient were reviewed by me and considered in my medical decision making (see chart for details).    MDM Rules/Calculators/A&P                         Felicia Robinsons Jesus Talani Brazee is a 54 y.o. female here with dizziness and chest pain.  Likely side effect of Pfizer vaccine versus dehydration.  Patient chest pain has been going on for several days so troponin x1 sufficient.  Patient also ate some food and told me that this may be gastritis.  Plan to check CBC and BMP and troponin x1 and CT head and chest x-ray.  11:46 PM Labs and CT head unremarkable.  Felt better after GI cocktail.  Stable for discharge.   Final Clinical Impression(s) / ED Diagnoses Final diagnoses:  Chest pain, unspecified type  Dizziness    Rx / DC Orders ED Discharge Orders    None       Charlynne Pander, MD 04/18/20 2346

## 2020-04-18 NOTE — Discharge Instructions (Addendum)
You likely have side effect of the vaccine   Stay hydrated   You may have reflux as well   Take pepcid as needed for reflux   Return to ER if you have worse chest pain, trouble breathing, dizziness

## 2020-04-18 NOTE — ED Triage Notes (Signed)
Pt from home via EMS. Family states weakness for 3 days after covid booster shot. Pt with chest pain and rapid RR at home. Rates pain 9/10. Daughter at bedside interpretting. VSS> afebrile

## 2020-07-10 ENCOUNTER — Ambulatory Visit: Payer: Self-pay | Admitting: Internal Medicine

## 2020-08-28 ENCOUNTER — Telehealth: Payer: Self-pay | Admitting: Internal Medicine

## 2020-08-28 NOTE — Telephone Encounter (Signed)
Patient called asking for an appointment. Patient stated that she is been having pain on the upper side of her left breast for the past 10/12 days. The symptoms stated one morning when she felt her breast was swollen and started to have some pain. Patient took one pill of naproxen 500 mg and the inflammation went away but the pain remained. Patient continued to have pain and when she is in the shower her nipple hurts when it comes in contact with the water. Please advise.

## 2020-09-05 NOTE — Telephone Encounter (Signed)
Acute appt Have her take the Naproxen twice daily with meal until then

## 2020-09-17 NOTE — Telephone Encounter (Signed)
Spoke with patient and notified her Doctor recommendations. Patient is on the waiting list in case of cancellation.

## 2020-12-04 ENCOUNTER — Encounter: Payer: Self-pay | Admitting: Internal Medicine

## 2020-12-04 ENCOUNTER — Other Ambulatory Visit: Payer: Self-pay

## 2020-12-04 ENCOUNTER — Ambulatory Visit: Payer: Self-pay | Admitting: Internal Medicine

## 2020-12-04 VITALS — BP 92/60 | HR 60 | Resp 20 | Ht 59.5 in | Wt 162.0 lb

## 2020-12-04 DIAGNOSIS — R002 Palpitations: Secondary | ICD-10-CM | POA: Insufficient documentation

## 2020-12-04 DIAGNOSIS — E782 Mixed hyperlipidemia: Secondary | ICD-10-CM

## 2020-12-04 DIAGNOSIS — R0789 Other chest pain: Secondary | ICD-10-CM | POA: Insufficient documentation

## 2020-12-04 DIAGNOSIS — N644 Mastodynia: Secondary | ICD-10-CM

## 2020-12-04 NOTE — Progress Notes (Signed)
Subjective:    Patient ID: Felicia Ray, female   DOB: [birth year], 55 y.o.   MRN: [id number]   HPI  Here after 1.5 year hiatus.    Daughter, age 75, interprets  Her bones hurt since April.  Points mainly to her anterior/superior ribs bilaterally.  Comes and goes. Occurs generally 2-3 times weekly.  Lasts a few hours to a whole day.  Has taken 400 mg of ibuprofen which helps for a couple of hours.   Describes it feels like she broke a bone and when she touches her chest, it hurts.  She also has a pain in upper left chest at times that radiates up and over the left breast and feels like a needled being stabbed into her left breast. At times, pain is so severe, she feels she will pass out.   She cannot recall an injury or over use of the muscles there that would have caused the pain. 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   No cough, though at times with throat raspiness with sense of posterior pharyngeal drainage.   No dyspnea. Can be at rest or when she is physically active--just not associated with anything in particular. Last 30 days, has noted extreme fatigue.   No melena or hematochezia.  No period for years.   During exam, noted to have intermitten premature beat every 3rd beat or a pause after every 2 beats--not clear if just with respiration.  With questioning, patient admits feels her heart beats too slow when she is having increased chest pain and this is also what makes her feel like she will pass out.    Current Meds  Medication Sig   Cholecalciferol (VITAMIN D3) 50 MCG (2000 UT) TABS Take by mouth. 1 daily   cyanocobalamin 2000 MCG tablet Take 2,000 mcg by mouth daily.   Allergies  Allergen Reactions   Aspirin Anaphylaxis    "Almost died after she was given this- felt like a heart attack"   Dipyrone Anaphylaxis and Hives    "Almost died after she was given this- felt like a heart attack" (also)   Aspirin Other (See  Comments)    Cant breath   Dipyrone Hives   Other     metamizole - blindness, n/v (an international drug)     Review of Systems    Objective:   BP 92/60 (BP Location: Right Arm, Patient Position: Sitting, Cuff Size: Normal)   Pulse 60   Resp 20   Ht 4' 11.5" (1.511 m)   Wt 162 lb (73.5 kg)   BMI 32.17 kg/m   Physical Exam NAD HEENT:  PERRL, EOMI, TMs pearly gray.  Mild cobbling of posterior pharynx. Neck:  supple, No adenopathy, no thyromegaly Chest:  CTA No focal mass of breasts--not clear the soft tissue of breasts is tender--seems much more tender when palpating ribs Very tender over rib cage bilaterally, anterolaterally--more pronounced on left.  Reproduces the chest pain of which she complains.   No tenderness with compression of rib cage from lateral compression bilaterally.  NT over posterior ribs or spinous processes. CV:  Regularly irregular rate--trigeminy.  At times with 2 beats and a pause as well.  Not clear whether related to respiration or not.  When lies supine, RRR.  Normal S1 andS2, No S3, S4 or murmur/rub.  No carotid bruits.  Carotid, radial and DP pulses normal and equal Abd:  S,  NT, No HSM or mass. + BS. LE:  varicosities, but no edema.  ECG:  NSR with premature widened QRS complex every 3rd beat.  Not clear if premature beat is ventricular or SV with aberrant conduction.  Assessment & Plan    Chest pain:  This appears to be mainly musculoskeletal.  Check Rib films, CMP, CBC, TSH nonfasting lipids. Consider bone scan if not able to clarify why she has such diffuse skeletal pain.       Cannot say if breast tissue not tender for certain--mammogram.  2.  Trigeminy and possibly bradycardia at times with presyncope:  referral to Cardiology--?event monitor and stress testing.  Labs as above.  Was previously on Atorvastatin.  Not clear why no longer taking.

## 2020-12-04 NOTE — Patient Instructions (Signed)
Ibuprofen 200 mg 3-4 tabs twice daily with food for chest wall pain

## 2020-12-05 ENCOUNTER — Other Ambulatory Visit: Payer: Self-pay

## 2020-12-05 DIAGNOSIS — N644 Mastodynia: Secondary | ICD-10-CM

## 2020-12-05 LAB — COMPREHENSIVE METABOLIC PANEL
ALT: 15 IU/L (ref 0–32)
AST: 21 IU/L (ref 0–40)
Albumin/Globulin Ratio: 1.9 (ref 1.2–2.2)
Albumin: 4.3 g/dL (ref 3.8–4.9)
Alkaline Phosphatase: 100 IU/L (ref 44–121)
BUN/Creatinine Ratio: 17 (ref 9–23)
BUN: 14 mg/dL (ref 6–24)
Bilirubin Total: <0.2 mg/dL (ref 0.0–1.2)
CO2: 24 mmol/L (ref 20–29)
Calcium: 9.5 mg/dL (ref 8.7–10.2)
Chloride: 103 mmol/L (ref 96–106)
Creatinine, Ser: 0.83 mg/dL (ref 0.57–1.00)
Globulin, Total: 2.3 g/dL (ref 1.5–4.5)
Glucose: 81 mg/dL (ref 65–99)
Potassium: 4.3 mmol/L (ref 3.5–5.2)
Sodium: 141 mmol/L (ref 134–144)
Total Protein: 6.6 g/dL (ref 6.0–8.5)
eGFR: 84 mL/min/{1.73_m2} (ref >59)

## 2020-12-05 LAB — CBC WITH DIFFERENTIAL/PLATELET
Basophils Absolute: 0 10*3/uL (ref 0.0–0.2)
Basos: 0 %
EOS (ABSOLUTE): 0.2 10*3/uL (ref 0.0–0.4)
Eos: 3 %
Hematocrit: 43 % (ref 34.0–46.6)
Hemoglobin: 14.1 g/dL (ref 11.1–15.9)
Immature Grans (Abs): 0 10*3/uL (ref 0.0–0.1)
Immature Granulocytes: 0 %
Lymphocytes Absolute: 1.6 10*3/uL (ref 0.7–3.1)
Lymphs: 27 %
MCH: 30.3 pg (ref 26.6–33.0)
MCHC: 32.8 g/dL (ref 31.5–35.7)
MCV: 93 fL (ref 79–97)
Monocytes Absolute: 0.4 10*3/uL (ref 0.1–0.9)
Monocytes: 7 %
Neutrophils Absolute: 3.7 10*3/uL (ref 1.4–7.0)
Neutrophils: 63 %
Platelets: 233 10*3/uL (ref 150–450)
RBC: 4.65 x10E6/uL (ref 3.77–5.28)
RDW: 13.6 % (ref 11.7–15.4)
WBC: 5.9 10*3/uL (ref 3.4–10.8)

## 2020-12-05 LAB — LIPID PANEL W/O CHOL/HDL RATIO
Cholesterol, Total: 273 mg/dL — ABNORMAL HIGH (ref 100–199)
HDL: 37 mg/dL — ABNORMAL LOW (ref >39)
LDL Chol Calc (NIH): 129 mg/dL — ABNORMAL HIGH (ref 0–99)
Triglycerides: 581 mg/dL (ref 0–149)
VLDL Cholesterol Cal: 107 mg/dL — ABNORMAL HIGH (ref 5–40)

## 2020-12-05 LAB — TSH: TSH: 2.78 u[IU]/mL (ref 0.450–4.500)

## 2020-12-24 ENCOUNTER — Ambulatory Visit
Admission: RE | Admit: 2020-12-24 | Discharge: 2020-12-24 | Disposition: A | Discharge status: Home / Self Care | Payer: No Typology Code available for payment source | Source: Ambulatory Visit | Attending: Obstetrics and Gynecology | Admitting: Obstetrics and Gynecology

## 2020-12-24 ENCOUNTER — Other Ambulatory Visit: Payer: Self-pay

## 2020-12-24 ENCOUNTER — Ambulatory Visit: Payer: Self-pay | Admitting: *Deleted

## 2020-12-24 VITALS — BP 98/64 | Wt 163.9 lb

## 2020-12-24 DIAGNOSIS — Z01419 Encounter for gynecological examination (general) (routine) without abnormal findings: Secondary | ICD-10-CM

## 2020-12-24 DIAGNOSIS — N644 Mastodynia: Secondary | ICD-10-CM

## 2020-12-24 NOTE — Patient Instructions (Signed)
Explained breast self awareness with Felicia Ray Nelma Rothman. Pap smear completed today. Let her know [facility] will cover Pap smears and HPV typing every 5 years unless has a history of abnormal Pap smears. Referred [facility] to [facility] for a diagnostic mammogram. Appointment scheduled Tuesday, December 24, 2020 at 1520. Patient aware of appointment and will be there. Let patient know will follow up with her within the next couple weeks with results of Pap smear by phone. Felicia Ray Zettie Cooley Zoma verbalized understanding.  Kyrie Fludd, Kathaleen Maser, RN 1:43 PM

## 2020-12-24 NOTE — Progress Notes (Signed)
Felicia Ray is a 55 y.o. No obstetric history on file. female who presents to [facility] clinic today with complaint of left outer breast pain x 4 months that comes and goes. Patient rates the pain at a 8 out of 10.    Pap Smear: Pap smear completed today. Last Pap smear was 07/06/2017 at [facility] clinic and was normal. Per patient has no history of an abnormal Pap smear. Patient stated she has a history of a colposcopy in 2000 in [place] that was negative due to she has a family history of her maternal grandmother, 3 aunts, and 2-4 cousins that had cervical cancer. Last Pap smear result is available in Epic.   Physical exam: Breasts Breasts symmetrical. No skin abnormalities bilateral breasts. No nipple retraction bilateral breasts. No nipple discharge bilateral breasts. No lymphadenopathy. No lumps palpated bilateral breasts. Complaints of left outer breast pain on exam.  MS DIGITAL SCREENING BILATERAL  Result Date: 03/25/2017 CLINICAL DATA:  Screening. EXAM: DIGITAL SCREENING BILATERAL MAMMOGRAM WITH CAD COMPARISON:  Previous exam(s). ACR Breast Density Category b: There are scattered areas of fibroglandular density. FINDINGS: There are no findings suspicious for malignancy. Images were processed with CAD. IMPRESSION: No mammographic evidence of malignancy. A result letter of this screening mammogram will be mailed directly to the patient. RECOMMENDATION: Screening mammogram in one year. (Code:SM-B-01Y) BI-RADS CATEGORY  1: Negative. Electronically Signed   By: Britta Mccreedy M.D.   On: 03/25/2017 08:59   MS DIGITAL SCREENING TOMO BILATERAL  Result Date: 09/04/2019 CLINICAL DATA:  Screening. EXAM: DIGITAL SCREENING BILATERAL MAMMOGRAM WITH TOMO AND CAD COMPARISON:  Previous exam(s). ACR Breast Density Category b: There are scattered areas of fibroglandular density. FINDINGS: There are no findings suspicious for malignancy. Images were processed with CAD. IMPRESSION: No mammographic  evidence of malignancy. A result letter of this screening mammogram will be mailed directly to the patient. RECOMMENDATION: Screening mammogram in one year. (Code:SM-B-01Y) BI-RADS CATEGORY  1: Negative. Electronically Signed   By: Sande Brothers M.D.   On: 09/04/2019 16:22         Pelvic/Bimanual Ext Genitalia No lesions, no swelling and no discharge observed on external genitalia.        Vagina Vagina pink and normal texture. No lesions or discharge observed in vagina.        Cervix Cervix is present. Cervix pink and of normal texture. No discharge observed.    Uterus Uterus is present and palpable. Uterus in normal position and normal size.        Adnexae Bilateral ovaries present and palpable. No tenderness on palpation.         Rectovaginal No rectal exam completed today since patient had no rectal complaints. No skin abnormalities observed on exam.     Smoking History: Patient has never smoked.   Patient Navigation: Patient education provided. Access to services provided for patient through [place] program. Spanish interpreter Natale Lay from [facility] provided.   Colorectal Cancer Screening: Per patient has never had colonoscopy completed. No complaints today.    Breast and Cervical Cancer Risk Assessment: Patient does not have family history of breast cancer, known genetic mutations, or radiation treatment to the chest before age 48. Patient does not have history of cervical dysplasia, immunocompromised, or DES exposure in-utero.  Risk Assessment     Risk Scores       12/24/2020   Last edited by: Narda Rutherford, LPN   5-year risk: 0.7 %   Lifetime risk:  5.4 %            A: [facility] exam with pap smear Complaint of left outer breast pain.  P: Referred patient to the [facility] for a diagnostic mammogram. Appointment scheduled Tuesday, December 24, 2020 at 1520.  Priscille Heidelberg, RN 12/24/2020 1:42 PM

## 2020-12-30 ENCOUNTER — Telehealth: Payer: Self-pay

## 2020-12-30 LAB — CYTOLOGY - PAP
Comment: NEGATIVE
Diagnosis: NEGATIVE
High risk HPV: NEGATIVE

## 2020-12-30 NOTE — Telephone Encounter (Signed)
Via Natale Lay, Spanish Interpreter, Haroldine Laws), Patient informed negative Pap/HPV results, due in 5 years.

## 2021-01-23 ENCOUNTER — Telehealth: Payer: Self-pay | Admitting: Internal Medicine

## 2021-01-23 DIAGNOSIS — R002 Palpitations: Secondary | ICD-10-CM

## 2021-01-23 DIAGNOSIS — R008 Other abnormalities of heart beat: Secondary | ICD-10-CM

## 2021-01-23 NOTE — Telephone Encounter (Signed)
Please notify the referral has been sent.  Hopefully will hear soon.  Apologize for delay.

## 2021-01-23 NOTE — Telephone Encounter (Signed)
Pt called asking about her referral to a Cardiologist but I do not see a referral. Pt states she has chest pain that gets worse when she lifts anything heavy. It is an all day and constant pain. This has been going on for six days and she is currently taking Advil for the pain. Pt is also stated some concerns about reoccurring hiccups multiple times a day.

## 2021-02-10 ENCOUNTER — Other Ambulatory Visit: Payer: Self-pay | Admitting: Internal Medicine

## 2021-02-18 ENCOUNTER — Encounter (HOSPITAL_COMMUNITY): Payer: Self-pay | Admitting: Emergency Medicine

## 2021-02-18 ENCOUNTER — Emergency Department (HOSPITAL_COMMUNITY): Payer: No Typology Code available for payment source

## 2021-02-18 ENCOUNTER — Emergency Department (HOSPITAL_COMMUNITY)
Admission: EM | Admit: 2021-02-18 | Discharge: 2021-02-18 | Disposition: A | Discharge status: Home / Self Care | Payer: No Typology Code available for payment source | Attending: Emergency Medicine | Admitting: Emergency Medicine

## 2021-02-18 DIAGNOSIS — X500XXA Overexertion from strenuous movement or load, initial encounter: Secondary | ICD-10-CM | POA: Insufficient documentation

## 2021-02-18 DIAGNOSIS — M62830 Muscle spasm of back: Secondary | ICD-10-CM | POA: Insufficient documentation

## 2021-02-18 DIAGNOSIS — M546 Pain in thoracic spine: Secondary | ICD-10-CM

## 2021-02-18 DIAGNOSIS — M79602 Pain in left arm: Secondary | ICD-10-CM | POA: Insufficient documentation

## 2021-02-18 DIAGNOSIS — J45909 Unspecified asthma, uncomplicated: Secondary | ICD-10-CM | POA: Insufficient documentation

## 2021-02-18 DIAGNOSIS — Z8616 Personal history of COVID-19: Secondary | ICD-10-CM | POA: Insufficient documentation

## 2021-02-18 DIAGNOSIS — R079 Chest pain, unspecified: Secondary | ICD-10-CM | POA: Insufficient documentation

## 2021-02-18 LAB — BASIC METABOLIC PANEL
Anion gap: 8 (ref 5–15)
BUN: 14 mg/dL (ref 6–20)
CO2: 25 mmol/L (ref 22–32)
Calcium: 9.3 mg/dL (ref 8.9–10.3)
Chloride: 105 mmol/L (ref 98–111)
Creatinine, Ser: 0.75 mg/dL (ref 0.44–1.00)
GFR, Estimated: >60 mL/min (ref >60)
Glucose, Bld: 73 mg/dL (ref 70–99)
Potassium: 3.8 mmol/L (ref 3.5–5.1)
Sodium: 138 mmol/L (ref 135–145)

## 2021-02-18 LAB — CBC
HCT: 43 % (ref 36.0–46.0)
Hemoglobin: 14.1 g/dL (ref 12.0–15.0)
MCH: 29.9 pg (ref 26.0–34.0)
MCHC: 32.8 g/dL (ref 30.0–36.0)
MCV: 91.1 fL (ref 80.0–100.0)
Platelets: 239 10*3/uL (ref 150–400)
RBC: 4.72 MIL/uL (ref 3.87–5.11)
RDW: 13.1 % (ref 11.5–15.5)
WBC: 5.3 10*3/uL (ref 4.0–10.5)
nRBC: 0 % (ref 0.0–0.2)

## 2021-02-18 LAB — TROPONIN I (HIGH SENSITIVITY)
Troponin I (High Sensitivity): 3 ng/L (ref <18)
Troponin I (High Sensitivity): <2 ng/L (ref <18)

## 2021-02-18 MED ORDER — DICLOFENAC SODIUM 1 % EX GEL: 4.0000 g | Freq: Four times a day (QID) | CUTANEOUS | 0 refills | Status: DC

## 2021-02-18 MED ORDER — ACETAMINOPHEN 325 MG PO TABS
650.0000 mg | ORAL_TABLET | Freq: Once | ORAL | Status: AC
  Administered 2021-02-18: 650 mg via ORAL
  Filled 2021-02-18: qty 2

## 2021-02-18 MED ORDER — LIDOCAINE 5 % EX PTCH
2.0000 | MEDICATED_PATCH | CUTANEOUS | Status: DC
  Administered 2021-02-18: 2 via TRANSDERMAL
  Filled 2021-02-18: qty 2

## 2021-02-18 MED ORDER — OXYCODONE-ACETAMINOPHEN 5-325 MG PO TABS
1.0000 | ORAL_TABLET | Freq: Once | ORAL | Status: AC
  Administered 2021-02-18: 1 via ORAL
  Filled 2021-02-18: qty 1

## 2021-02-18 MED ORDER — ACETAMINOPHEN 500 MG PO TABS: 1000.0000 mg | ORAL_TABLET | Freq: Four times a day (QID) | ORAL | 0 refills | Status: DC | PRN

## 2021-02-18 MED ORDER — METHOCARBAMOL 500 MG PO TABS: 500.0000 mg | ORAL_TABLET | Freq: Two times a day (BID) | ORAL | 0 refills | Status: DC

## 2021-02-18 NOTE — Discharge Instructions (Addendum)
Your examination today is most concerning for a muscular injury 1. Medications: alternate ibuprofen and tylenol for pain control, take all usual home medications as they are prescribed 2. Treatment: rest, ice, elevate. Also drink plenty of fluids and do plenty of gentle stretching and move the affected muscle through its normal range of motion to prevent stiffness. 3. Follow Up: If your symptoms do not improve please follow up with orthopedics/sports medicine or your PCP for discussion of your diagnoses and further evaluation after today's visit; if you do not have a primary care doctor use the resource guide provided to find one; Please return to the ER for worsening symptoms or other concerns.    Please use Tylenol or ibuprofen for pain.  You may use 600 mg ibuprofen every 6 hours or 1000 mg of Tylenol every 6 hours.  You may choose to alternate between the 2.  This would be most effective.  Not to exceed 4 g of Tylenol within 24 hours.  Not to exceed 3200 mg ibuprofen 24 hours.   I have also prescribed a muscle relaxer called Robaxin please take this as needed for pain as prescribed.  Medication such as Voltaren gel or IcyHot can be very helpful as well.  I recommend applying Voltaren gel directly to the area of pain.  You may do this 4 times daily for discomfort.     Su examen de hoy es ms preocupante por una lesin muscular 1. Medicamentos: alterne ibuprofeno y tylenol para Human resources officer, tome todos los medicamentos habituales en el hogar tal como se los recetan 2. Tratamiento: reposo, hielo, elevacin. Tambin beba muchos lquidos y haga muchos estiramientos suaves y [place] el msculo afectado a travs de su rango normal de movimiento para Transport planner rigidez. 3. Seguimiento: si sus sntomas no mejoran, haga un seguimiento con ortopedia/medicina deportiva o su PCP para analizar sus diagnsticos y [place] evaluacin adicional despus de la visita de hoy; si no tiene un mdico de atencin  primaria, use la gua de recursos provista para Dealer; Regrese a la sala de emergencias si los sntomas empeoran u otras inquietudes.   Utilice Tylenol o ibuprofeno para el dolor. Puede usar 600 mg de ibuprofeno cada 6 horas o 1000 mg de Tylenol cada 6 horas. Puede optar por [employer] 2. Esto sera ms efectivo. No exceder los 4 g de Tylenol en 24 horas. No exceder los 3200 mg de ibuprofeno 24 horas.  Tambin le he recetado un relajante muscular llamado Robaxin. Tmelo segn lo necesite para el dolor segn lo prescrito. Los medicamentos como Voltaren gel o IcyHot tambin pueden ser [employer]. Recomiendo aplicar Voltaren gel directamente en el rea del dolor. Puede hacer esto 4 veces al da si siente molestias.  Regrese a la sala de emergencias por cualquier sntoma nuevo o preocupante, como dolor en el pecho/vmitos o cualquier otro sntoma preocupante.

## 2021-02-18 NOTE — ED Provider Notes (Signed)
[facility] EMERGENCY DEPARTMENT Provider Note   CSN: [id number] Arrival date & time: 02/18/21  7494     History Chief Complaint  Patient presents with   Chest Pain    Felicia Ray is a 55 y.o. female.  HPI Patient is a 55 year old Spanish-speaking female.  Spanish language interpreter was used for the entirety of this visit. Patient has a past medical history significant for chronic pain syndrome/neuropathic pain followed by Dr. Cassandria Santee.  Also has history of HLD, high cholesterol.   Patient is followed by [facility] Manson [facility]  Patient states that she woke up this morning around 6 or 7 AM and noticed that when she moves around she was having severe left-sided back pain.  She states that it is achy constant worse with moving specifically bending over or lifting things or moving things with her left arm.  She denies any chest pain currently but states that she does occasionally experience chest pain.  She states that she has no nausea vomiting shortness of breath lightheadedness or dizziness.  No new workouts heavy lifting or trauma.  No recent car accidents.  She states that when she takes a very deep breath her back hurts as well.  She denies any pleuritic or exertional chest pain.  Denies any chest pain or abdominal.  No hemoptysis.  No recent travel or immobilization no recent hospital stays.  No recent surgeries, hospitalization, long travel, hemoptysis, estrogen containing OCP, cancer history.  No unilateral leg swelling.  No history of PE or VTE.      Past Medical History:  Diagnosis Date   Asthma    Chronic pain    Chronic pain syndrome 01/23/2014   Fall by slipping on water at work.  Worker's Comp case.  Followed by Dr. Corine Shelter,  and [facility].  Waiting for second opinion.  Not clear why left low back and knee are not improving.  Called neuropathic pain by Dr. Ethelene Hal.   Environmental and seasonal  allergies    HLD (hyperlipidemia)    Hypercholesteremia     Patient Active Problem List   Diagnosis Date Noted   Chest wall pain 12/04/2020   Palpitations 12/04/2020   Breast pain 12/04/2020   Migraine headache 01/03/2020   Burn 01/03/2020   Environmental and seasonal allergies    Pneumonia due to COVID-19 virus 01/12/2019   Asthma 01/12/2019   HLD (hyperlipidemia) 01/12/2019   Class 2 severe obesity with serious comorbidity and body mass index (BMI) of 35.0 to 35.9 in adult [facility]) 12/08/2018   Environmental allergies 08/04/2018   Hypercholesteremia    Long term use of drug 06/12/2016   Onychomycosis of toenail 05/01/2016   Chronic pain 06/26/2015   Decreased visual acuity 06/26/2015   Mixed hyperlipidemia 06/26/2015   Depression 06/26/2015    Past Surgical History:  Procedure Laterality Date   TUBAL LIGATION  07/20/2005   TUBAL LIGATION       OB History   No obstetric history on file.     Family History  Problem Relation Age of Onset   Hypertension Mother    Asthma Mother    Hyperlipidemia Mother    Obesity Mother    Heart disease Mother        CHF by description   Diabetes Sister    Hypertension Sister    Hyperlipidemia Sister    Hypertension Brother    Hyperlipidemia Brother    Cervical cancer Maternal Grandmother  Social History   Tobacco Use   Smoking status: Never   Smokeless tobacco: Never  Vaping Use   Vaping Use: Never used  Substance Use Topics   Alcohol use: Never   Drug use: Never    Home Medications Prior to Admission medications   Medication Sig Start Date End Date Taking? Authorizing Provider  acetaminophen (TYLENOL) 500 MG tablet Take 2 tablets (1,000 mg total) by mouth every 6 (six) hours as needed for moderate pain or mild pain. 02/18/21  Yes Paola Flynt S, PA  diclofenac Sodium (VOLTAREN) 1 % GEL Apply 4 g topically 4 (four) times daily. 02/18/21  Yes Lateya Dauria S, PA  ibuprofen (ADVIL) 200 MG tablet Take 400 mg by mouth  every 6 (six) hours as needed for mild pain.   Yes [provider]  methocarbamol (ROBAXIN) 500 MG tablet Take 1 tablet (500 mg total) by mouth 2 (two) times daily. 02/18/21  Yes Aarti Mankowski S, PA  Omega-3 Fatty Acids (FISH OIL PO) Take 1 capsule by mouth daily.   Yes [provider]  albuterol (VENTOLIN HFA) 108 (90 Base) MCG/ACT inhaler Inhale 4 puffs into the lungs every 2 (two) hours as needed for wheezing or shortness of breath. Patient not taking: Reported on 07/12/2019 01/15/19 07/26/19  Rhetta MuraSamtani, Jai-Gurmukh, MD  cetirizine (ZYRTEC) 10 MG tablet Take 1 tablet (10 mg total) by mouth daily. Patient not taking: Reported on 02/09/2019 08/04/18 07/26/19  Julieanne MansonMulberry, Elizabeth, MD    Allergies    Aspirin, Dipyrone, Aspirin, Dipyrone, and Other  Review of Systems   Review of Systems  Constitutional:  Negative for chills and fever.  HENT:  Negative for congestion.   Eyes:  Negative for pain.  Respiratory:  Negative for cough and shortness of breath.   Cardiovascular:  Negative for chest pain and leg swelling.  Gastrointestinal:  Negative for abdominal pain and vomiting.  Genitourinary:  Negative for dysuria.  Musculoskeletal:  Positive for back pain and myalgias.       Left arm pain / aches  Skin:  Negative for rash.  Neurological:  Negative for dizziness and headaches.   Physical Exam Updated Vital Signs BP 96/67   Pulse (!) 50   Temp 98 F (36.7 C)   Resp 15   Ht 4\' 11"  (1.499 m)   Wt 74.3 kg   SpO2 98%   BMI 33.08 kg/m   Physical Exam Vitals and nursing note reviewed.  Constitutional:      General: She is not in acute distress.    Comments: Pleasant well-appearing 55 year old.  In no acute distress.  Sitting comfortably in bed.  Able answer questions appropriately follow commands. No increased work of breathing. Speaking in full sentences.   HENT:     Head: Normocephalic and atraumatic.     Nose: Nose normal.  Eyes:     General: No scleral  icterus. Cardiovascular:     Rate and Rhythm: Normal rate and regular rhythm.     Pulses: Normal pulses.     Heart sounds: Normal heart sounds.  Pulmonary:     Effort: Pulmonary effort is normal. No respiratory distress.     Breath sounds: No wheezing.     Comments: Lungs are clear to auscultation all fields.  No tachypnea.  Speaking in full sentences. Abdominal:     Palpations: Abdomen is soft.     Tenderness: There is no abdominal tenderness.  Musculoskeletal:       Arms:     Cervical back:  Normal range of motion.     Right lower leg: No edema.     Left lower leg: No edema.     Comments: No lower extremity edema.  No calf tenderness.  There is reproducible tenderness palpation of the left trapezius and the scapular region of the paravertebral muscles on the left side with trigger point.  This reproduces the patient's discomfort.  There is a trigger point in the left trapezius as well close to the base of the neck.  Skin:    General: Skin is warm and dry.     Capillary Refill: Capillary refill takes less than 2 seconds.  Neurological:     Mental Status: She is alert. Mental status is at baseline.  Psychiatric:        Mood and Affect: Mood normal.        Behavior: Behavior normal.    ED Results / Procedures / Treatments   Labs (all labs ordered are listed, but only abnormal results are displayed) Labs Reviewed  BASIC METABOLIC PANEL  CBC  TROPONIN I (HIGH SENSITIVITY)  TROPONIN I (HIGH SENSITIVITY)    EKG None  Radiology DG Chest 2 View  Result Date: 02/18/2021 CLINICAL DATA:  Chest pain EXAM: CHEST - 2 VIEW COMPARISON:  Chest radiograph 04/18/2020 FINDINGS: The cardiomediastinal silhouette is normal. There is slight asymmetric elevation of the right hemidiaphragm. There is no focal consolidation or pulmonary edema. There is no pleural effusion or pneumothorax. There is no acute osseous abnormality. IMPRESSION: No radiographic evidence of acute cardiopulmonary process.  Electronically Signed   By: Lesia Hausen M.D.   On: 02/18/2021 08:47    Procedures Procedures   Medications Ordered in ED Medications  lidocaine (LIDODERM) 5 % 2 patch (2 patches Transdermal Patch Applied 02/18/21 1018)  oxyCODONE-acetaminophen (PERCOCET/ROXICET) 5-325 MG per tablet 1 tablet (1 tablet Oral Given 02/18/21 1018)  acetaminophen (TYLENOL) tablet 650 mg (650 mg Oral Given 02/18/21 1018)    ED Course  I have reviewed the triage vital signs and the nursing notes.  Pertinent labs & imaging results that were available during my care of the patient were reviewed by me and considered in my medical decision making (see chart for details).    MDM Rules/Calculators/A&P                           Patient is Spanish-speaking female presented today with severe back pain.  She states that it is in her mid back between her scapula and her spine.  She states it is achy constant worse with movement but now worse with walking and does not cause any shortness of breath.  She states it does feel somewhat uncomfortable if she takes a deep breath.  Denies any hemoptysis chest pain fevers chills cough or congestion.  No trauma to her chest or back. On physical exam she has reproducible muscular tenderness palpation of the back.  Is left-sided.  No midline tenderness.    No symptoms of cauda equina specifically no leg weakness saddle anesthesia and no history of IV drug use making spinal dural abscess less likely.  She is afebrile with normal vital signs overall well-appearing.  Blood pressure on the low side which is not significant for her.  Patient did have chest pain work-up today with negative delta troponins also normal BMP, CBC chest x-ray was unremarkable and EKG without any evidence of ischemia.  Given Lidoderm patch Tylenol and 1 Percocet states that her pain  is much improved.  Will discharge home with Robaxin Voltaren gel and Tylenol with recommendations to use Voltaren gel 4 times daily.   Strict return precautions were given.  Final Clinical Impression(s) / ED Diagnoses Final diagnoses:  Acute left-sided thoracic back pain  Muscle spasm of back    Rx / DC Orders ED Discharge Orders          Ordered    diclofenac Sodium (VOLTAREN) 1 % GEL  4 times daily        02/18/21 0939    acetaminophen (TYLENOL) 500 MG tablet  Every 6 hours PRN        02/18/21 0939    methocarbamol (ROBAXIN) 500 MG tablet  2 times daily        02/18/21 0939             Gailen Shelter, PA 02/18/21 1626    Mancel Bale, MD 02/18/21 1756

## 2021-02-18 NOTE — ED Triage Notes (Signed)
Patient complains of sudden onset of left back and left arm pain that started today. No emesis, no shortness of breath.

## 2021-03-18 ENCOUNTER — Encounter: Payer: Self-pay | Admitting: Cardiovascular Disease

## 2021-03-18 ENCOUNTER — Other Ambulatory Visit: Payer: Self-pay

## 2021-03-18 ENCOUNTER — Encounter: Payer: Self-pay | Admitting: Radiology

## 2021-03-18 ENCOUNTER — Ambulatory Visit: Payer: Self-pay | Admitting: Cardiovascular Disease

## 2021-03-18 VITALS — BP 112/68 | HR 95 | Ht 59.0 in | Wt 165.6 lb

## 2021-03-18 DIAGNOSIS — I951 Orthostatic hypotension: Secondary | ICD-10-CM

## 2021-03-18 DIAGNOSIS — R002 Palpitations: Secondary | ICD-10-CM

## 2021-03-18 NOTE — Patient Instructions (Addendum)
Medication Instructions:  Your physician recommends that you continue on your current medications as directed. Please refer to the Current Medication list given to you today.  *If you need a refill on your cardiac medications before your next appointment, please call your pharmacy*   Lab Work: None Ordered If you have labs (blood work) drawn today and your tests are completely normal, you will receive your results only by: MyChart Message (if you have MyChart) OR A paper copy in the mail If you have any lab test that is abnormal or we need to change your treatment, we will call you to review the results.   Testing/Procedures: Your physician has requested that you have an echocardiogram. Echocardiography is a painless test that uses sound waves to create images of your heart. It provides your doctor with information about the size and shape of your heart and how well your heart's chambers and valves are working. This procedure takes approximately one hour. There are no restrictions for this procedure.  Your physician has recommended that you wear an event monitor. Event monitors are medical devices that record the heart's electrical activity. Doctors most often [place] these monitors to diagnose arrhythmias. Arrhythmias are problems with the speed or rhythm of the heartbeat. The monitor is a small, portable device. You can wear one while you do your normal daily activities. This is usually used to diagnose what is causing palpitations/syncope (passing out).  [employer] Cardiac Event Monitor Instructions Your physician has requested you wear your cardiac event monitor for _____ days, (1-30). [employer] may call or text to confirm a shipping address. The monitor will be sent to a land address via UPS. [employer] will not ship a monitor to a PO BOX. It typically takes 3-5 days to receive your monitor after it has been enrolled. [employer] will assist with USPS tracking if your package is delayed. The  telephone number for [employer] is [phone]. Once you have received your monitor, please review the enclosed instructions. Instruction tutorials can also be viewed under help and settings on the enclosed cell phone. Your monitor has already been registered assigning a specific monitor serial # to you.  Applying the monitor Remove cell phone from case and turn it on. The cell phone works as IT consultant and needs to be within [employer] of you at all times. The cell phone will need to be charged on a daily basis. We recommend you plug the cell phone into the enclosed charger at your bedside table every night.  Monitor batteries: You will receive two monitor batteries labelled #1 and #2. These are your recorders. Plug battery #2 onto the second connection on the enclosed charger. Keep one battery on the charger at all times. This will keep the monitor battery deactivated. It will also keep it fully charged for when you need to switch your monitor batteries. A small light will be blinking on the battery emblem when it is charging. The light on the battery emblem will remain on when the battery is fully charged.  Open package of a Monitor strip. Insert battery #1 into black hood on strip and gently squeeze monitor battery onto connection as indicated in instruction booklet. Set aside while preparing skin.  Choose location for your strip, vertical or horizontal, as indicated in the instruction booklet. Shave to remove all hair from location. There cannot be any lotions, oils, powders, or colognes on skin where monitor is to be applied. Wipe skin clean with enclosed Saline wipe. Dry skin completely.  Peel paper labeled #1 off the back of the Monitor strip exposing the adhesive. Place the monitor on the chest in the vertical or horizontal position shown in the instruction booklet. One arrow on the monitor strip must be pointing upward. Carefully remove paper labeled #2, attaching remainder  of strip to your skin. Try not to create any folds or wrinkles in the strip as you apply it.  Firmly press and release the circle in the center of the monitor battery. You will hear a small beep. This is turning the monitor battery on. The heart emblem on the monitor battery will light up every 5 seconds if the monitor battery in turned on and connected to the patient securely. Do not push and hold the circle down as this turns the monitor battery off. The cell phone will locate the monitor battery. A screen will appear on the cell phone checking the connection of your monitor strip. This may read poor connection initially but change to good connection within the next minute. Once your monitor accepts the connection you will hear a series of 3 beeps followed by a climbing crescendo of beeps. A screen will appear on the cell phone showing the two monitor strip placement options. Touch the picture that demonstrates where you applied the monitor strip.  Your monitor strip and battery are waterproof. You are able to shower, bathe, or swim with the monitor on. They just ask you do not submerge deeper than 3 feet underwater. We recommend removing the monitor if you are swimming in a lake, river, or ocean.  Your monitor battery will need to be switched to a fully charged monitor battery approximately once a week. The cell phone will alert you of an action which needs to be made.  On the cell phone, tap for details to reveal connection status, monitor battery status, and cell phone battery status. The green dots indicates your monitor is in good status. A red dot indicates there is something that needs your attention.  To record a symptom, click the circle on the monitor battery. In 30-60 seconds a list of symptoms will appear on the cell phone. Select your symptom and tap save. Your monitor will record a sustained or significant arrhythmia regardless of you clicking the button. Some patients do not  feel the heart rhythm irregularities. [employer] will notify us of any serious or critical events.  Refer to instruction booklet for instructions on switching batteries, changing strips, the Do not disturb or Pause features, or any additional questions.  Call [employer] at [phone], to confirm your monitor is transmitting and record your baseline. They will answer any questions you may have regarding the monitor instructions at that time.  Returning the monitor to [employer] Place all equipment back into blue box. Peel off strip of paper to expose adhesive and close box securely. There is a prepaid UPS shipping label on this box. Drop in a UPS drop box, or at a UPS facility like [employer]. You may also contact [employer] to arrange UPS to pick up monitor package at your home.    Follow-Up: At [facility], you and your health needs are our priority.  As part of our continuing mission to provide you with exceptional heart care, we have created designated Provider Care Teams.  These Care Teams include your primary Cardiologist (physician) and Advanced Practice Providers (APPs -  Physician Assistants and Nurse Practitioners) who all work together to provide you with the care you need, when you need it.  Your next appointment:   3 month(s)  The format for your next appointment:   In Person  Provider:   You may see Kristeen Miss, MD or one of the following Advanced Practice Providers on your designated Care Team:   Tereso Newcomer, PA-C [place], [place]

## 2021-03-18 NOTE — Progress Notes (Signed)
Enrolled patient for a 30 day [employer] Event Monitor to be mailed to patients home. Spanish instructions were requested 

## 2021-03-18 NOTE — Progress Notes (Signed)
Cardiology Office Note:    Date:  03/18/2021   ID:  Felicia Ray, DOB Feb 04, 1966, MRN [id number]  PCP:  Julieanne Manson, MD   [facility] Providers Cardiologist:   Eugenia Pancoast to update primary MD,subspecialty MD or APP then REFRESH:1}    Referring MD: Julieanne Manson, MD   Chief Complaint  Patient presents with   Palpitations     History of Present Illness:    Felicia Ray is a 55 y.o. female with a hx of  palpitations.    We were asked to see her for further evaluation of her palpitations by Dr. Delrae Alfred.  Seen with Doran Stabler, Interpreter.  Has been having palpitations for the past 7 months . Does not exercise  Palps occur all times of day  She has had several episodes of presyncopes with these palpitations.  Palps seem to improve with several deep breaths.  Has astham, uses her albuterol inhaler only very rarely  TSH in June, 2022 was normal .  Was in the ER in early Sept with chest pain and dizziness.  Works at a Publishing rights manager    Past Medical History:  Diagnosis Date   Asthma    Chest pain    Chronic pain    Chronic pain syndrome 01/23/2014   Fall by slipping on water at work.  Worker's Comp case.  Followed by Dr. Corine Shelter,  and [facility].  Waiting for second opinion.  Not clear why left low back and knee are not improving.  Called neuropathic pain by Dr. Ethelene Hal.   Environmental and seasonal allergies    HLD (hyperlipidemia)    Hypercholesteremia     Past Surgical History:  Procedure Laterality Date   TUBAL LIGATION  07/20/2005   TUBAL LIGATION      Current Medications: Current Meds  Medication Sig   ibuprofen (ADVIL) 200 MG tablet Take 400 mg by mouth every 6 (six) hours as needed for mild pain.   Omega-3 Fatty Acids (FISH OIL PO) Take 1 capsule by mouth daily.     Allergies:   Aspirin, Dipyrone, Aspirin, Dipyrone, and Other   Social History   Socioeconomic History   Marital status: Single    Spouse  name: Not on file   Number of children: 2   Years of education: Not on file   Highest education level: 12th grade  Occupational History   Occupation: Works at [employer]  Tobacco Use   Smoking status: Never   Smokeless tobacco: Never  Vaping Use   Vaping Use: Never used  Substance and Sexual Activity   Alcohol use: Never   Drug use: Never   Sexual activity: Not Currently    Birth control/protection: Surgical  Other Topics Concern   Not on file  Social History Narrative      Originally from [place]   [place]   Divorced husband in [place] due to his alcohol abuse.    He did not abuse her.   In 10 year relationship with current boyfriend.  They really are separated, but continue to live in the same home.   LIves at home    with boyfriend and 65 yo daughter   Social Determinants of Corporate investment banker Strain: Not on file  Food Insecurity: No Food Insecurity   Worried About Programme researcher, broadcasting/film/video in the Last Year: Never true   Barista in the Last Year: Never true  Transportation  Needs: No Transportation Needs   Lack of Transportation (Medical): No   Lack of Transportation (Non-Medical): No  Physical Activity: Not on file  Stress: Not on file  Social Connections: Not on file     Family History: The patient's family history includes Asthma in her mother; Cervical cancer in her maternal grandmother; Diabetes in her sister; Heart disease in her mother; Hyperlipidemia in her brother, mother, and sister; Hypertension in her brother, mother, and sister; Obesity in her mother.  ROS:   Please see the history of present illness.     All other systems reviewed and are negative.  EKGs/Labs/Other Studies Reviewed:    The following studies were reviewed today:   EKG:  Nov. 4, 2021:  NSR with frequent  PVCs  ECG from ER 2 weeks ago.  NSR with no PVCs  Recent Labs: 12/04/2020: ALT 15; TSH 2.780 02/18/2021: BUN 14; Creatinine, Ser 0.75; Hemoglobin 14.1;  Platelets 239; Potassium 3.8; Sodium 138  Recent Lipid Panel    Component Value Date/Time   CHOL 273 (H) 12/04/2020 1623   TRIG 581 (HH) 12/04/2020 1623   HDL 37 (L) 12/04/2020 1623   CHOLHDL 8.3 01/13/2019 0334   VLDL UNABLE TO CALCULATE IF TRIGLYCERIDE OVER 400 mg/dL 89/16/9450 3888   LDLCALC 129 (H) 12/04/2020 1623   LDLDIRECT 82.5 01/13/2019 0334     Risk Assessment/Calculations:           Physical Exam:    VS:  BP 112/68   Pulse 95   Ht 4\' 11"  (1.499 m)   Wt 165 lb 9.6 oz (75.1 kg)   SpO2 98%   BMI 33.45 kg/m     Wt Readings from Last 3 Encounters:  03/18/21 165 lb 9.6 oz (75.1 kg)  02/18/21 163 lb 12.8 oz (74.3 kg)  12/24/20 163 lb 14.4 oz (74.3 kg)     GEN:  moderately obese , well developed in no acute distress HEENT: Normal NECK: No JVD; No carotid bruits LYMPHATICS: No lymphadenopathy CARDIAC: RR,  frequent PVCs on exam RESPIRATORY:  Clear to auscultation without rales, wheezing or rhonchi  ABDOMEN: Soft, non-tender, non-distended MUSCULOSKELETAL:  No edema; No deformity  SKIN: Warm and dry NEUROLOGIC:  Alert and oriented x 3 PSYCHIATRIC:  Normal affect   ASSESSMENT:    1. Palpitations   2. Orthostatic hypotension    PLAN:    In order of problems listed above:  Palpitations: She has symptoms of palpitations fairly frequently.  She has lots of PVCs on exam today but cannot feel them.  It is possible that she is having another type of arrhythmia to explain her palpitations.  She also seems to have some presyncope associated with these palpitations.  We will place a 30-day event monitor for further evaluation.  Her TSH was checked in June, 2022 and it was normal.  2.  Presyncope/orthostatic hypotension.  Her blood pressure is low at baseline.  She was lightheaded when I sat her up on the exam table.  It is quite possible that she is having orthostatic hypotension as part of her symptom complex.  I have encouraged her to drink a V8 juice every day.  She  makes a green juice smoothie every morning.  Have asked her to add a little bit of sodium chloride and potassium chloride to her morning juice smoothie. We will get an echocardiogram for further evaluation of her LV function.   Shared Decision Making/Informed Consent{  Medication Adjustments/Labs and Tests Ordered: Current medicines are reviewed at  length with the patient today.  Concerns regarding medicines are outlined above.  Orders Placed This Encounter  Procedures   Cardiac event monitor   ECHOCARDIOGRAM COMPLETE   No orders of the defined types were placed in this encounter.   Patient Instructions  Medication Instructions:  Your physician recommends that you continue on your current medications as directed. Please refer to the Current Medication list given to you today.  *If you need a refill on your cardiac medications before your next appointment, please call your pharmacy*   Lab Work: None Ordered If you have labs (blood work) drawn today and your tests are completely normal, you will receive your results only by: MyChart Message (if you have MyChart) OR A paper copy in the mail If you have any lab test that is abnormal or we need to change your treatment, we will call you to review the results.   Testing/Procedures: Your physician has requested that you have an echocardiogram. Echocardiography is a painless test that uses sound waves to create images of your heart. It provides your doctor with information about the size and shape of your heart and how well your heart's chambers and valves are working. This procedure takes approximately one hour. There are no restrictions for this procedure.  Your physician has recommended that you wear an event monitor. Event monitors are medical devices that record the heart's electrical activity. Doctors most often [place] these monitors to diagnose arrhythmias. Arrhythmias are problems with the speed or rhythm of the heartbeat. The monitor  is a small, portable device. You can wear one while you do your normal daily activities. This is usually used to diagnose what is causing palpitations/syncope (passing out).  [employer] Cardiac Event Monitor Instructions Your physician has requested you wear your cardiac event monitor for _____ days, (1-30). [employer] may call or text to confirm a shipping address. The monitor will be sent to a land address via UPS. [employer] will not ship a monitor to a PO BOX. It typically takes 3-5 days to receive your monitor after it has been enrolled. [employer] will assist with USPS tracking if your package is delayed. The telephone number for [employer] is [phone]. Once you have received your monitor, please review the enclosed instructions. Instruction tutorials can also be viewed under help and settings on the enclosed cell phone. Your monitor has already been registered assigning a specific monitor serial # to you.  Applying the monitor Remove cell phone from case and turn it on. The cell phone works as IT consultant and needs to be within [employer] of you at all times. The cell phone will need to be charged on a daily basis. We recommend you plug the cell phone into the enclosed charger at your bedside table every night.  Monitor batteries: You will receive two monitor batteries labelled #1 and #2. These are your recorders. Plug battery #2 onto the second connection on the enclosed charger. Keep one battery on the charger at all times. This will keep the monitor battery deactivated. It will also keep it fully charged for when you need to switch your monitor batteries. A small light will be blinking on the battery emblem when it is charging. The light on the battery emblem will remain on when the battery is fully charged.  Open package of a Monitor strip. Insert battery #1 into black hood on strip and gently squeeze monitor battery onto connection as indicated in instruction booklet. Set  aside while preparing skin.  Choose location  for your strip, vertical or horizontal, as indicated in the instruction booklet. Shave to remove all hair from location. There cannot be any lotions, oils, powders, or colognes on skin where monitor is to be applied. Wipe skin clean with enclosed Saline wipe. Dry skin completely.  Peel paper labeled #1 off the back of the Monitor strip exposing the adhesive. Place the monitor on the chest in the vertical or horizontal position shown in the instruction booklet. One arrow on the monitor strip must be pointing upward. Carefully remove paper labeled #2, attaching remainder of strip to your skin. Try not to create any folds or wrinkles in the strip as you apply it.  Firmly press and release the circle in the center of the monitor battery. You will hear a small beep. This is turning the monitor battery on. The heart emblem on the monitor battery will light up every 5 seconds if the monitor battery in turned on and connected to the patient securely. Do not push and hold the circle down as this turns the monitor battery off. The cell phone will locate the monitor battery. A screen will appear on the cell phone checking the connection of your monitor strip. This may read poor connection initially but change to good connection within the next minute. Once your monitor accepts the connection you will hear a series of 3 beeps followed by a climbing crescendo of beeps. A screen will appear on the cell phone showing the two monitor strip placement options. Touch the picture that demonstrates where you applied the monitor strip.  Your monitor strip and battery are waterproof. You are able to shower, bathe, or swim with the monitor on. They just ask you do not submerge deeper than 3 feet underwater. We recommend removing the monitor if you are swimming in a lake, river, or ocean.  Your monitor battery will need to be switched to a fully charged monitor battery  approximately once a week. The cell phone will alert you of an action which needs to be made.  On the cell phone, tap for details to reveal connection status, monitor battery status, and cell phone battery status. The green dots indicates your monitor is in good status. A red dot indicates there is something that needs your attention.  To record a symptom, click the circle on the monitor battery. In 30-60 seconds a list of symptoms will appear on the cell phone. Select your symptom and tap save. Your monitor will record a sustained or significant arrhythmia regardless of you clicking the button. Some patients do not feel the heart rhythm irregularities. [employer] will notify us of any serious or critical events.  Refer to instruction booklet for instructions on switching batteries, changing strips, the Do not disturb or Pause features, or any additional questions.  Call [employer] at [phone], to confirm your monitor is transmitting and record your baseline. They will answer any questions you may have regarding the monitor instructions at that time.  Returning the monitor to [employer] Place all equipment back into blue box. Peel off strip of paper to expose adhesive and close box securely. There is a prepaid UPS shipping label on this box. Drop in a UPS drop box, or at a UPS facility like [employer]. You may also contact [employer] to arrange UPS to pick up monitor package at your home.    Follow-Up: At [facility], you and your health needs are our priority.  As part of our continuing mission to provide you with exceptional heart  care, we have created designated Provider Care Teams.  These Care Teams include your primary Cardiologist (physician) and Advanced Practice Providers (APPs -  Physician Assistants and Nurse Practitioners) who all work together to provide you with the care you need, when you need it.  We recommend signing up for the patient portal called "MyChart".  Sign  up information is provided on this After Visit Summary.  MyChart is used to connect with patients for Virtual Visits (Telemedicine).  Patients are able to view lab/test results, encounter notes, upcoming appointments, etc.  Non-urgent messages can be sent to your provider as well.   To learn more about what you can do with MyChart, go to [email or web address].    Your next appointment:   3 month(s)  The format for your next appointment:   In Person  Provider:   You may see Kristeen Miss, MD or one of the following Advanced Practice Providers on your designated Care Team:   Tereso Newcomer, PA-C [place], [place]    Signed, Kristeen Miss, MD  03/18/2021 9:00 AM    [facility]

## 2021-03-26 ENCOUNTER — Encounter: Payer: Self-pay | Admitting: Cardiovascular Disease

## 2021-03-26 ENCOUNTER — Ambulatory Visit (INDEPENDENT_AMBULATORY_CARE_PROVIDER_SITE_OTHER): Payer: Self-pay

## 2021-03-26 DIAGNOSIS — R002 Palpitations: Secondary | ICD-10-CM

## 2021-03-26 DIAGNOSIS — I951 Orthostatic hypotension: Secondary | ICD-10-CM

## 2021-03-31 ENCOUNTER — Telehealth: Payer: Self-pay | Admitting: *Deleted
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
## 2021-04-03 ENCOUNTER — Ambulatory Visit (HOSPITAL_COMMUNITY): Payer: Self-pay | Attending: Internal Medicine

## 2021-04-03 ENCOUNTER — Other Ambulatory Visit: Payer: Self-pay

## 2021-04-03 DIAGNOSIS — I951 Orthostatic hypotension: Secondary | ICD-10-CM

## 2021-04-03 DIAGNOSIS — Q2112 Patent foramen ovale: Secondary | ICD-10-CM

## 2021-04-03 DIAGNOSIS — R002 Palpitations: Secondary | ICD-10-CM

## 2021-04-03 HISTORY — DX: Patent foramen ovale: Q21.12

## 2021-04-03 LAB — ECHOCARDIOGRAM COMPLETE
Area-P 1/2: 3.42 cm2
S' Lateral: 2.7 cm

## 2021-04-04 ENCOUNTER — Telehealth: Payer: Self-pay

## 2021-04-04 NOTE — Telephone Encounter (Signed)
Pt is returning call to [facility]

## 2021-04-04 NOTE — Telephone Encounter (Signed)
-----   Message from Vesta Mixer, MD sent at 04/04/2021 11:18 AM EDT ----- Normal LV function . No significant valvular disease  Possible small patent foramen ovale.  These is actually very common and usually benign.   She has not had any stroke symptoms .  She is wearing an event monitor also

## 2021-04-04 NOTE — Telephone Encounter (Signed)
[employer] [phone] who attempted phone call to pt and left voicemail message to contact office at [phone].

## 2021-04-04 NOTE — Telephone Encounter (Signed)
Secured Interpreter, [place], #[id number], and advised of echo results per Dr Elease Hashimoto:  Normal LV function . No significant valvular disease  Possible small patent foramen ovale.  These is actually very common and usually benign.    Pt verbalizes understanding and thanked Charity fundraiser for the call.

## 2021-04-07 ENCOUNTER — Other Ambulatory Visit: Payer: Self-pay

## 2021-04-07 DIAGNOSIS — E782 Mixed hyperlipidemia: Secondary | ICD-10-CM

## 2021-04-08 LAB — LIPID PANEL W/O CHOL/HDL RATIO
Cholesterol, Total: 225 mg/dL — ABNORMAL HIGH (ref 100–199)
HDL: 33 mg/dL — ABNORMAL LOW (ref >39)
LDL Chol Calc (NIH): 144 mg/dL — ABNORMAL HIGH (ref 0–99)
Triglycerides: 262 mg/dL — ABNORMAL HIGH (ref 0–149)
VLDL Cholesterol Cal: 48 mg/dL — ABNORMAL HIGH (ref 5–40)

## 2021-04-14 ENCOUNTER — Ambulatory Visit: Payer: No Typology Code available for payment source | Admitting: Cardiovascular Disease

## 2021-04-30 ENCOUNTER — Other Ambulatory Visit: Payer: Self-pay

## 2021-04-30 ENCOUNTER — Ambulatory Visit (INDEPENDENT_AMBULATORY_CARE_PROVIDER_SITE_OTHER): Payer: Self-pay | Admitting: Cardiovascular Disease

## 2021-04-30 ENCOUNTER — Encounter: Payer: Self-pay | Admitting: Cardiovascular Disease

## 2021-04-30 DIAGNOSIS — I493 Ventricular premature depolarization: Secondary | ICD-10-CM | POA: Insufficient documentation

## 2021-04-30 MED ORDER — METOPROLOL SUCCINATE ER 25 MG PO TB24: 25.0000 mg | ORAL_TABLET | Freq: Every day | ORAL | 11 refills | Status: DC
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
## 2021-04-30 NOTE — Patient Instructions (Signed)
Medication Instructions: METOPROLOL SUCC 25 MG EVERY DAY  *If you need a refill on your cardiac medications before your next appointment, please call your pharmacy*   Lab Work: NONE If you have labs (blood work) drawn today and your tests are completely normal, you will receive your results only by: MyChart Message (if you have MyChart) OR A paper copy in the mail If you have any lab test that is abnormal or we need to change your treatment, we will call you to review the results.   Testing/Procedures: NONE   Follow-Up: At [facility], you and your health needs are our priority.  As part of our continuing mission to provide you with exceptional heart care, we have created designated Provider Care Teams.  These Care Teams include your primary Cardiologist (physician) and Advanced Practice Providers (APPs -  Physician Assistants and Nurse Practitioners) who all work together to provide you with the care you need, when you need it.  We recommend signing up for the patient portal called "MyChart".  Sign up information is provided on this After Visit Summary.  MyChart is used to connect with patients for Virtual Visits (Telemedicine).  Patients are able to view lab/test results, encounter notes, upcoming appointments, etc.  Non-urgent messages can be sent to your provider as well.   To learn more about what you can do with MyChart, go to [email or web address].    Your next appointment:   3 month(s)  The format for your next appointment:   In Person  Provider:   Kristeen Miss, MD  or Tereso Newcomer, PA-C        Other Instructions NONE

## 2021-05-01 ENCOUNTER — Telehealth: Payer: Self-pay | Admitting: Cardiovascular Disease

## 2021-05-01 NOTE — Telephone Encounter (Signed)
Outreach made to [employer].  No option to speak to a representative.  Dr. Elease Hashimoto recently prescribed metoprolol succinate for Pt.    So message was probably-they do not have succinate can Pt take tartrate?  But left message requesting call back to confirm.  Will check with Dr. Elease Hashimoto if it is ok for Pt to take tartrate if [facility] pharmacy does not have succinate.  Await call back.

## 2021-05-01 NOTE — Telephone Encounter (Signed)
Call back received from [facility] with [facility].  Confirmed they do NOT carry metoprolol succinate.  They DO carry metoprolol tartrate.  Requesting Pt prescription be changed.

## 2021-05-01 NOTE — Telephone Encounter (Signed)
Pt c/o medication issue:  1. Name of Medication:  metoprolol succinate (TOPROL XL) 25 MG 24 hr tablet  2. How are you currently taking this medication (dosage and times per day)? N/A  3. Are you having a reaction (difficulty breathing--STAT)? N/A  4. What is your medication issue? Pharmacy is calling stating they do not have tartrate. Wanting to know if they can prescribed metoprolol instead.

## 2021-05-05 MED ORDER — METOPROLOL TARTRATE 25 MG PO TABS: 12.5000 mg | ORAL_TABLET | Freq: Two times a day (BID) | ORAL | 3 refills | Status: AC

## 2021-05-05 NOTE — Telephone Encounter (Signed)
Discontinued metoprolol succinate d/t pharmacy not carrying med changed to metoprolol tartrate 12.5 mg PO BID per MD.

## 2021-05-05 NOTE — Telephone Encounter (Signed)
Called pharmacy to recommend giving pt a pill splitter.  Pharmacy does not carry splitter and will verbally advise and place a note on med bag for recommendation of pill splitter.

## 2021-06-22 ENCOUNTER — Encounter: Payer: Self-pay | Admitting: Cardiovascular Disease

## 2021-06-22 NOTE — Progress Notes (Signed)
Cardiology Office Note:    Date:  06/23/2021   ID:  Felicia Charon Jesus Athleen Hugunin, DOB 12-Jan-1966, MRN [id number]  PCP:  Mack Hook, MD   [facility] Cardiologist:   Chrishun Scheer     Referring MD: Mack Hook, MD   Chief Complaint  Patient presents with   Palpitations     History of Present Illness:    Felicia Ray is a 56 y.o. female with a hx of  palpitations.    We were asked to see her for further evaluation of her palpitations by Dr. Amil Amen.  Seen with Felicia Ray, Interpreter.  Has been having palpitations for the past 7 months . Does not exercise  Palps occur all times of day  She has had several episodes of presyncopes with these palpitations.  Palps seem to improve with several deep breaths.  Has astham, uses her albuterol inhaler only very rarely  TSH in June, 2022 was normal .  Was in the ER in early Sept with chest pain and dizziness.  Works at a cleaners   April 30, 2021: Felicia Ray is seen today for follow-up of her palpitations.  An event monitor revealed frequent premature ventricular contractions. Echocardiogram from October 20 reveals normal left ventricular systolic function.  We started Metoprolol 25 BID She did not start it due to worrying about having a low BP  Will try toprol XL instead   Jan. 9, 2023 Felicia Ray is seen for follow up of her palpitations .  Seen with interpreter  Felicia Ray   She is feeling better after starting V8 We have started Toprol XL 25 mg a day  A little exercise - due to cold  She cannot feel her PVCs at baseline She has palpitations with walking  - does very little exercise  When I examined her today she was in ventricular bigeminy.  She was completely asymptomatic.  I had her do a 1 minute step test.  She increased her heart rate appropriately.  She did become short of breath and could then feel some palpitations.  After exercise her PVCs were markedly diminished but they returned  as her heart rate recovered.  I discussed the case with Dr. Caryl Comes.  We discussed possibly starting flecainide.  He thought that perhaps the best option would be continue with observation since her left ventricular systolic function is normal and she is asymptomatic.  Her PVC morphology appears that it is coming from intramyocardial and would be somewhat difficult to ablate.  We will to repeat her echocardiogram in 6 months and I will see her several weeks after that for follow-up visit.  Past Medical History:  Diagnosis Date   Asthma    Chest pain    Chronic pain    Chronic pain syndrome 01/23/2014   Fall by slipping on water at work.  Worker's Comp case.  Followed by Dr. Eugenie Filler,  and [facility].  Waiting for second opinion.  Not clear why left low back and knee are not improving.  Called neuropathic pain by Dr. Nelva Bush.   Environmental and seasonal allergies    HLD (hyperlipidemia)    Hypercholesteremia     Past Surgical History:  Procedure Laterality Date   TUBAL LIGATION  07/20/2005   TUBAL LIGATION      Current Medications: Current Meds  Medication Sig   acetaminophen (TYLENOL) 500 MG tablet Take 2 tablets (1,000 mg total) by mouth every 6 (six) hours as needed for moderate pain or mild  pain.   albuterol (VENTOLIN HFA) 108 (90 Base) MCG/ACT inhaler Inhale 2 puffs into the lungs every 6 (six) hours as needed for wheezing or shortness of breath.   ibuprofen (ADVIL) 200 MG tablet Take 400 mg by mouth every 6 (six) hours as needed for mild pain.   metoprolol tartrate (LOPRESSOR) 25 MG tablet Take 0.5 tablets (12.5 mg total) by mouth 2 (two) times daily.   Omega-3 Fatty Acids (FISH OIL PO) Take 1 capsule by mouth daily.     Allergies:   Aspirin, Dipyrone, Aspirin, Dipyrone, and Other   Social History   Socioeconomic History   Marital status: Single    Spouse name: Not on file   Number of children: 2   Years of education: Not on file   Highest education level: 12th grade   Occupational History   Occupation: Works at [employer]   Smoking status: Never   Smokeless tobacco: Never  Vaping Use   Vaping Use: Never used  Substance and Sexual Activity   Alcohol use: Never   Drug use: Never   Sexual activity: Not Currently    Birth control/protection: Surgical  Other Topics Concern   Not on file  Social History Narrative      Originally from [place]   Moved to [place] in 2004   Divorced husband in [place] due to his alcohol abuse.    He did not abuse her.   In 10 year relationship with current boyfriend.  They really are separated, but continue to live in the same home.   LIves at home    with boyfriend and 33 yo daughter   Social Determinants of Radio broadcast assistant Strain: Not on file  Food Insecurity: No Food Insecurity   Worried About Charity fundraiser in the Last Year: Never true   Arboriculturist in the Last Year: Never true  Transportation Needs: No Transportation Needs   Lack of Transportation (Medical): No   Lack of Transportation (Non-Medical): No  Physical Activity: Not on file  Stress: Not on file  Social Connections: Not on file     Family History: The patient's family history includes Asthma in her mother; Cervical cancer in her maternal grandmother; Diabetes in her sister; Heart disease in her mother; Hyperlipidemia in her brother, mother, and sister; Hypertension in her brother, mother, and sister; Obesity in her mother.  ROS:   Please see the history of present illness.     All other systems reviewed and are negative.  EKGs/Labs/Other Studies Reviewed:    The following studies were reviewed today:   EKG:     Recent Labs: 12/04/2020: ALT 15; TSH 2.780 02/18/2021: BUN 14; Creatinine, Ser 0.75; Hemoglobin 14.1; Platelets 239; Potassium 3.8; Sodium 138  Recent Lipid Panel    Component Value Date/Time   CHOL 225 (H) 04/07/2021 0932   TRIG 262 (H) 04/07/2021 0932   HDL 33 (L) 04/07/2021 0932   CHOLHDL 8.3  01/13/2019 0334   VLDL UNABLE TO CALCULATE IF TRIGLYCERIDE OVER 400 mg/dL 01/13/2019 0334   LDLCALC 144 (H) 04/07/2021 0932   LDLDIRECT 82.5 01/13/2019 0334     Risk Assessment/Calculations:           Physical Exam:    Physical Exam: Blood pressure 100/62, pulse (!) 47, height 4\' 11"  (1.499 m), weight 169 lb 9.6 oz (76.9 kg), SpO2 99 %.  GEN:  Well nourished, well developed in no acute distress HEENT: Normal NECK: No JVD; No  carotid bruits LYMPHATICS: No lymphadenopathy CARDIAC: RRR , no murmurs, rubs, gallops RESPIRATORY:  Clear to auscultation without rales, wheezing or rhonchi  ABDOMEN: Soft, non-tender, non-distended MUSCULOSKELETAL:  No edema; No deformity  SKIN: Warm and dry NEUROLOGIC:  Alert and oriented x 3   ASSESSMENT:    1. PVC's (premature ventricular contractions)   2. Palpitations   3. Mediastinal mass   4. Medication management     PLAN:       Palpitations:   PVCs.   When I examined her today she was in ventricular bigeminy.  She was completely asymptomatic.  I had her do a 1 minute step test.  She increased her heart rate appropriately.  She did become short of breath and could then feel some palpitations.  After exercise her PVCs were markedly diminished but they returned as her heart rate recovered.  I discussed the case with Dr. Caryl Comes.  We discussed possibly starting flecainide.  He thought that perhaps the best option would be continue with observation since her left ventricular systolic function is normal and she is asymptomatic.  Her PVC morphology appears that it is coming from intramyocardial and would be somewhat difficult to ablate.  We will to repeat her echocardiogram in 6 months and I will see her several weeks after that for follow-up visit. We will check a TSH and basic metabolic profile today-to make sure her potassium level is within normal limits.   2.  Presyncope/orthostatic hypotension.   She is feeling much better with hydration,  liquid IV, V8 juice.  3.  Obesity: I have asked her to limit her carbohydrates.  She will continue to work on her diet and work on exercise.  Shared Decision Making/Informed Consent{  Medication Adjustments/Labs and Tests Ordered: Current medicines are reviewed at length with the patient today.  Concerns regarding medicines are outlined above.  Orders Placed This Encounter  Procedures   Basic Metabolic Panel (BMET)   TSH   ECHOCARDIOGRAM COMPLETE    Meds ordered this encounter  Medications   albuterol (VENTOLIN HFA) 108 (90 Base) MCG/ACT inhaler    Sig: Inhale 2 puffs into the lungs every 6 (six) hours as needed for wheezing or shortness of breath.    Dispense:  8 g    Refill:  2     Patient Instructions  Medication Instructions:  Albuterol Inhaler 2 puffs as needed for wheezing *If you need a refill on your cardiac medications before your next appointment, please call your pharmacy*   Lab Work: BMET and TSH If you have labs (blood work) drawn today and your tests are completely normal, you will receive your results only by: [place] (if you have MyChart) OR A paper copy in the mail If you have any lab test that is abnormal or we need to change your treatment, we will call you to review the results.   Testing/Procedures: in 6 months Your physician has requested that you have an echocardiogram. Echocardiography is a painless test that uses sound waves to create images of your heart. It provides your doctor with information about the size and shape of your heart and how well your hearts chambers and valves are working. This procedure takes approximately one hour. There are no restrictions for this procedure.    Follow-Up: At [facility], you and your health needs are our priority.  As part of our continuing mission to provide you with exceptional heart care, we have created designated Provider Care Teams.  These Care Teams  include your primary Cardiologist  (physician) and Advanced Practice Providers (APPs -  Physician Assistants and Nurse Practitioners) who all work together to provide you with the care you need, when you need it.  We recommend signing up for the patient portal called "MyChart".  Sign up information is provided on this After Visit Summary.  MyChart is used to connect with patients for Virtual Visits (Telemedicine).  Patients are able to view lab/test results, encounter notes, upcoming appointments, etc.  Non-urgent messages can be sent to your provider as well.   To learn more about what you can do with MyChart, go to [email or web address].    Your next appointment:   6 month(s)  The format for your next appointment:   In Person  Provider:   Mertie Moores, MD     Other Instructions     Signed, Mertie Moores, MD  06/23/2021 8:49 AM    [place]

## 2021-06-23 ENCOUNTER — Other Ambulatory Visit: Payer: Self-pay

## 2021-06-23 ENCOUNTER — Encounter: Payer: Self-pay | Admitting: Cardiovascular Disease

## 2021-06-23 ENCOUNTER — Ambulatory Visit (INDEPENDENT_AMBULATORY_CARE_PROVIDER_SITE_OTHER): Payer: Self-pay | Admitting: Cardiovascular Disease

## 2021-06-23 VITALS — BP 100/62 | HR 47 | Ht 59.0 in | Wt 169.6 lb

## 2021-06-23 DIAGNOSIS — I493 Ventricular premature depolarization: Secondary | ICD-10-CM

## 2021-06-23 DIAGNOSIS — J9859 Other diseases of mediastinum, not elsewhere classified: Secondary | ICD-10-CM

## 2021-06-23 DIAGNOSIS — Z79899 Other long term (current) drug therapy: Secondary | ICD-10-CM

## 2021-06-23 DIAGNOSIS — R002 Palpitations: Secondary | ICD-10-CM

## 2021-06-23 LAB — BASIC METABOLIC PANEL
BUN/Creatinine Ratio: 15 (ref 9–23)
BUN: 12 mg/dL (ref 6–24)
CO2: 22 mmol/L (ref 20–29)
Calcium: 8.8 mg/dL (ref 8.7–10.2)
Chloride: 104 mmol/L (ref 96–106)
Creatinine, Ser: 0.78 mg/dL (ref 0.57–1.00)
Glucose: 96 mg/dL (ref 70–99)
Potassium: 4.6 mmol/L (ref 3.5–5.2)
Sodium: 138 mmol/L (ref 134–144)
eGFR: 91 mL/min/{1.73_m2} (ref >59)

## 2021-06-23 LAB — TSH: TSH: 2.96 u[IU]/mL (ref 0.450–4.500)

## 2021-06-23 MED ORDER — ALBUTEROL SULFATE HFA 108 (90 BASE) MCG/ACT IN AERS: 2.0000 | INHALATION_SPRAY | Freq: Four times a day (QID) | RESPIRATORY_TRACT | 2 refills | Status: DC | PRN

## 2021-06-23 NOTE — Patient Instructions (Addendum)
Medication Instructions:  Albuterol Inhaler 2 puffs as needed for wheezing *If you need a refill on your cardiac medications before your next appointment, please call your pharmacy*   Lab Work: BMET and TSH If you have labs (blood work) drawn today and your tests are completely normal, you will receive your results only by: MyChart Message (if you have MyChart) OR A paper copy in the mail If you have any lab test that is abnormal or we need to change your treatment, we will call you to review the results.   Testing/Procedures: in 6 months Your physician has requested that you have an echocardiogram. Echocardiography is a painless test that uses sound waves to create images of your heart. It provides your doctor with information about the size and shape of your heart and how well your hearts chambers and valves are working. This procedure takes approximately one hour. There are no restrictions for this procedure.    Follow-Up: At [facility], you and your health needs are our priority.  As part of our continuing mission to provide you with exceptional heart care, we have created designated Provider Care Teams.  These Care Teams include your primary Cardiologist (physician) and Advanced Practice Providers (APPs -  Physician Assistants and Nurse Practitioners) who all work together to provide you with the care you need, when you need it.  We recommend signing up for the patient portal called "MyChart".  Sign up information is provided on this After Visit Summary.  MyChart is used to connect with patients for Virtual Visits (Telemedicine).  Patients are able to view lab/test results, encounter notes, upcoming appointments, etc.  Non-urgent messages can be sent to your provider as well.   To learn more about what you can do with MyChart, go to [email or web address].    Your next appointment:   6 month(s)  The format for your next appointment:   In Person  Provider:   Kristeen Miss, MD      Other Instructions

## 2021-07-03 ENCOUNTER — Ambulatory Visit: Payer: Self-pay | Admitting: Internal Medicine

## 2021-07-03 ENCOUNTER — Encounter: Payer: Self-pay | Admitting: Internal Medicine

## 2021-07-03 ENCOUNTER — Other Ambulatory Visit: Payer: Self-pay

## 2021-07-03 VITALS — BP 110/86 | HR 52 | Resp 12 | Ht 60.0 in | Wt 169.0 lb

## 2021-07-03 DIAGNOSIS — J4540 Moderate persistent asthma, uncomplicated: Secondary | ICD-10-CM

## 2021-07-03 DIAGNOSIS — L309 Dermatitis, unspecified: Secondary | ICD-10-CM

## 2021-07-03 DIAGNOSIS — B354 Tinea corporis: Secondary | ICD-10-CM

## 2021-07-03 DIAGNOSIS — I493 Ventricular premature depolarization: Secondary | ICD-10-CM

## 2021-07-03 DIAGNOSIS — Z0000 Encounter for general adult medical examination without abnormal findings: Secondary | ICD-10-CM

## 2021-07-03 MED ORDER — FLUTICASONE-SALMETEROL 100-50 MCG/ACT IN AEPB: INHALATION_SPRAY | RESPIRATORY_TRACT | 11 refills | Status: DC

## 2021-07-03 MED ORDER — TRIAMCINOLONE ACETONIDE 0.1 % EX CREA: TOPICAL_CREAM | CUTANEOUS | 0 refills | Status: DC

## 2021-07-03 MED ORDER — TERBINAFINE HCL 1 % EX CREA: TOPICAL_CREAM | CUTANEOUS | 0 refills | Status: DC

## 2021-07-03 NOTE — Progress Notes (Signed)
Subjective:    Patient ID: Felicia Ray, female   DOB: 1966/02/08, 56 y.o.   MRN: [id number]   HPI  CPE without pap  1.  Pap:  Had pap smear with [facility] 12/24/2020 and normal.    2.  Mammogram:  Last performed 12/24/2020 requiring U/S and spot compression views of left breast, ultimately normal.    3.  Osteoprevention:  Drinking 2-3 servings of almond milk and cheese daily.  Not physically active.  Has been having problems with breathing when walks.  Noted that with Cardiology her HR increased appropriately and her PVCs were markedly diminished.     4.  Guaiac Cards/FIT:  Last performed 07/2019 and negative for blood.   5.  Colonoscopy:  Never.  No family history of colon cancer.  Discussed screening recommendations.    6.  Immunizations:  Has not had bivalent COVID vaccine.  Has had what she feels are significant episodes of aching, dizziness, fever after her Pfizer vaccines.  Concerned to get another COVID vaccine.  Immunization History  Administered Date(s) Administered   Influenza,inj,Quad PF,6+ Mos 03/26/2016, 04/04/2018   Influenza-Unspecified 04/14/2016, 03/30/2019, 03/17/2021   PFIZER(Purple Top)SARS-COV-2 Vaccination 08/25/2019, 09/15/2019, 04/13/2020   Pneumococcal Polysaccharide-23 01/13/2019   Tdap 02/20/2016    7.  Glucose/Cholesterol:  Glucose fine last week.  FLP, in particular trigs better in October on [employer], but still needs work and to be rechecked in Feb.  Feels she can get on a bicycle and preserve knee joint.   Lipid Panel     Component Value Date/Time   CHOL 225 (H) 04/07/2021 0932   TRIG 262 (H) 04/07/2021 0932   HDL 33 (L) 04/07/2021 0932   CHOLHDL 8.3 01/13/2019 0334   VLDL UNABLE TO CALCULATE IF TRIGLYCERIDE OVER 400 mg/dL 01/13/2019 0334   LDLCALC 144 (H) 04/07/2021 0932   LDLDIRECT 82.5 01/13/2019 0334   LABVLDL 48 (H) 04/07/2021 0932     Current Meds  Medication Sig   acetaminophen (TYLENOL) 500 MG tablet Take 2  tablets (1,000 mg total) by mouth every 6 (six) hours as needed for moderate pain or mild pain.   albuterol (VENTOLIN HFA) 108 (90 Base) MCG/ACT inhaler Inhale 2 puffs into the lungs every 6 (six) hours as needed for wheezing or shortness of breath.   ibuprofen (ADVIL) 200 MG tablet Take 400 mg by mouth every 6 (six) hours as needed for mild pain.   metoprolol tartrate (LOPRESSOR) 25 MG tablet Take 0.5 tablets (12.5 mg total) by mouth 2 (two) times daily.   Omega-3 Fatty Acids (FISH OIL PO) Take 1 capsule by mouth daily.   Allergies  Allergen Reactions   Aspirin Anaphylaxis    "Almost died after she was given this- felt like a heart attack"   Dipyrone Anaphylaxis and Hives    "Almost died after she was given this- felt like a heart attack" (also)   Dipyrone Hives   Other     metamizole - blindness, n/v (an international drug)   Past Medical History:  Diagnosis Date   Asthma    Chest pain    Chronic pain    Chronic pain syndrome 01/23/2014   Fall by slipping on water at work.  Worker's Comp case.  Followed by Dr. Eugenie Filler,  and [facility].  Waiting for second opinion.  Not clear why left low back and knee are not improving.  Called neuropathic pain by Dr. Nelva Bush.   Environmental and seasonal allergies  Frequent PVCs    HLD (hyperlipidemia)    Past Surgical History:  Procedure Laterality Date   TUBAL LIGATION  07/20/2005   Family History  Problem Relation Age of Onset   Hypertension Mother    Asthma Mother    Hyperlipidemia Mother    Obesity Mother    Heart disease Mother        CHF by description   Diabetes Sister    Hypertension Sister    Hyperlipidemia Sister    Hypertension Brother    Hyperlipidemia Brother    Cervical cancer Maternal Grandmother    Social History   Socioeconomic History   Marital status: Single    Spouse name: Not on file   Number of children: 2   Years of education: Not on file   Highest education level: 12th grade  Occupational  History   Occupation: Works at [employer]  Tobacco Use   Smoking status: Never   Smokeless tobacco: Never  Vaping Use   Vaping Use: Never used  Substance and Sexual Activity   Alcohol use: Never   Drug use: Never   Sexual activity: Not Currently    Birth control/protection: Surgical  Other Topics Concern   Not on file  Social History Narrative      Originally from [place]   [place]   Divorced husband in [place] due to his alcohol abuse.    He did not abuse her.   LIves at home with her daughter, but no longer with who was a long term boyfriend.  He is father of daughter and they maintain a good relationship.   Social Determinants of Health   Financial Resource Strain: Low Risk    Difficulty of Paying Living Expenses: Not hard at all  Food Insecurity: No Food Insecurity   Worried About Programme researcher, broadcasting/film/video in the Last Year: Never true   Ran Out of Food in the Last Year: Never true  Transportation Needs: No Transportation Needs   Lack of Transportation (Medical): No   Lack of Transportation (Non-Medical): No  Physical Activity: Not on file  Stress: Not on file  Social Connections: Not on file  Intimate Partner Violence: Not on file       Review of Systems  Respiratory:         COVID pneumonia in July of 2020 for which she was hospitalized.  Since then, she has wheezing and cough productive of light/clear yellow sputum.  When she walks or exerts herself, she develops chest tightness as she cannot get a deep breath.  Was given SAB rescue inhaler by cardiology and finds that helps with walking, but not with heavy lifting.       Objective:   BP 110/86 (BP Location: Left Arm, Patient Position: Sitting, Cuff Size: Normal)   Pulse (!) 52   Resp 12   Ht 5' (1.524 m)   Wt 169 lb (76.7 kg)   LMP  (LMP Unknown)   BMI 33.01 kg/m   Physical Exam HENT:     Head: Normocephalic and atraumatic.     Right Ear: Tympanic membrane and ear canal normal.     Left Ear:  Tympanic membrane, ear canal and external ear normal.     Nose: Nose normal.     Mouth/Throat:     Mouth: Mucous membranes are moist.     Pharynx: Oropharynx is clear.  Eyes:     Extraocular Movements: Extraocular movements intact.     Conjunctiva/sclera: Conjunctivae normal.  Pupils: Pupils are equal, round, and reactive to light.     Comments: Discs sharp  Neck:     Thyroid: No thyroid mass or thyromegaly.  Cardiovascular:     Rate and Rhythm: Normal rate and regular rhythm. Extrasystoles (occasional early beat and pause/) are present.    Heart sounds: S1 normal and S2 normal. No murmur heard.    No friction rub. No S3 or S4 sounds.     Comments: No carotid bruits.  Carotid, radial, femoral, DP and PT pulses normal and equal.   Pulmonary:     Effort: Pulmonary effort is normal.     Breath sounds: Wheezes: Lungs with mild exp wheeze bilaterally--scattered.  Chest:     Comments: Deferred-exam performed with [facility] in July Abdominal:     General: Bowel sounds are normal.     Palpations: Abdomen is soft. There is no hepatomegaly, splenomegaly or mass.     Tenderness: There is no abdominal tenderness.     Hernia: No hernia is present.  Genitourinary:    Comments: Deferred as exam performed in July with [facility] Musculoskeletal:        General: Normal range of motion.     Cervical back: Normal range of motion and neck supple.     Right lower leg: No edema.     Left lower leg: No edema.  Lymphadenopathy:     Head:     Right side of head: No submental or submandibular adenopathy.     Left side of head: No submental or submandibular adenopathy.     Cervical: No cervical adenopathy.     Upper Body:     Right upper body: No supraclavicular adenopathy.     Left upper body: No supraclavicular adenopathy.     Lower Body: No right inguinal adenopathy. No left inguinal adenopathy.  Skin:    General: Skin is warm.     Capillary Refill: Capillary refill takes less than 2  seconds.     Comments: High chest with 2 areas--the left possibly with central clearing with flaking and dryness on mildly erythematous base.  Right without clearing in center and small.    Neurological:     General: No focal deficit present.     Mental Status: She is alert and oriented to person, place, and time.     Cranial Nerves: Cranial nerves 2-12 are intact.     Sensory: Sensation is intact.     Motor: Motor function is intact.     Coordination: Coordination is intact.     Gait: Gait is intact.     Deep Tendon Reflexes: Reflexes are normal and symmetric.  Psychiatric:        Speech: Speech normal.        Behavior: Behavior normal. Behavior is cooperative.          Assessment & Plan     CPE without pap. Mammogram due in July FIT to return in 1 week.     2.  Cough and wheeze:  take your zyrtec and add Advair.  Follow up in 3 months.  3.  Chest Skin lesions:  Not clear etiology (eczema vs tinea or other).  Treat with combination Triamcinolone Terbinafine creams twice daily for 14 days.  Call if do not resolve  4.  PVCs:  cardiology.  Continue beta blocker  5.  Obesity:  encouraged small goals for lifestyle changes.

## 2021-07-05 ENCOUNTER — Emergency Department (HOSPITAL_COMMUNITY)
Admission: EM | Admit: 2021-07-05 | Discharge: 2021-07-06 | Disposition: A | Discharge status: Home / Self Care | Payer: No Typology Code available for payment source | Attending: Emergency Medicine | Admitting: Emergency Medicine

## 2021-07-05 ENCOUNTER — Other Ambulatory Visit: Payer: Self-pay

## 2021-07-05 ENCOUNTER — Emergency Department (HOSPITAL_COMMUNITY): Payer: No Typology Code available for payment source

## 2021-07-05 DIAGNOSIS — J209 Acute bronchitis, unspecified: Secondary | ICD-10-CM | POA: Insufficient documentation

## 2021-07-05 DIAGNOSIS — R051 Acute cough: Secondary | ICD-10-CM

## 2021-07-05 DIAGNOSIS — J45909 Unspecified asthma, uncomplicated: Secondary | ICD-10-CM | POA: Insufficient documentation

## 2021-07-05 DIAGNOSIS — Z20822 Contact with and (suspected) exposure to covid-19: Secondary | ICD-10-CM | POA: Insufficient documentation

## 2021-07-05 DIAGNOSIS — Z7951 Long term (current) use of inhaled steroids: Secondary | ICD-10-CM | POA: Insufficient documentation

## 2021-07-05 DIAGNOSIS — R112 Nausea with vomiting, unspecified: Secondary | ICD-10-CM | POA: Insufficient documentation

## 2021-07-05 DIAGNOSIS — R109 Unspecified abdominal pain: Secondary | ICD-10-CM | POA: Insufficient documentation

## 2021-07-05 LAB — RESP PANEL BY RT-PCR (FLU A&B, COVID) ARPGX2
Influenza A by PCR: NEGATIVE
Influenza B by PCR: NEGATIVE
SARS Coronavirus 2 by RT PCR: NEGATIVE

## 2021-07-05 MED ORDER — ALBUTEROL SULFATE HFA 108 (90 BASE) MCG/ACT IN AERS: 2.0000 | INHALATION_SPRAY | RESPIRATORY_TRACT | Status: DC | PRN

## 2021-07-05 NOTE — ED Provider Triage Note (Signed)
Emergency Medicine Provider Triage Evaluation Note  Felicia Ray , a 56 y.o. female  was evaluated in triage.  Pt complains of URI-like symptoms, going on for about 2 weeks time, has a productive cough, general body aches, states that she has intermittent fevers and chills has had nausea and vomiting diarrhea denies hematemesis, coffee-ground emesis, melena or hematochezia, she is vaccinated to COVID-19, not vaccinated against the flu, denies any recent sick contacts, no shortness of breath or chest pain still tolerating p.o..  Review of Systems  Positive: Cough, general body aches Negative: Shortness of breath, stomach pains  Physical Exam  BP 113/66 (BP Location: Right Arm)    Pulse (!) 113    Temp 99.3 F (37.4 C) (Oral)    Resp (!) 24    Ht 5' (1.524 m)    Wt 76 kg    LMP  (LMP Unknown)    SpO2 96%    BMI 32.72 kg/m  Gen:   Awake, no distress   Resp:  Normal effort  MSK:   Moves extremities without difficulty  Other:    Medical Decision Making  Medically screening exam initiated at 10:16 PM.  Appropriate orders placed.  Felicia Ray was informed that the remainder of the evaluation will be completed by another provider, this initial triage assessment does not replace that evaluation, and the importance of remaining in the ED until their evaluation is complete.  Presents with URI-like symptoms labwork and imaging have been ordered patient need further work-up.   Carroll Sage, PA-C 07/05/21 2217

## 2021-07-05 NOTE — ED Triage Notes (Signed)
Pt c/o cough and shortness of breath for the past week. Pt reports a history of asthma.

## 2021-07-06 MED ORDER — ALBUTEROL SULFATE HFA 108 (90 BASE) MCG/ACT IN AERS: 1.0000 | INHALATION_SPRAY | Freq: Four times a day (QID) | RESPIRATORY_TRACT | 1 refills | Status: DC | PRN

## 2021-07-06 MED ORDER — HYDROCODONE BIT-HOMATROP MBR 5-1.5 MG/5ML PO SOLN: 5.0000 mL | Freq: Four times a day (QID) | ORAL | 0 refills | Status: DC | PRN

## 2021-07-06 MED ORDER — PREDNISONE 20 MG PO TABS: 40.0000 mg | ORAL_TABLET | Freq: Every day | ORAL | 0 refills | Status: DC

## 2021-07-06 MED ORDER — ONDANSETRON HCL 4 MG PO TABS: 4.0000 mg | ORAL_TABLET | Freq: Four times a day (QID) | ORAL | 0 refills | Status: DC

## 2021-07-06 NOTE — ED Provider Notes (Signed)
[facility] EMERGENCY DEPARTMENT Provider Note   CSN: [id number] Arrival date & time: 07/05/21  2142     History  Chief Complaint  Patient presents with   Cough   Shortness of Breath    Felicia Ray is a 56 y.o. female with a past medical history significant for asthma, history of slow heart rate with frequent PVCs who presents with 2 weeks of cough, shortness of breath, nausea, vomiting, stomach pain.  Patient reports that her illness began around 2 weeks ago with sore throat, cough, sneezing, feeling of fever and chills, has not persisted with severe cough that comes on and fits, causes her to vomit.  Patient endorses pain in chest, pain in stomach with severe coughing.  Patient denies chest pain at rest.  Patient reports that she does not have an inhaler at this time, but has not had much wheezing overall.  Patient denies constipation, diarrhea, hematemesis, headache, dysuria, hematuria.   Cough Associated symptoms: shortness of breath   Shortness of Breath Associated symptoms: cough and vomiting       Home Medications Prior to Admission medications   Medication Sig Start Date End Date Taking? Authorizing Provider  albuterol (VENTOLIN HFA) 108 (90 Base) MCG/ACT inhaler Inhale 1-2 puffs into the lungs every 6 (six) hours as needed for wheezing or shortness of breath. 07/06/21  Yes Jadea Shiffer H, PA-C  HYDROcodone bit-homatropine (HYCODAN) 5-1.5 MG/5ML syrup Take 5 mLs by mouth every 6 (six) hours as needed for cough. 07/06/21  Yes Johnavon Mcclafferty H, PA-C  ondansetron (ZOFRAN) 4 MG tablet Take 1 tablet (4 mg total) by mouth every 6 (six) hours. 07/06/21  Yes Jebadiah Imperato H, PA-C  predniSONE (DELTASONE) 20 MG tablet Take 2 tablets (40 mg total) by mouth daily. 07/06/21  Yes Norlene Lanes H, PA-C  acetaminophen (TYLENOL) 500 MG tablet Take 2 tablets (1,000 mg total) by mouth every 6 (six) hours as needed for moderate pain or mild  pain. 02/18/21   Gailen Shelter, PA  fluticasone-salmeterol (ADVAIR) 100-50 MCG/ACT AEPB 1 inhalation twice daily 07/03/21   Julieanne Manson, MD  ibuprofen (ADVIL) 200 MG tablet Take 400 mg by mouth every 6 (six) hours as needed for mild pain.    [provider]  metoprolol tartrate (LOPRESSOR) 25 MG tablet Take 0.5 tablets (12.5 mg total) by mouth 2 (two) times daily. 05/05/21   Nahser, Deloris Ping, MD  Omega-3 Fatty Acids (FISH OIL PO) Take 1 capsule by mouth daily.    [provider]  terbinafine (LAMISIL) 1 % cream Apply to affected areas twice daily until resolves 07/03/21   Julieanne Manson, MD  triamcinolone cream (KENALOG) 0.1 % Apply to affected areas twice daily as needed for rash 07/03/21   Julieanne Manson, MD  cetirizine (ZYRTEC) 10 MG tablet Take 1 tablet (10 mg total) by mouth daily. Patient not taking: Reported on 02/09/2019 08/04/18 07/26/19  Julieanne Manson, MD      Allergies    Aspirin, Dipyrone, Dipyrone, and Other    Review of Systems   Review of Systems  Respiratory:  Positive for cough and shortness of breath.   Gastrointestinal:  Positive for nausea and vomiting.  All other systems reviewed and are negative.  Physical Exam Updated Vital Signs BP 113/67    Pulse 78    Temp 98.3 F (36.8 C) (Oral)    Resp (!) 24    Ht 5' (1.524 m)    Wt 76 kg  LMP  (LMP Unknown)    SpO2 98%    BMI 32.72 kg/m  Physical Exam Vitals and nursing note reviewed.  Constitutional:      General: She is not in acute distress.    Appearance: Normal appearance.     Comments: Somewhat ill-appearing, actively coughing throughout our interview  HENT:     Head: Normocephalic and atraumatic.  Eyes:     General:        Right eye: No discharge.        Left eye: No discharge.  Neck:     Comments: No palpable cervical adenopathy, neck is supple, range of motion intact Cardiovascular:     Rate and Rhythm: Normal rate and regular rhythm.     Heart sounds: No murmur  heard.   No friction rub. No gallop.     Comments: Patient does have a somewhat irregular rhythm, quiet 2/6 or 3/6 systolic murmur.  No tenderness to palpation chest wall. Pulmonary:     Effort: Pulmonary effort is normal.     Breath sounds: Normal breath sounds.     Comments: Very minimal end expiratory wheezing with forced expiration.  Otherwise clear breath sounds with some scattered rhonchi that clear with cough.  No biphasic wheezing, no stridor, no rales.  Good excursion in bilateral lung fields. Abdominal:     General: Bowel sounds are normal.     Palpations: Abdomen is soft.     Comments: No tenderness to palpation abdomen.  No rebound, rigidity, guarding, normal bowel sounds throughout.  Skin:    General: Skin is warm and dry.     Capillary Refill: Capillary refill takes less than 2 seconds.  Neurological:     Mental Status: She is alert and oriented to person, place, and time.  Psychiatric:        Mood and Affect: Mood normal.        Behavior: Behavior normal.    ED Results / Procedures / Treatments   Labs (all labs ordered are listed, but only abnormal results are displayed) Labs Reviewed  RESP PANEL BY RT-PCR (FLU A&B, COVID) [id number]    EKG None  Radiology DG Chest 2 View  Result Date: 07/05/2021 CLINICAL DATA:  Shortness of breath. EXAM: CHEST - 2 VIEW COMPARISON:  Chest radiograph dated 02/18/2021. FINDINGS: The heart size and mediastinal contours are within normal limits. Both lungs are clear. The visualized skeletal structures are unremarkable. IMPRESSION: No active cardiopulmonary disease. Electronically Signed   By: Anner Crete M.D.   On: 07/05/2021 22:42    Procedures Procedures    Medications Ordered in ED Medications  albuterol (VENTOLIN HFA) 108 (90 Base) MCG/ACT inhaler 2 puff (has no administration in time range)    ED Course/ Medical Decision Making/ A&P                           Medical Decision Making Amount and/or Complexity of Data  Reviewed Radiology: ordered.  Risk Prescription drug management.  Is a somewhat ill-appearing patient with past medical history of asthma who presents with 2 weeks of persistent cough, nausea, vomiting.  She reports that her symptoms began with what sounds like a viral illness.  My differential diagnosis includes pneumonia, COVID, flu, acute bronchitis, asthma exacerbation, less clinical concern for septic embolism, PE, Gastroenteritis versus other.  This is not an exhaustive differential.  Minimal clinical concern for ACS, Boerhaave's, dissection, aneurysm as patient has no chest pain at rest,  has had overall stable vital signs, and has likely alternative diagnosis.  My physical exam is significant for minimal end expiratory wheezing on forced expiration, no cervical lymphadenopathy, clear oropharynx, some occasional premature beats consistent with documented history of frequent PVCs.  She does have a quiet systolic murmur consistent with history of previous.  Without significant wheezing, patient's symptoms are not consistent with an acute asthma exacerbation.  As she is afebrile, and otherwise well-appearing, with negative chest x-ray, negative COVID, flu swab believe her presentation is consistent with an acute bronchitis.  I independently reviewed her radiographic imaging of the chest and agree with radiologist interpretation.  Patient appears stable for discharge at this time, will discharge with burst course of prednisone, Hycodan cough syrup, refill of albuterol inhaler, and Zofran for nausea and vomiting.  Encouraged close follow-up with PCP to ensure resolution of symptoms.  Encouraged return to the emergency department if shortness of breath worsens, or symptoms worsen despite treatment.  Patient understands and agrees to plan, and discharged in stable condition at this time.  Final Clinical Impression(s) / ED Diagnoses Final diagnoses:  Acute bronchitis, unspecified organism  Acute cough     Rx / DC Orders ED Discharge Orders          Ordered    HYDROcodone bit-homatropine (HYCODAN) 5-1.5 MG/5ML syrup  Every 6 hours PRN        07/06/21 0841    predniSONE (DELTASONE) 20 MG tablet  Daily        07/06/21 0841    ondansetron (ZOFRAN) 4 MG tablet  Every 6 hours        07/06/21 0841    albuterol (VENTOLIN HFA) 108 (90 Base) MCG/ACT inhaler  Every 6 hours PRN        07/06/21 0841              Wilsie Kern, Jackalyn Lombard 07/06/21 [id number]    Davonna Belling, MD 07/09/21 1447

## 2021-07-06 NOTE — Discharge Instructions (Addendum)
Como comentamos, su presentacin de hoy es consistente con una bronquitis aguda causada por una infeccin viral hace 2 semanas. Dada la gravedad de sus sntomas continuos, tos, creo que se beneficiara de una dosis de esteroides en pulsos. [employer] [address] de prednisona, como ya comentamos, puede aumentar su nivel de [place], Development worker, international aid algo de insomnio y un aumento del apetito, as como algo de hinchazn en las extremidades; sin embargo, debera mejorar la inflamacin de los pulmones. y Environmental manager. Adems, le he enviado algunos medicamentos para las nuseas para ayudarlo con su apetito y cualquier vmito que pueda tener, un jarabe para la tos [place], as Clinical biochemist en caso de que lo necesite. Le recomiendo que haga un seguimiento para volver a controlarse para asegurarse de que est mejorando en la prxima semana con su mdico de atencin primaria.  Fue un placer atenderte hoy, espero que te sientas mejor pronto.

## 2021-07-08 ENCOUNTER — Telehealth: Payer: Self-pay

## 2021-07-08 NOTE — Telephone Encounter (Signed)
Pharmacy called to inform that they do not have samples of 100-50 advair, only 250-50. Confirmed with Dr Delrae Alfred that the patient can receive a sample of 250-50 Advair. Pharmacy will place order for the 100-50 advair

## 2021-08-11 ENCOUNTER — Other Ambulatory Visit: Payer: Self-pay

## 2021-08-11 DIAGNOSIS — E782 Mixed hyperlipidemia: Secondary | ICD-10-CM

## 2021-08-12 LAB — LIPID PANEL W/O CHOL/HDL RATIO
Cholesterol, Total: 268 mg/dL — ABNORMAL HIGH (ref 100–199)
HDL: 34 mg/dL — ABNORMAL LOW (ref >39)
LDL Chol Calc (NIH): 169 mg/dL — ABNORMAL HIGH (ref 0–99)
Triglycerides: 339 mg/dL — ABNORMAL HIGH (ref 0–149)
VLDL Cholesterol Cal: 65 mg/dL — ABNORMAL HIGH (ref 5–40)

## 2021-09-01 MED ORDER — ATORVASTATIN CALCIUM 40 MG PO TABS: ORAL_TABLET | ORAL | 11 refills | Status: DC

## 2021-10-02 ENCOUNTER — Encounter: Payer: Self-pay | Admitting: Internal Medicine

## 2021-10-02 ENCOUNTER — Ambulatory Visit: Payer: Self-pay | Admitting: Internal Medicine

## 2021-10-02 VITALS — BP 102/70 | HR 64 | Resp 12 | Ht 60.0 in | Wt 167.0 lb

## 2021-10-02 DIAGNOSIS — L309 Dermatitis, unspecified: Secondary | ICD-10-CM

## 2021-10-02 DIAGNOSIS — J4540 Moderate persistent asthma, uncomplicated: Secondary | ICD-10-CM

## 2021-10-02 DIAGNOSIS — Q2112 Patent foramen ovale: Secondary | ICD-10-CM

## 2021-10-02 DIAGNOSIS — E782 Mixed hyperlipidemia: Secondary | ICD-10-CM

## 2021-10-02 DIAGNOSIS — B354 Tinea corporis: Secondary | ICD-10-CM

## 2021-10-02 DIAGNOSIS — I493 Ventricular premature depolarization: Secondary | ICD-10-CM

## 2021-10-02 DIAGNOSIS — Z599 Problem related to housing and economic circumstances, unspecified: Secondary | ICD-10-CM | POA: Insufficient documentation

## 2021-10-02 NOTE — Progress Notes (Signed)
? ? ?Subjective:  ?  ?Patient ID: Felicia Ray, female   DOB: April 08, 1966, 56 y.o.   MRN: [id number] ? ? ?HPI ? ?Felicia Ray interprets ? ?Reactive airway disease:  Was noted to have wheezing with CPE in January and was to restart Zyrtec for allergies and Advair 100/50.   ?Unable to fill before worsened and was seen 2 days later in ED; diagnosed with acute bronchitis and given prednisone burst and taper.  No antibiotics.     ?Feels the Advair helped her breathing the most, but has been doing well without it for about 1 month now.  (Since mid March when she completed a self wean.  She does have albuterol MDI, which she has not used in some time. ? ?History of childhood asthma from when she was quite young until about age 71 yo.  Had not had any issues with breathing until suffered COVID in 2020.  She was negative for COVID when tested with onset of breathing issues in January.  ? ?Feels fine now.   ? ?2.  Hyperlipidemia:  Off Atorvastatin again.  Was not working regularly and did not have the funds to purchase.  Has been off med for months. ? ?3.  Skin lesions on anterior chest in January:  resolved with combined use of Terbinafine and Triamcinolone cream.  Resolved in 4 days, but treated for 2 weeks as recommended.   ? ?4.  Wondering if okay if she continues Co Q 10.  Was taking due to fatigue and feels better now.  A friend recommended.  Encouraged eating a health diet and having daily physically activity.  Her knees bother her, however, during cold weather and has just returned to walking regularly.  ? ?5.  Frequent PVCs with small PFO:  taking Metoprolol.  She has developed a very high medical bill for this.  Apparently discounted and patient was told she cannot apply for financial assistance.  She has a repeat echo and follow up with Dr. Elease Hashimoto in June, but is concerned to increase her medical debt.  PFO was felt to be quite small, has not affected function of her heart and no stroke, so watching  for now.   ?Has infrequent skipped beat now.  No longer with left chest discomfort as well. ? ?6.  HM:  has not had COVID bivalent booster.  Has also not had shingles vaccine.   ? ?Current Meds  ?Medication Sig  ? acetaminophen (TYLENOL) 500 MG tablet Take 2 tablets (1,000 mg total) by mouth every 6 (six) hours as needed for moderate pain or mild pain.  ? Coenzyme Q10 (COQ10 PO) Take by mouth daily.  ? ibuprofen (ADVIL) 200 MG tablet Take 400 mg by mouth every 6 (six) hours as needed for mild pain.  ? metoprolol tartrate (LOPRESSOR) 25 MG tablet Take 0.5 tablets (12.5 mg total) by mouth 2 (two) times daily.  ? Omega-3 Fatty Acids (FISH OIL PO) Take 1 capsule by mouth daily.  ? ?Allergies  ?Allergen Reactions  ? Aspirin Anaphylaxis  ?  "Almost died after she was given this- felt like a heart attack"  ? Dipyrone Anaphylaxis and Hives  ?  "Almost died after she was given this- felt like a heart attack" (also)  ? Dipyrone Hives  ? Other   ?  metamizole - blindness, n/v (an international drug)  ? ? ? ?Review of Systems ? ? ? ?Objective:  ? ?BP 102/70 (BP Location: Left Arm, Patient Position: Sitting, Cuff  Size: Normal)   Pulse 64   Resp 12   Ht 5' (1.524 m)   Wt 167 lb (75.8 kg)   LMP  (LMP Unknown)   BMI 32.61 kg/m?  ? ?Physical Exam ?NAD ?HEENT:  PERRL, EOMI, TMs pearly gray, Posterior pharynx with scant cobbling.  No exudate ?Neck:  Supple, No adenopathy ?Chest:  CTA without wheeze.  Good air movement. ?CV:  RRR with Normal S1 and S2, No S3, S4 or murmur.  Carotid, radial and DP pulses normal and equal. ?Abd:  S, NT, + BS, No HSM or mass. ?LE:  No edema. ?Skin:  no lesions ? ? ?Assessment & Plan  ? ? Reactive airway disease:  recurred following COVID in 2020.  Has been symptom free for a month off Advair and Albuterol.  Has both inhalers at home if recurs with symptoms.  To call if using rescue inhaler more than once weekly ? ?2.  Allergies:  appears to have some mild signs.  To use Zyrtec as needed. ? ?3.   Hyperlipidemia:  encouraged getting back on Atorvastatin.  To call in future if unable to afford medication and we can see if there is temporary support.  FLP and hepatic profile in 2 months. ? ?4.  Financial issues:  will see about getting her into [employer] program for food, cooking and art classes to somewhat defray cost of food.  May need to come to monthly mobile market as well.  Given [employer] application as well. ? ?5.  PVCs and PFO:  Will see if can get improved financial assistance before June Cards appt so will not miss. ? ?6.  Eczema vs tinea corporis:  resolved with combination of corticosteroid and Terbinafine creams. ? ?7.  HM:  shingrix and Moderna bivalent booster next week when does not have to work the next day.  CPE in January without pap ? ? ? ?

## 2021-10-08 ENCOUNTER — Ambulatory Visit: Payer: Self-pay | Admitting: Internal Medicine

## 2021-11-24 ENCOUNTER — Other Ambulatory Visit: Payer: Self-pay

## 2021-11-24 DIAGNOSIS — E782 Mixed hyperlipidemia: Secondary | ICD-10-CM

## 2021-11-25 LAB — HEPATIC FUNCTION PANEL
ALT: 19 IU/L (ref 0–32)
AST: 20 IU/L (ref 0–40)
Albumin: 4.1 g/dL (ref 3.8–4.9)
Alkaline Phosphatase: 107 IU/L (ref 44–121)
Bilirubin Total: <0.2 mg/dL (ref 0.0–1.2)
Bilirubin, Direct: 0.07 mg/dL (ref 0.00–0.40)
Total Protein: 6.5 g/dL (ref 6.0–8.5)

## 2021-11-25 LAB — LIPID PANEL W/O CHOL/HDL RATIO
Cholesterol, Total: 291 mg/dL — ABNORMAL HIGH (ref 100–199)
HDL: 29 mg/dL — ABNORMAL LOW (ref >39)
LDL Chol Calc (NIH): 135 mg/dL — ABNORMAL HIGH (ref 0–99)
Triglycerides: 673 mg/dL (ref 0–149)
VLDL Cholesterol Cal: 127 mg/dL — ABNORMAL HIGH (ref 5–40)

## 2021-12-01 ENCOUNTER — Ambulatory Visit (HOSPITAL_COMMUNITY): Payer: No Typology Code available for payment source | Attending: Internal Medicine

## 2021-12-01 DIAGNOSIS — Z79899 Other long term (current) drug therapy: Secondary | ICD-10-CM | POA: Insufficient documentation

## 2021-12-01 DIAGNOSIS — I493 Ventricular premature depolarization: Secondary | ICD-10-CM | POA: Insufficient documentation

## 2021-12-01 DIAGNOSIS — R002 Palpitations: Secondary | ICD-10-CM | POA: Insufficient documentation

## 2021-12-01 LAB — ECHOCARDIOGRAM COMPLETE
Area-P 1/2: 1.95 cm2
S' Lateral: 3 cm

## 2021-12-05 ENCOUNTER — Telehealth: Payer: Self-pay

## 2021-12-05 DIAGNOSIS — I493 Ventricular premature depolarization: Secondary | ICD-10-CM

## 2021-12-05 DIAGNOSIS — R931 Abnormal findings on diagnostic imaging of heart and coronary circulation: Secondary | ICD-10-CM

## 2021-12-08 ENCOUNTER — Ambulatory Visit: Payer: No Typology Code available for payment source | Admitting: Cardiovascular Disease

## 2021-12-18 ENCOUNTER — Encounter: Payer: Self-pay | Admitting: Internal Medicine

## 2021-12-18 ENCOUNTER — Ambulatory Visit: Payer: Self-pay | Admitting: Internal Medicine

## 2021-12-18 VITALS — BP 110/80 | HR 60 | Ht 60.0 in | Wt 168.5 lb

## 2021-12-18 DIAGNOSIS — M67431 Ganglion, right wrist: Secondary | ICD-10-CM

## 2021-12-18 DIAGNOSIS — L812 Freckles: Secondary | ICD-10-CM

## 2021-12-18 DIAGNOSIS — G8929 Other chronic pain: Secondary | ICD-10-CM

## 2021-12-18 DIAGNOSIS — M25511 Pain in right shoulder: Secondary | ICD-10-CM

## 2021-12-18 MED ORDER — ATORVASTATIN CALCIUM 40 MG PO TABS: ORAL_TABLET | ORAL | 11 refills | Status: DC

## 2021-12-18 NOTE — Progress Notes (Signed)
Subjective:    Patient ID: Felicia Ray, female   DOB: [birth year], 56 y.o.   MRN: [id number]   HPI  Tildon Husky interprets   Mass on volar right wrist for about 4 months.  Has enlarged over time.  No pain.  Does not affect function of hand.  No numbness or tingling of hand or fingers.    2.  Also with dark brown freckles on her right forearm.  Concerned they are getting bigger.  Has had the lesions for 20 years.  Just noted enlargement last 2 months.  No itching or bleeding.  3. Right shoulder pain:  Starts in front--points to pectoral area- and radiates to back of shoulder.  Her arm feels heavy.  She can have a bit of shortness of breath with the pain.  No numbness or tingling in arm.  Pain lasts 10-15 minutes.  Has had for 3 months.  No history of acute injury.  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  Does not have the pain with this work.  Pain is more prominent with sleep or in the morning.   Has taken ibuprofen 200 mg with possibly minimal help--she feels it helps with ROM.    4.  Decreasing EF with frequent PVCs:  Is being sent to EP for further evaluation by Dr. Nahser/cardiology  Current Meds  Medication Sig   acetaminophen (TYLENOL) 500 MG tablet Take 2 tablets (1,000 mg total) by mouth every 6 (six) hours as needed for moderate pain or mild pain.   albuterol (VENTOLIN HFA) 108 (90 Base) MCG/ACT inhaler Inhale 1-2 puffs into the lungs every 6 (six) hours as needed for wheezing or shortness of breath.   atorvastatin (LIPITOR) 40 MG tablet 1/2 tab or 20 mg daily with evening meal.   ibuprofen (ADVIL) 200 MG tablet Take 400 mg by mouth every 6 (six) hours as needed for mild pain.   metoprolol tartrate (LOPRESSOR) 25 MG tablet Take 0.5 tablets (12.5 mg total) by mouth 2 (two) times daily.    Omega-3 Fatty Acids (FISH OIL PO) Take 1 capsule by mouth daily.   Allergies  Allergen Reactions   Aspirin Anaphylaxis    "Almost died after she was given this- felt like a heart attack"   Dipyrone Anaphylaxis and Hives    "Almost died after she was given this- felt like a heart attack" (also)   Dipyrone Hives   Other     metamizole - blindness, n/v (an international drug)     Review of Systems    Objective:   BP 110/80 (BP Location: Right Arm, Patient Position: Sitting, Cuff Size: Normal)   Pulse 60   Ht 5' (1.524 m)   Wt 168 lb 8 oz (76.4 kg)   LMP  (LMP Unknown)   BMI 32.91 kg/m   Physical Exam NAD Lungs:  CTA CV:   RRR without murmur or rub.  Radial pulses normal and equal. MS:  1 cm cystic lesion on right radial volar wrist area.  NT.  Full ROM of wrist and fingers.   Right shoulder with full ROM without any discomfort.  NT over musculature, subacromial bursa, AC, CC joint.  NT on palpation of right pectoral muscles.   Skin:  has an oblong 3 mm by 2 mm and pinpoint similar  nonpalpable uniformly dark brown lesions on her right forearm.  Edged well circumscribed.  No flaking or bleeding.     Assessment & Plan    Right radial wrist ganglion cyst:  to call if interferes with function or developing pain.  2.  Skin lesions:  Discussed appear benign and changes to look for requiring a call back to office.  3.  Right pectoral/shoulder pain:  no clear etiology for relatively short lived but recurrent symptoms.  Currently sounds muscular.  Currently without findings and asymptomatic.  To try Ibuprofen 300 mg twice daily with meals for 14 days and see if makes a difference.  If no improvement, to call and consider chest Xray.  Addendum:  Had cholesterol checked recently and cholesterol continues to increase, despite restarting Atorvastatin 20 mg.  Suspect she is not taking the medication regularly, but appears she also needs a higher dose.

## 2021-12-18 NOTE — Addendum Note (Signed)
Addended by: Marcene Duos on: 12/18/2021 03:19 PM   Modules accepted: Orders

## 2021-12-18 NOTE — Patient Instructions (Signed)
Ibuprofen 200 mg, 3 pastillas dos veces al dia con comida para 14 dias

## 2022-01-26 ENCOUNTER — Ambulatory Visit (INDEPENDENT_AMBULATORY_CARE_PROVIDER_SITE_OTHER): Payer: Self-pay | Admitting: Cardiology

## 2022-01-26 ENCOUNTER — Encounter: Payer: Self-pay | Admitting: Cardiology

## 2022-01-26 VITALS — BP 110/82 | HR 62 | Ht 59.5 in | Wt 172.4 lb

## 2022-01-26 DIAGNOSIS — I493 Ventricular premature depolarization: Secondary | ICD-10-CM

## 2022-01-26 MED ORDER — FLECAINIDE ACETATE 50 MG PO TABS: 50.0000 mg | ORAL_TABLET | Freq: Two times a day (BID) | ORAL | 3 refills | Status: DC

## 2022-01-26 NOTE — Progress Notes (Signed)
Electrophysiology Office Note   Date:  01/26/2022   ID:  Felicia Ray, Dantuono 1965-07-06, MRN [id number]  PCP:  Julieanne Manson, MD  Cardiologist:  Nahser Primary Electrophysiologist:  Beth[place]hem Langstaff Jorja Loa, MD    Chief Complaint: PVC   History of Present Illness: Felicia Ray is a 56 y.o. female who is being seen today for the evaluation of PVC at the request of Nahser, Deloris Ping, MD. Presenting today for electrophysiology evaluation.  She initially presented to cardiology clinic with palpitations.  Palpitations occur multiple times of the day.  She went to the emergency room with chest pain and dizziness.  She was found to have an elevated PVC burden.  She feels her palpitations with walking, though she does very little exercise.  She does have shortness of breath when she has her palpitations.  It was initially thought that she should be monitored and thus no antiarrhythmic therapy was started, though her ejection fraction is now mildly reduced.  Today, she denies symptoms of palpitations, chest pain, orthopnea, PND, lower extremity edema, claudication, dizziness, presyncope, syncope, bleeding, or neurologic sequela. The patient is tolerating medications without difficulties.  He feels intermittently short of breath and fatigue.  She states that she feels this way when she is lifting heavy things.   Past Medical History:  Diagnosis Date   Asthma    Chest pain    Chronic pain syndrome 01/23/2014   Fall by slipping on water at work.  Worker's Comp case.  Followed by Dr. Corine Shelter,  and [facility].  Waiting for second opinion.  Not clear why left low back and knee are not improving.  Called neuropathic pain by Dr. Ethelene Hal.   Environmental and seasonal allergies    Frequent PVCs    HLD (hyperlipidemia)    PFO (patent foramen ovale) 04/03/2021   Echo with Dr. Elease Hashimoto with predominantly L>R shunt.   Past Surgical History:  Procedure Laterality Date   TUBAL  LIGATION  07/20/2005     Current Outpatient Medications  Medication Sig Dispense Refill   acetaminophen (TYLENOL) 500 MG tablet Take 2 tablets (1,000 mg total) by mouth every 6 (six) hours as needed for moderate pain or mild pain. 30 tablet 0   albuterol (VENTOLIN HFA) 108 (90 Base) MCG/ACT inhaler Inhale 1-2 puffs into the lungs every 6 (six) hours as needed for wheezing or shortness of breath. 8 g 1   atorvastatin (LIPITOR) 40 MG tablet 1 tab by mouth with evening meal daily. 30 tablet 11   flecainide (TAMBOCOR) 50 MG tablet Take 1 tablet (50 mg total) by mouth 2 (two) times daily. 60 tablet 3   ibuprofen (ADVIL) 200 MG tablet Take 400 mg by mouth every 6 (six) hours as needed for mild pain.     metoprolol tartrate (LOPRESSOR) 25 MG tablet Take 0.5 tablets (12.5 mg total) by mouth 2 (two) times daily. 90 tablet 3   Omega-3 Fatty Acids (FISH OIL PO) Take 1 capsule by mouth daily.     No current facility-administered medications for this visit.    Allergies:   Aspirin, Dipyrone, Dipyrone, and Other   Social History:  The patient  reports that she has never smoked. She has never used smokeless tobacco. She reports that she does not drink alcohol and does not use drugs.   Family History:  The patient's family history includes Asthma in her mother; Cervical cancer in her maternal grandmother; Diabetes in her sister; Heart disease in  her mother; Hyperlipidemia in her brother, mother, and sister; Hypertension in her brother, mother, and sister; Obesity in her mother.    ROS:  Please see the history of present illness.   Otherwise, review of systems is positive for none.   All other systems are reviewed and negative.    PHYSICAL EXAM: VS:  BP 110/82   Pulse 62   Ht 4' 11.5" (1.511 m)   Wt 172 lb 6.4 oz (78.2 kg)   LMP  (LMP Unknown)   SpO2 96%   BMI 34.24 kg/m  , BMI Body mass index is 34.24 kg/m. GEN: Well nourished, well developed, in no acute distress  HEENT: normal  Neck: no JVD,  carotid bruits, or masses Cardiac: RRR; no murmurs, rubs, or gallops,no edema  Respiratory:  clear to auscultation bilaterally, normal work of breathing GI: soft, nontender, nondistended, + BS MS: no deformity or atrophy  Skin: warm and dry Neuro:  Strength and sensation are intact Psych: euthymic mood, full affect  EKG:  EKG is ordered today. Personal review of the ekg ordered shows this rhythm, PVC  Recent Labs: 02/18/2021: Hemoglobin 14.1; Platelets 239 06/23/2021: BUN 12; Creatinine, Ser 0.78; Potassium 4.6; Sodium 138; TSH 2.960 11/24/2021: ALT 19    Lipid Panel     Component Value Date/Time   CHOL 291 (H) 11/24/2021 1027   TRIG 673 (HH) 11/24/2021 1027   HDL 29 (L) 11/24/2021 1027   CHOLHDL 8.3 01/13/2019 0334   VLDL UNABLE TO CALCULATE IF TRIGLYCERIDE OVER 400 mg/dL 01/13/2019 0334   LDLCALC 135 (H) 11/24/2021 1027   LDLDIRECT 82.5 01/13/2019 0334     Wt Readings from Last 3 Encounters:  01/26/22 172 lb 6.4 oz (78.2 kg)  12/18/21 168 lb 8 oz (76.4 kg)  10/02/21 167 lb (75.8 kg)      Other studies Reviewed: Additional studies/ records that were reviewed today include: TTE 12/01/21  Review of the above records today demonstrates:   1. Left ventricular ejection fraction, by estimation, is 50 to 55%. Left  ventricular ejection fraction by 3D volume is 50 %. The left ventricle has  low normal function. Left ventricular endocardial border not optimally  defined to evaluate regional wall  motion. Left ventricular diastolic parameters are consistent with Grade I  diastolic dysfunction (impaired relaxation).   2. Right ventricular systolic function is normal. The right ventricular  size is normal. There is normal pulmonary artery systolic pressure. The  estimated right ventricular systolic pressure is [id number] mmHg.   3. The mitral valve is normal in structure. Trivial mitral valve  regurgitation. No evidence of mitral stenosis.   4. The aortic valve is tricuspid. Aortic valve  regurgitation is trivial.  No aortic stenosis is present.   5. The inferior vena cava is normal in size with greater than 50%  respiratory variability, suggesting right atrial pressure of 3 mmHg.   Cardiac monitor 04/29/2021 personally reviewed Sinus rhythm Frequent PVC , frequently in a bigeminal or trigeminal pattern 23% ventricular ectopy burden  ASSESSMENT AND PLAN:  1.  PVCs: Patient has an elevated burden at 23%.  Currently on metoprolol.  She feels somewhat weak and fatigued.  This could be due to her elevated PVC burden.  Her ejection fraction is also low normal where previously it was in the normal range.  We Woodford Strege start her on flecainide 50 mg daily.  PVCs appear to be outflow tract in nature.  We Jamerica Snavely have her come back for an ECG in 2 weeks.  2.  Obesity: Diet and exercise encouraged Body mass index is 34.24 kg/m.  Case discussed with primary cardiology  Current medicines are reviewed at length with the patient today.   The patient does not have concerns regarding her medicines.  The following changes were made today: Start flecainide  Labs/ tests ordered today include:  Orders Placed This Encounter  Procedures   EKG 12-Lead     Disposition:   FU with Jibri Schriefer 3 months  Signed, Ziah Turvey Jorja Loa, MD  01/26/2022 10:47 AM     [facility] [address] [place] [address] [phone] (office) [phone] (fax)

## 2022-01-26 NOTE — Patient Instructions (Addendum)
Medication Instructions:  Your physician has recommended you make the following change in your medication:  START Flecainide 50 mg twice a day  *If you need a refill on your cardiac medications before your next appointment, please call your pharmacy*   Lab Work: None ordered   Testing/Procedures: None ordered   Follow-Up: At [facility], you and your health needs are our priority.  As part of our continuing mission to provide you with exceptional heart care, we have created designated Provider Care Teams.  These Care Teams include your primary Cardiologist (physician) and Advanced Practice Providers (APPs -  Physician Assistants and Nurse Practitioners) who all work together to provide you with the care you need, when you need it.   Your physician recommends that you schedule a follow-up appointment in: 2 weeks for nurse visit EKG    Your next appointment:   3 month(s)  The format for your next appointment:   In Person  Provider:   Loman Brooklyn, MD    Thank you for choosing [facility]!!   Dory Horn, RN [phone]  Other Instructions   Flecainide Tablets Qu es este medicamento? La FLECAINIDA previene y trata la frecuencia cardiaca rpida o irregular (arritmia). Con frecuencia se [place] para tratar un tipo de arritmia conocida como fibrilacin auricular. Acta desacelerando las seales elctricas demasiado activas en el corazn y as estabiliza el ritmo cardiaco. Pertenece a un grupo de medicamentos llamados antiarrtmicos. Este medicamento puede ser utilizado para otros usos; si tiene alguna pregunta consulte con su proveedor de atencin mdica o con su farmacutico. MARCAS COMUNES: Tambocor Qu le debo informar a mi profesional de la salud antes de tomar este medicamento? Necesitan saber si usted presenta alguno de los siguientes problemas o situaciones: Niveles anormales de potasio en la sangre Enfermedad cardiaca, incluso problemas con el ritmo cardiaco  y la frecuencia cardiaca Enfermedad renal o heptica Ataque cardiaco reciente Una reaccin alrgica o inusual a la flecainida, a los anestsicos locales, a otros medicamentos, alimentos, colorantes o conservantes Si est embarazada o buscando quedar embarazada Si est amamantando a un beb Cmo debo [employer]? Tome este medicamento por va oral con un vaso de agua. Siga las instrucciones de la etiqueta del Minto. Puede tomar este medicamento con o sin alimentos. Administre sus dosis a intervalos regulares. No use su medicamento con una frecuencia mayor a la indicada. No deje de usar PPL Corporation de [employer]ne[employer]. Esto podra causar efectos secundarios graves relacionados con el corazn. Si su equipo de atencin desea que deje de Astronomer, la dosis podra ir disminuyndose lentamente durante un tiempo para Printmaker secundario. Hable con su equipo de atencin sobre el uso de este medicamento en nios. Aunque este medicamento se puede recetar a nios tan pequeos como de 1 ao de edad con ciertas afecciones, existen precauciones que deben tomarse. Sobredosis: Pngase en contacto inmediatamente con un centro toxicolgico o una sala de urgencia si usted cree que haya tomado demasiado medicamento. ATENCIN: [employer] es solo para usted. No comparta este medicamento con nadie. Qu sucede si me olvido de una dosis? Si olvida una dosis, tmela lo antes posible. Si es casi la hora de la prxima dosis, tome slo esa dosis. No tome dosis adicionales o dobles. Qu puede interactuar con este medicamento? No use este medicamento con ninguno de los siguientes productos: Amoxapina Trixido de arsnico Ciertos antibiticos, tales como claritromicina, eritromicina, gatifloxacino, gemifloxacino, levofloxacino, moxifloxacino, esparfloxacino, troleandomicina Ciertos antidepresivos llamados antidepresivos tricclicos, tales como  amitriptilina, imipramina o  nortriptilina Ciertos medicamentos para controlar el ritmo cardiaco, tales como disopiramida, encainida, moricizina, procainamida, propafenona y quinidina Cisaprida Delavirdina Droperidol Haloperidol Espino blanco Imatinib Levometadil Maprotilina Medicamentos para la malaria, tales como cloroquina y Chartered certified accountant Fenotiazinas, tales como Barista, Manufacturing engineer, Paediatric nurse, tioridazina Pimozida Quinina Ranolazina Ritonavir Sertindol Este medicamento tambin podra Product/process development scientist con los siguientes productos: Cimetidina Dofetilida Medicamentos para la angina de pecho o la presin arterial alta Medicamentos para Chief Operating Officer el ritmo cardiaco tales como amiodarona y digoxina Ziprasidona Puede ser que esta lista no menciona todas las posibles interacciones. Informe a su profesional de [employer] de [employer] productos a base de hierbas, medicamentos de Icard o suplementos nutritivos que est tomando. Si usted fuma, consume bebidas alcohlicas o si utiliza drogas ilegales, indqueselo tambin a su profesional de [employer]. Algunas sustancias pueden interactuar con su medicamento. A qu debo estar atento al usar PPL Corporation? Visite a su equipo de atencin para que revise su evolucin peridicamente. Debido a que su afeccin y el uso de este medicamento implican cierto riesgo, es buena idea llevar una tarjeta de identificacin, collar o brazalete con los detalles de su afeccin, medicamentos y mdico o profesional de Radiographer, therapeutic. Revise su presin arterial y su pulso regularmente. Pregunte a su equipo de atencin cul debe ser su presin arterial y pulso y cundo deber contactarlos. Su equipo de atencin podra programar anlisis de [place] y electrocardiogramas peridicos para revisar su evolucin. Puede experimentar somnolencia o mareos. No conduzca, no utilice maquinaria ni haga nada que Scientist, research (life sciences) en estado de alerta hasta que sepa cmo le afecta este  medicamento. No se siente ni se ponga de pie con rapidez, especialmente si es un paciente de edad avanzada. Esto reduce el riesgo de mareos o [employer]. El alcohol puede hacer que se sienta ms mareado, aumentar el rubor y causar una freciuencia cardiaca rpida. Evite consumir bebidas alcohlicas. Qu efectos secundarios puedo tener al [employer] este medicamento? Efectos secundarios que debe informar a su equipo de atencin tan pronto como sea posible: Reacciones alrgicas: erupcin cutnea, comezn/picazn, urticaria, hinchazn de la cara, los labios, la lengua o la garganta Insuficiencia cardiaca: falta de aire, hinchazn de los tobillos, los pies o las manos, aumento de peso repentino, debilidad o fatiga inusuales Cambios en el ritmo cardiaco: frecuencia cardiaca rpida o irregular, mareos, sensacin de desmayo o aturdimiento, Journalist, newspaper, dificultad para respirar Lesin en el hgado: dolor en la regin abdominal superior derecha, prdida de apetito, nuseas, heces de color claro, orina amarilla oscura o marrn, color amarillento de los ojos o la piel, debilidad o fatiga inusuales Efectos secundarios que generalmente no requieren atencin mdica (debe informarlos a su equipo de atencin si persisten o si son molestos): Visin borrosa Estreimiento Water engineer Dolor de cabeza Nuseas Temblores o sacudidas Puede ser que esta lista no menciona todos los posibles efectos secundarios. Comunquese a su mdico por asesoramiento mdico [employer]lett[employer]ard. Usted puede informar los efectos secundarios a la FDA por telfono al 1-800-FDA-1088. Dnde debo guardar mi medicina? Mantenga fuera del alcance de nios y Neurosurgeon. Guarde a [employer], entre 15 y 30 grados Celsius (59 y 48 grados Fahrenheit). Proteja de Statistician. Mantenga el recipiente bien cerrado. Deseche todo el medicamento que no haya utilizado despus de la fecha de vencimiento. ATENCIN: Este folleto es un resumen.  Puede ser que no cubra toda la posible informacin. Si usted tiene preguntas acerca de esta medicina, consulte con su mdico, su farmacutico  o su profesional de [employer].  2023 Elsevier/Gold Standard (2020-12-30 00:00:00)

## 2022-02-05 ENCOUNTER — Other Ambulatory Visit: Payer: Self-pay

## 2022-02-05 DIAGNOSIS — E782 Mixed hyperlipidemia: Secondary | ICD-10-CM

## 2022-02-06 LAB — HEPATIC FUNCTION PANEL
ALT: 12 IU/L (ref 0–32)
AST: 17 IU/L (ref 0–40)
Albumin: 4.3 g/dL (ref 3.8–4.9)
Alkaline Phosphatase: 77 IU/L (ref 44–121)
Bilirubin Total: 0.4 mg/dL (ref 0.0–1.2)
Bilirubin, Direct: <0.1 mg/dL (ref 0.00–0.40)
Total Protein: 6.4 g/dL (ref 6.0–8.5)

## 2022-02-06 LAB — LIPID PANEL W/O CHOL/HDL RATIO
Cholesterol, Total: 231 mg/dL — ABNORMAL HIGH (ref 100–199)
HDL: 32 mg/dL — ABNORMAL LOW (ref >39)
LDL Chol Calc (NIH): 124 mg/dL — ABNORMAL HIGH (ref 0–99)
Triglycerides: 423 mg/dL — ABNORMAL HIGH (ref 0–149)
VLDL Cholesterol Cal: 75 mg/dL — ABNORMAL HIGH (ref 5–40)

## 2022-02-12 ENCOUNTER — Ambulatory Visit (INDEPENDENT_AMBULATORY_CARE_PROVIDER_SITE_OTHER): Payer: PRIVATE HEALTH INSURANCE | Admitting: Internal Medicine

## 2022-02-12 ENCOUNTER — Encounter: Payer: Self-pay | Admitting: *Deleted

## 2022-02-12 ENCOUNTER — Ambulatory Visit: Payer: PRIVATE HEALTH INSURANCE | Attending: Internal Medicine | Admitting: Internal Medicine

## 2022-02-12 VITALS — BP 100/70 | HR 72 | Resp 16

## 2022-02-12 DIAGNOSIS — R55 Syncope and collapse: Secondary | ICD-10-CM

## 2022-02-12 DIAGNOSIS — I493 Ventricular premature depolarization: Secondary | ICD-10-CM

## 2022-02-12 DIAGNOSIS — R931 Abnormal findings on diagnostic imaging of heart and coronary circulation: Secondary | ICD-10-CM

## 2022-02-12 NOTE — Addendum Note (Signed)
Addended by: Macie Burows on: 02/12/2022 03:00 PM   Modules accepted: Orders

## 2022-02-12 NOTE — Patient Instructions (Signed)
Medication Instructions:  No changes *If you need a refill on your cardiac medications before your next appointment, please call your pharmacy*   Lab Work: none If you have labs (blood work) drawn today and your tests are completely normal, you will receive your results only by: MyChart Message (if you have MyChart) OR A paper copy in the mail If you have any lab test that is abnormal or we need to change your treatment, we will call you to review the results.   Testing/Procedures: Your physician has requested that you have a lexiscan myoview. For further information please visit [email or web address] Please follow instruction sheet, as given.   Follow-Up: As planned  Contraccin ventricular prematura Premature Ventricular Contraction  Una contraccin ventricular prematura (CVP) es un tipo frecuente de latidos cardacos irregulares (arritmia). Estas contracciones son latidos cardacos extras que comienzan en los ventrculos del corazn y ocurren demasiado pronto en la secuencia normal. Durante la CVP, no se utiliza la va elctrica normal del corazn y, por eso, el latido es ms corto y IT consultant. En la [employer], estas contracciones [place] y vienen y no requieren TEFL teacher. Cules son las causas? Las causas ms frecuentes de la afeccin [employer] siguientes: Fumar. Consumir alcohol. Ciertos medicamentos. Algunas drogas ilegales. Estrs. Cafena. Ciertas afecciones mdicas tambin pueden provocar CVP: Insuficiencia cardaca. Infarto de miocardio o arteriopata coronaria. Problemas en las vlvulas cardacas. Cambios en los minerales de la sangre (electrolitos). Niveles bajos de oxgeno o niveles altos de dixido de Development worker, international aid. En muchos casos, se desconoce la causa de esta afeccin. Cules son los signos o sntomas? El sntoma principal de esta afeccin son latidos cardacos rpidos o intermitentes (palpitaciones). Otros sntomas pueden incluir los  siguientes: Dolor de [place]. Falta de aire. Cansancio. Mareos. Dificultad para realizar actividad fsica. En algunos casos no hay sntomas. Cmo se diagnostica? Esta afeccin se puede diagnosticar en funcin de lo siguiente: Sus antecedentes mdicos. Un examen fsico. Durante el examen, el mdico controlar si tiene latidos cardacos irregulares. Estudios, como, por ejemplo: Un ECG (electrocardiograma) para controlar la actividad elctrica del corazn. Un monitor cardaco ambulatorio. Este dispositivo registra los latidos cardacos durante 24 horas o ms. Pruebas de esfuerzo para ver cmo el ejercicio afecta el ritmo cardaco y el flujo sanguneo. Un ecocardiograma. Un ecocardiograma [place] ondas sonoras (ultrasonido) para obtener una imagen del corazn. Un estudio electrofisiolgico (EEF). Este estudio se realiza para Tax inspector. Cmo se trata? El tratamiento para esta afeccin depende de las afecciones subyacentes, el tipo de CVP que tiene y en qu medida los sntomas interfieren en su vida cotidiana. Los tratamientos posibles incluyen: Automotive engineer las cosas que causan las contracciones prematuras (desencadenantes). Entre ellas se incluyen la cafena y el alcohol. Usar medicamentos si los sntomas son graves o si los latidos cardacos extras son frecuentes. Recibir tratamiento para las enfermedades subyacentes que ocasionan la CVP. Colocarle un desfibrilador cardioversor implantable (DCI) si corre riesgo de sufrir una arritmia grave. El DCI es un dispositivo pequeo que se inserta en el trax para controlar el latido cardaco. Cuando detecta un latido cardaco irregular, enva una descarga para hacer que el latido cardaco vuelva a la normalidad. Someterse a un procedimiento para destruir la parte del tejido cardaco que enva seales anormales (ablacin por catter). En algunos los casos, no se necesita tratamiento. Siga estas indicaciones en su casa: Estilo de  vida No consuma ningn producto que contenga nicotina o tabaco, como cigarrillos, cigarrillos electrnicos y tabaco de  mascar. Si necesita ayuda para dejar de consumir estos productos, consulte al mdico. No consuma drogas ilegales. Haga ejercicio regularmente. Consulte al mdico qu tipo de ejercicio es seguro para usted. Trate de dormir entre 7 y 9 horas por noche, o segn las recomendaciones del mdico. Busque formas saludables de Charity fundraiser. Cuando sea posible, evite las situaciones estresantes. Consumo de alcohol No beba alcohol si: Su mdico le indica no hacerlo. Est embarazada, puede estar embarazada o est tratando de [place]. El alcohol desencadena los episodios. Si bebe alcohol: Limite la cantidad que bebe: De 0 a 1 medida por da para las mujeres. De 0 a 2 medidas por da para los hombres. Est atento a la cantidad de alcohol que hay en las bebidas que toma. En los [address], una medida equivale a una botella de cerveza de 12 oz (355 ml), un vaso de vino de 5 oz (148 ml) o un vaso de una bebida alcohlica de alta graduacin de 1 oz (44 ml). Indicaciones generales Use los medicamentos de venta libre y los recetados solamente como se lo haya indicado el mdico. Si la cafena desencadena episodios de CVP, no coma, no beba ni consuma nada que contenga cafena. Concurra a todas las visitas de [address] se lo haya indicado el mdico. Esto es importante. Comunquese con un mdico si: Siente palpitaciones. Solicite ayuda de inmediato si: Midwife. Le falta el aire. Berenice Primas sin motivo alguno. Tiene nuseas y vmitos. Se siente mareado o se desmaya. Resumen Una contraccin ventricular prematura (CVP) es un tipo frecuente de latidos cardacos irregulares (arritmia). En la [employer], estas contracciones [place] y vienen y no requieren TEFL teacher. Tal vez deba usar un monitor cardaco ambulatorio. Este [employer] latidos  cardacos durante 24 horas o ms. El tratamiento depende de las afecciones subyacentes, el tipo de CVP que tiene y en qu medida los sntomas interfieren en su vida cotidiana. Esta informacin no tiene Theme park manager el consejo del mdico. Asegrese de hacerle al mdico cualquier pregunta que tenga. Document Revised: 11/19/2020 Document Reviewed: 11/19/2020 Elsevier Patient Education  2023 [employer].

## 2022-02-12 NOTE — Progress Notes (Signed)
Cardiology Office Note:    Date:  02/12/2022   ID:  Felicia Ray, [name] 12-04-1965, MRN [id number]  PCP:  Felicia Manson, MD   Felicia Ray Cardiologist:  Felicia Miss, MD     Referring MD: Felicia Manson, MD   CC: DOD Syncope  History of Present Illness:    Felicia Ray is a 56 y.o. female with a hx of Outflow tract PVCs.  Patient seen as nurse visit for PVCs.  As part of nurse visit, questions of syncope. Added to DOD schedule.  Patient notes that she is doing no differently since starting flecainide.   Since last visit notes that she has chest pinch that does not change with activity over her left side. . There are no interval hospital/ED visit.    No SOB/DOE and no PND/Orthopnea.  No weight gain or leg swelling.  No palpitations (asymptomatic of PVCs).  She past out: she was with her daugther's father standing up and talking during the heart.  Looked pale.  Then passed out, afterwords dizzy but otherwise well.  Past Medical History:  Diagnosis Date   Asthma    Chest pain    Chronic pain syndrome 01/23/2014   Fall by slipping on water at work.  Worker's Comp case.  Followed by Felicia Ray,  and Felicia Ray.  Waiting for second opinion.  Not clear why left low back and knee are not improving.  Called neuropathic pain by Felicia Ray.   Environmental and seasonal allergies    Frequent PVCs    HLD (hyperlipidemia)    PFO (patent foramen ovale) 04/03/2021   Echo with Felicia Ray with predominantly L>R shunt.    Past Surgical History:  Procedure Laterality Date   TUBAL LIGATION  07/20/2005    Current Medications: No outpatient medications have been marked as taking for the 02/12/22 encounter (Appointment) with Felicia Constant, MD.     Allergies:   Aspirin, Dipyrone, Dipyrone, and Other   Social History   Socioeconomic History   Marital status: Single    Spouse name: Not on file   Number of children:  2   Years of education: Not on file   Highest education level: 12th grade  Occupational History   Occupation: Works at [employer]  Tobacco Use   Smoking status: Never   Smokeless tobacco: Never  Vaping Use   Vaping Use: Never used  Substance and Sexual Activity   Alcohol use: Never   Drug use: Never   Sexual activity: Not Currently    Birth control/protection: Surgical  Other Topics Concern   Not on file  Social History Narrative      Originally from [place]   [place]   Divorced husband in [place] due to his alcohol abuse.    He did not abuse her.   LIves at home with her daughter, but no longer with who was a long term boyfriend.  He is father of daughter and they maintain a good relationship.   Social Determinants of Health   Financial Resource Strain: Low Risk  (07/03/2021)   Overall Financial Resource Strain (CARDIA)    Difficulty of Paying Living Expenses: Not hard at all  Food Insecurity: No Food Insecurity (07/03/2021)   Hunger Vital Sign    Worried About Running Out of Food in the Last Year: Never true    Ran Out of Food in the Last Year: Never true  Transportation Needs: No Transportation  Needs (07/03/2021)   PRAPARE - Administrator, Civil Service (Medical): No    Lack of Transportation (Non-Medical): No  Physical Activity: Not on file  Stress: Not on file  Social Connections: Not on file     Family History: The patient's family history includes Asthma in her mother; Cervical cancer in her maternal grandmother; Diabetes in her sister; Heart disease in her mother; Hyperlipidemia in her brother, mother, and sister; Hypertension in her brother, mother, and sister; Obesity in her mother.  ROS:   Please see the history of present illness.     All other systems reviewed and are negative.  EKGs/Labs/Other Studies Reviewed:    The following studies were reviewed today:   EKG:  EKG is  ordered today.  The ekg ordered today demonstrates  SR  with PVCs in the pattern of bigeminy (outflow tract PVCs)  Recent Labs: 02/18/2021: Hemoglobin 14.1; Platelets 239 06/23/2021: BUN 12; Creatinine, Ser 0.78; Potassium 4.6; Sodium 138; TSH 2.960 02/05/2022: ALT 12  Recent Lipid Panel    Component Value Date/Time   CHOL 231 (H) 02/05/2022 0912   TRIG 423 (H) 02/05/2022 0912   HDL 32 (L) 02/05/2022 0912   CHOLHDL 8.3 01/13/2019 0334   VLDL UNABLE TO CALCULATE IF TRIGLYCERIDE OVER 400 mg/dL 54/65/0354 6568   LDLCALC 124 (H) 02/05/2022 0912   LDLDIRECT 82.5 01/13/2019 0334    Physical Exam:    VS:  BP 110/80, HR 70  Wt Readings from Last 3 Encounters:  01/26/22 78.2 kg  12/18/21 76.4 kg  10/02/21 75.8 kg     GEN:  Well nourished, well developed in no acute distress HEENT: Normal NECK: No JVD; No carotid bruits LYMPHATICS: No lymphadenopathy CARDIAC: IRIR, no murmurs, rubs, gallops RESPIRATORY:  Clear to auscultation without rales, wheezing or rhonchi  ABDOMEN: Soft, non-tender, non-distended MUSCULOSKELETAL:  No edema; No deformity  SKIN: Warm and dry NEUROLOGIC:  Alert and oriented x 3 PSYCHIATRIC:  Normal affect   ASSESSMENT:    No diagnosis found.  PLAN:    Syncope In the setting of outflow tract PVCs With atypical chest pain - syncope sounds more related to heat - given frequent PVCs, 1c agent use, and atypical CP, will get Lexiscan - interpretor used: explained what outflow tract PVCs were and gave education on PVCs (Spanish Language)  No change in f/u  This is re-entry from earlier in the day (the wrong encounter was created)  Do not double charge.      Shared Decision Making/Informed Consent The risks [chest pain, shortness of breath, cardiac arrhythmias, dizziness, blood pressure fluctuations, myocardial infarction, stroke/transient ischemic attack, nausea, vomiting, allergic reaction, radiation exposure, metallic taste sensation and life-threatening complications (estimated to be 1 in 10,000)], benefits  (risk stratification, diagnosing coronary artery disease, treatment guidance) and alternatives of a nuclear stress test were discussed in detail with Felicia Ray and she agrees to proceed.    Medication Adjustments/Labs and Tests Ordered: Current medicines are reviewed at length with the patient today.  Concerns regarding medicines are outlined above.  No orders of the defined types were placed in this encounter.  No orders of the defined types were placed in this encounter.   There are no Patient Instructions on file for this visit.    Signed, Felicia Constant, MD  02/12/2022 1:18 PM    [facility]

## 2022-02-12 NOTE — Progress Notes (Signed)
Cardiology Office Note:    Date:  02/12/2022   ID:  Felicia Robinsons Jesus Kimm Sider, [name] 1966-01-28, MRN [id number]  PCP:  Julieanne Manson, MD   [facility] Providers Cardiologist:  Kristeen Miss, MD     Referring MD: Julieanne Manson, MD   CC: DOD Syncope  History of Present Illness:    Felicia Ray is a 56 y.o. female with a hx of Outflow tract PVCs.  Patient seen as nurse visit for PVCs.  As part of nurse visit, questions of syncope. Added to DOD schedule.  Patient notes that she is doing no differently since starting flecainide.   Since last visit notes that she has chest pinch that does not change with activity over her left side. . There are no interval hospital/ED visit.    No SOB/DOE and no PND/Orthopnea.  No weight gain or leg swelling.  No palpitations (asymptomatic of PVCs).  She past out: she was with her daugther's father standing up and talking during the heart.  Looked pale.  Then passed out, afterwords dizzy but otherwise well.  Past Medical History:  Diagnosis Date   Asthma    Chest pain    Chronic pain syndrome 01/23/2014   Fall by slipping on water at work.  Worker's Comp case.  Followed by Dr. Corine Shelter,  and [facility].  Waiting for second opinion.  Not clear why left low back and knee are not improving.  Called neuropathic pain by Dr. Ethelene Hal.   Environmental and seasonal allergies    Frequent PVCs    HLD (hyperlipidemia)    PFO (patent foramen ovale) 04/03/2021   Echo with Dr. Elease Hashimoto with predominantly L>R shunt.    Past Surgical History:  Procedure Laterality Date   TUBAL LIGATION  07/20/2005    Current Medications: No outpatient medications have been marked as taking for the 02/12/22 encounter (Office Visit) with Christell Constant, MD.     Allergies:   Aspirin, Dipyrone, Dipyrone, and Other   Social History   Socioeconomic History   Marital status: Single    Spouse name: Not on file   Number of  children: 2   Years of education: Not on file   Highest education level: 12th grade  Occupational History   Occupation: Works at [employer]  Tobacco Use   Smoking status: Never   Smokeless tobacco: Never  Vaping Use   Vaping Use: Never used  Substance and Sexual Activity   Alcohol use: Never   Drug use: Never   Sexual activity: Not Currently    Birth control/protection: Surgical  Other Topics Concern   Not on file  Social History Narrative      Originally from [place]   [place]   Divorced husband in [place] due to his alcohol abuse.    He did not abuse her.   LIves at home with her daughter, but no longer with who was a long term boyfriend.  He is father of daughter and they maintain a good relationship.   Social Determinants of Health   Financial Resource Strain: Low Risk  (07/03/2021)   Overall Financial Resource Strain (CARDIA)    Difficulty of Paying Living Expenses: Not hard at all  Food Insecurity: No Food Insecurity (07/03/2021)   Hunger Vital Sign    Worried About Running Out of Food in the Last Year: Never true    Ran Out of Food in the Last Year: Never true  Transportation Needs: No  Transportation Needs (07/03/2021)   PRAPARE - Administrator, Civil Service (Medical): No    Lack of Transportation (Non-Medical): No  Physical Activity: Not on file  Stress: Not on file  Social Connections: Not on file     Family History: The patient's family history includes Asthma in her mother; Cervical cancer in her maternal grandmother; Diabetes in her sister; Heart disease in her mother; Hyperlipidemia in her brother, mother, and sister; Hypertension in her brother, mother, and sister; Obesity in her mother.  ROS:   Please see the history of present illness.     All other systems reviewed and are negative.  EKGs/Labs/Other Studies Reviewed:    The following studies were reviewed today:   EKG:  EKG is  ordered today.  The ekg ordered today  demonstrates  SR with PVCs in the pattern of bigeminy (outflow tract PVCs)  Recent Labs: 02/18/2021: Hemoglobin 14.1; Platelets 239 06/23/2021: BUN 12; Creatinine, Ser 0.78; Potassium 4.6; Sodium 138; TSH 2.960 02/05/2022: ALT 12  Recent Lipid Panel    Component Value Date/Time   CHOL 231 (H) 02/05/2022 0912   TRIG 423 (H) 02/05/2022 0912   HDL 32 (L) 02/05/2022 0912   CHOLHDL 8.3 01/13/2019 0334   VLDL UNABLE TO CALCULATE IF TRIGLYCERIDE OVER 400 mg/dL 16/83/7290 2111   LDLCALC 124 (H) 02/05/2022 0912   LDLDIRECT 82.5 01/13/2019 0334    Physical Exam:    VS:  BP 110/80, HR 70  Wt Readings from Last 3 Encounters:  01/26/22 78.2 kg  12/18/21 76.4 kg  10/02/21 75.8 kg     GEN:  Well nourished, well developed in no acute distress HEENT: Normal NECK: No JVD; No carotid bruits LYMPHATICS: No lymphadenopathy CARDIAC: IRIR, no murmurs, rubs, gallops RESPIRATORY:  Clear to auscultation without rales, wheezing or rhonchi  ABDOMEN: Soft, non-tender, non-distended MUSCULOSKELETAL:  No edema; No deformity  SKIN: Warm and dry NEUROLOGIC:  Alert and oriented x 3 PSYCHIATRIC:  Normal affect   ASSESSMENT:    1. Syncope and collapse   2. Frequent PVCs    PLAN:    Syncope In the setting of outflow tract PVCs With atypical chest pain - syncope sounds more related to heat - given frequent PVCs, 1c agent use, and atypical CP, will get Lexiscan - interpretor used: explained what outflow tract PVCs were and gave education on PVCs (Spanish Language)  No change in f/u      Shared Decision Making/Informed Consent The risks [chest pain, shortness of breath, cardiac arrhythmias, dizziness, blood pressure fluctuations, myocardial infarction, stroke/transient ischemic attack, nausea, vomiting, allergic reaction, radiation exposure, metallic taste sensation and life-threatening complications (estimated to be 1 in 10,000)], benefits (risk stratification, diagnosing coronary artery disease,  treatment guidance) and alternatives of a nuclear stress test were discussed in detail with Felicia Ray and she agrees to proceed.    Medication Adjustments/Labs and Tests Ordered: Current medicines are reviewed at length with the patient today.  Concerns regarding medicines are outlined above.  No orders of the defined types were placed in this encounter.  No orders of the defined types were placed in this encounter.   Patient Instructions     Signed, Christell Constant, MD  02/12/2022 12:51 PM    [facility]

## 2022-02-13 ENCOUNTER — Encounter (HOSPITAL_COMMUNITY): Payer: Self-pay | Admitting: *Deleted

## 2022-02-13 ENCOUNTER — Telehealth (HOSPITAL_COMMUNITY): Payer: Self-pay | Admitting: *Deleted

## 2022-02-13 NOTE — Progress Notes (Signed)
IT This is the third encounter for her.  Please only bill the first charge.

## 2022-02-13 NOTE — Telephone Encounter (Signed)
My Chart letter sent outlining instructions for upcoming stress test on 02/23/22.

## 2022-02-23 ENCOUNTER — Ambulatory Visit (HOSPITAL_COMMUNITY): Payer: Self-pay | Attending: Internal Medicine

## 2022-02-23 DIAGNOSIS — R931 Abnormal findings on diagnostic imaging of heart and coronary circulation: Secondary | ICD-10-CM | POA: Insufficient documentation

## 2022-02-23 DIAGNOSIS — I493 Ventricular premature depolarization: Secondary | ICD-10-CM | POA: Insufficient documentation

## 2022-02-23 LAB — MYOCARDIAL PERFUSION IMAGING
LV dias vol: 76 mL (ref 46–106)
LV sys vol: 36 mL
Nuc Stress EF: 52 %
Peak HR: 93 {beats}/min
Rest HR: 75 {beats}/min
Rest Nuclear Isotope Dose: 10.1 mCi
SDS: 1
SRS: 2
SSS: 3
ST Depression (mm): 0 mm
Stress Nuclear Isotope Dose: 30.7 mCi
TID: 0.92

## 2022-02-23 MED ORDER — TECHNETIUM TC 99M TETROFOSMIN IV KIT
10.1000 | PACK | Freq: Once | INTRAVENOUS | Status: AC | PRN
  Administered 2022-02-23: 10.1 via INTRAVENOUS

## 2022-02-23 MED ORDER — TECHNETIUM TC 99M TETROFOSMIN IV KIT
30.7000 | PACK | Freq: Once | INTRAVENOUS | Status: AC | PRN
  Administered 2022-02-23: 30.7 via INTRAVENOUS

## 2022-02-23 MED ORDER — REGADENOSON 0.4 MG/5ML IV SOLN
0.4000 mg | Freq: Once | INTRAVENOUS | Status: AC
  Administered 2022-02-23: 0.4 mg via INTRAVENOUS

## 2022-04-27 ENCOUNTER — Encounter: Payer: Self-pay | Admitting: Cardiology

## 2022-04-27 ENCOUNTER — Ambulatory Visit: Payer: Self-pay | Attending: Cardiology | Admitting: Cardiology

## 2022-04-27 VITALS — BP 112/70 | HR 57 | Ht 59.5 in | Wt 173.2 lb

## 2022-04-27 DIAGNOSIS — I493 Ventricular premature depolarization: Secondary | ICD-10-CM

## 2022-04-27 MED ORDER — FLECAINIDE ACETATE 50 MG PO TABS: 50.0000 mg | ORAL_TABLET | Freq: Two times a day (BID) | ORAL | 3 refills | Status: AC

## 2022-04-27 NOTE — Patient Instructions (Signed)
Medication Instructions:  Your physician recommends that you continue on your current medications as directed. Please refer to the Current Medication list given to you today.  *If you need a refill on your cardiac medications before your next appointment, please call your pharmacy*   Lab Work: None ordered  Testing/Procedures: None ordered   Follow-Up: At [facility], you and your health needs are our priority.  As part of our continuing mission to provide you with exceptional heart care, we have created designated Provider Care Teams.  These Care Teams include your primary Cardiologist (physician) and Advanced Practice Providers (APPs -  Physician Assistants and Nurse Practitioners) who all work together to provide you with the care you need, when you need it.   Your next appointment:   6 month(s)  The format for your next appointment:   In Person  Provider:   You will see one of the following Advanced Practice Providers on your designated Care Team:   Renee Ursuy, PA-C Michael "Andy" Tillery, PA-C    Thank you for choosing [facility]!!   Shaley Leavens, RN [phone]  Other Instructions   Important Information About Sugar           

## 2022-04-27 NOTE — Progress Notes (Signed)
Electrophysiology Office Note   Date:  04/27/2022   ID:  Felicia Ray, Felicia Ray 08-Dec-1965, MRN [id number]  PCP:  Julieanne Manson, MD  Cardiologist:  Nahser Primary Electrophysiologist:  Aodhan Scheidt Jorja Loa, MD    Chief Complaint: PVC   History of Present Illness: Felicia Ray is a 56 y.o. Ray who is being seen today for the evaluation of PVC at the request of Julieanne Manson, MD. Presenting today for electrophysiology evaluation.  She presented to cardiology clinic with palpitations.  Palpitations occur multiple times of the day.  She went to the emergency room with chest pain and dizziness and was found to have an elevated PVC burden.  She wore a cardiac monitor that showed a 23% burden.  She is currently on flecainide.  Today, denies symptoms of palpitations, chest pain, shortness of breath, orthopnea, PND, lower extremity edema, claudication, dizziness, presyncope, syncope, bleeding, or neurologic sequela. The patient is tolerating medications without difficulties.     Past Medical History:  Diagnosis Date   Asthma    Chest pain    Chronic pain syndrome 01/23/2014   Fall by slipping on water at work.  Worker's Comp case.  Followed by Dr. Corine Shelter,  and [facility].  Waiting for second opinion.  Not clear why left low back and knee are not improving.  Called neuropathic pain by Dr. Ethelene Hal.   Environmental and seasonal allergies    Frequent PVCs    HLD (hyperlipidemia)    PFO (patent foramen ovale) 04/03/2021   Echo with Dr. Elease Hashimoto with predominantly L>R shunt.   Past Surgical History:  Procedure Laterality Date   TUBAL LIGATION  07/20/2005     Current Outpatient Medications  Medication Sig Dispense Refill   albuterol (VENTOLIN HFA) 108 (90 Base) MCG/ACT inhaler Inhale 1-2 puffs into the lungs every 6 (six) hours as needed for wheezing or shortness of breath. 8 g 1   atorvastatin (LIPITOR) 40 MG tablet 1 tab by mouth with evening  meal daily. 30 tablet 11   metoprolol tartrate (LOPRESSOR) 25 MG tablet Take 0.5 tablets (12.5 mg total) by mouth 2 (two) times daily. 90 tablet 3   Omega-3 Fatty Acids (FISH OIL PO) Take 1 capsule by mouth daily.     flecainide (TAMBOCOR) 50 MG tablet Take 1 tablet (50 mg total) by mouth 2 (two) times daily. 60 tablet 3   No current facility-administered medications for this visit.    Allergies:   Aspirin, Dipyrone, Dipyrone, and Other   Social History:  The patient  reports that she has never smoked. She has never used smokeless tobacco. She reports that she does not drink alcohol and does not use drugs.   Family History:  The patient's family history includes Asthma in her mother; Cervical cancer in her maternal grandmother; Diabetes in her sister; Heart disease in her mother; Hyperlipidemia in her brother, mother, and sister; Hypertension in her brother, mother, and sister; Obesity in her mother.   ROS:  Please see the history of present illness.   Otherwise, review of systems is positive for none.   All other systems are reviewed and negative.   PHYSICAL EXAM: VS:  BP 112/70   Pulse (!) 57   Ht 4' 11.5" (1.511 m)   Wt 173 lb 3.2 oz (78.6 kg)   LMP  (LMP Unknown)   SpO2 95%   BMI 34.40 kg/m  , BMI Body mass index is 34.4 kg/m. GEN: Well nourished, well developed,  in no acute distress  HEENT: normal  Neck: no JVD, carotid bruits, or masses Cardiac: RRR; no murmurs, rubs, or gallops,no edema  Respiratory:  clear to auscultation bilaterally, normal work of breathing GI: soft, nontender, nondistended, + BS MS: no deformity or atrophy  Skin: warm and dry Neuro:  Strength and sensation are intact Psych: euthymic mood, full affect  EKG:  EKG is ordered today. Personal review of the ekg ordered shows sinus rhythm   Recent Labs: 06/23/2021: BUN 12; Creatinine, Ser 0.78; Potassium 4.6; Sodium 138; TSH 2.960 02/05/2022: ALT 12    Lipid Panel     Component Value Date/Time   CHOL  231 (H) 02/05/2022 0912   TRIG 423 (H) 02/05/2022 0912   HDL 32 (L) 02/05/2022 0912   CHOLHDL 8.3 01/13/2019 0334   VLDL UNABLE TO CALCULATE IF TRIGLYCERIDE OVER 400 mg/dL 24/82/5003 7048   LDLCALC 124 (H) 02/05/2022 0912   LDLDIRECT 82.5 01/13/2019 0334     Wt Readings from Last 3 Encounters:  04/27/22 173 lb 3.2 oz (78.6 kg)  02/23/22 172 lb (78 kg)  01/26/22 172 lb 6.4 oz (78.2 kg)      Other studies Reviewed: Additional studies/ records that were reviewed today include: TTE 12/01/21  Review of the above records today demonstrates:   1. Left ventricular ejection fraction, by estimation, is 50 to 55%. Left  ventricular ejection fraction by 3D volume is 50 %. The left ventricle has  low normal function. Left ventricular endocardial border not optimally  defined to evaluate regional wall  motion. Left ventricular diastolic parameters are consistent with Grade I  diastolic dysfunction (impaired relaxation).   2. Right ventricular systolic function is normal. The right ventricular  size is normal. There is normal pulmonary artery systolic pressure. The  estimated right ventricular systolic pressure is 21.0 mmHg.   3. The mitral valve is normal in structure. Trivial mitral valve  regurgitation. No evidence of mitral stenosis.   4. The aortic valve is tricuspid. Aortic valve regurgitation is trivial.  No aortic stenosis is present.   5. The inferior vena cava is normal in size with greater than 50%  respiratory variability, suggesting right atrial pressure of 3 mmHg.   Myoview 02/23/2022   The study is normal. The study is low risk.   No ST deviation was noted.   LV perfusion is normal.   Left ventricular function is normal. End diastolic cavity size is normal. End systolic cavity size is normal.   Prior study not available for comparison.    Cardiac monitor 04/29/2021 personally reviewed Sinus rhythm Frequent PVC , frequently in a bigeminal or trigeminal pattern 23%  ventricular ectopy burden  ASSESSMENT AND PLAN:  1.  PVCs: Has an elevated burden at 23%.  Currently on flecainide 50 mg twice daily.  He is feeling more improved with less palpitations.  She is overall quite happy with her control.  QRS remains narrow.  High risk medication monitoring for flecainide.  2.  Obesity: Diet and exercise encouraged Body mass index is 34.4 kg/m.  Current medicines are reviewed at length with the patient today.   The patient does not have concerns regarding her medicines.  The following changes were made today: None  Labs/ tests ordered today include:  Orders Placed This Encounter  Procedures   EKG 12-Lead     Disposition:   FU 6 months  Signed, Jahleel Stroschein Jorja Loa, MD  04/27/2022 10:10 AM     [facility] [address]  [address] [place] [address] [phone] (office) [phone] (fax)

## 2022-05-05 ENCOUNTER — Other Ambulatory Visit: Payer: Self-pay

## 2022-05-05 ENCOUNTER — Emergency Department (HOSPITAL_COMMUNITY)
Admission: EM | Admit: 2022-05-05 | Discharge: 2022-05-05 | Disposition: A | Discharge status: Home / Self Care | Payer: Self-pay | Attending: Emergency Medicine | Admitting: Emergency Medicine

## 2022-05-05 DIAGNOSIS — Z7951 Long term (current) use of inhaled steroids: Secondary | ICD-10-CM | POA: Insufficient documentation

## 2022-05-05 DIAGNOSIS — Z79899 Other long term (current) drug therapy: Secondary | ICD-10-CM | POA: Insufficient documentation

## 2022-05-05 DIAGNOSIS — J45909 Unspecified asthma, uncomplicated: Secondary | ICD-10-CM | POA: Insufficient documentation

## 2022-05-05 DIAGNOSIS — T7840XA Allergy, unspecified, initial encounter: Secondary | ICD-10-CM | POA: Insufficient documentation

## 2022-05-05 DIAGNOSIS — L298 Other pruritus: Secondary | ICD-10-CM | POA: Insufficient documentation

## 2022-05-05 MED ORDER — DIPHENHYDRAMINE HCL 25 MG PO CAPS
25.0000 mg | ORAL_CAPSULE | Freq: Once | ORAL | Status: AC
  Administered 2022-05-05: 25 mg via ORAL
  Filled 2022-05-05: qty 1

## 2022-05-05 MED ORDER — PREDNISONE 20 MG PO TABS
60.0000 mg | ORAL_TABLET | Freq: Once | ORAL | Status: AC
  Administered 2022-05-05: 60 mg via ORAL
  Filled 2022-05-05: qty 3

## 2022-05-05 MED ORDER — FAMOTIDINE 20 MG PO TABS
20.0000 mg | ORAL_TABLET | Freq: Once | ORAL | Status: AC
  Administered 2022-05-05: 20 mg via ORAL
  Filled 2022-05-05: qty 1

## 2022-05-05 MED ORDER — METHYLPREDNISOLONE 4 MG PO TBPK: ORAL_TABLET | ORAL | 0 refills | Status: DC

## 2022-05-05 NOTE — ED Provider Notes (Signed)
[facility] EMERGENCY DEPARTMENT Provider Note   CSN: [id number] Arrival date & time: 05/05/22  6962     History  Chief Complaint  Patient presents with   Allergic Reaction   Pruritis   Rash    Felicia Ray is a 56 y.o. female.  Patient with history of asthma and hyperlipidemia presents today with complaints of allergic reaction.  She states that she took ibuprofen this morning and approximately 30 minutes prior to arrival she developed a rash on her bilateral upper and lower extremities that is extremely itchy.  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  Endorses significant pruritus specifically on her arms.  Denies any trouble breathing, trouble swallowing, swelling in the mouth, or swelling in the lips.  No chest pain or shortness of breath.   Allergic Reaction Presenting symptoms: rash   Rash      Home Medications Prior to Admission medications   Medication Sig Start Date End Date Taking? Authorizing Provider  albuterol (VENTOLIN HFA) 108 (90 Base) MCG/ACT inhaler Inhale 1-2 puffs into the lungs every 6 (six) hours as needed for wheezing or shortness of breath. 07/06/21   Prosperi, Christian H, PA-C  atorvastatin (LIPITOR) 40 MG tablet 1 tab by mouth with evening meal daily. 12/18/21   Julieanne Manson, MD  flecainide (TAMBOCOR) 50 MG tablet Take 1 tablet (50 mg total) by mouth 2 (two) times daily. 04/27/22   Camnitz, Andree Coss, MD  metoprolol tartrate (LOPRESSOR) 25 MG tablet Take 0.5 tablets (12.5 mg total) by mouth 2 (two) times daily. 05/05/21   Nahser, Deloris Ping, MD  Omega-3 Fatty Acids (FISH OIL PO) Take 1 capsule by mouth daily.    [provider]  cetirizine (ZYRTEC) 10 MG tablet Take 1 tablet (10 mg total) by mouth daily. Patient not taking: Reported on 02/09/2019 08/04/18 07/26/19  Julieanne Manson, MD      Allergies    Aspirin, Dipyrone, Dipyrone, and Other    Review of Systems    Review of Systems  Skin:  Positive for rash.  All other systems reviewed and are negative.   Physical Exam Updated Vital Signs BP (!) 110/91 (BP Location: Left Arm)   Pulse 92   Temp (!) 97.5 F (36.4 C) (Oral)   Resp 18   LMP  (LMP Unknown)   SpO2 98%  Physical Exam Vitals and nursing note reviewed.  Constitutional:      General: She is not in acute distress.    Appearance: Normal appearance. She is normal weight. She is not ill-appearing, toxic-appearing or diaphoretic.  HENT:     Head: Normocephalic and atraumatic.     Mouth/Throat:     Mouth: Mucous membranes are moist.     Comments: No swelling to the oropharynx or lips.  No lesions in the mouth. Eyes:     Extraocular Movements: Extraocular movements intact.     Pupils: Pupils are equal, round, and reactive to light.     Comments: No periorbital swelling  Cardiovascular:     Rate and Rhythm: Normal rate and regular rhythm.     Heart sounds: Normal heart sounds.  Pulmonary:     Effort: Pulmonary effort is normal. No respiratory distress.     Breath sounds: Normal breath sounds.  Abdominal:     General: Abdomen is flat.     Palpations: Abdomen is soft.  Musculoskeletal:        General: Normal range of motion.  Cervical back: Normal range of motion.  Skin:    General: Skin is warm and dry.     Comments: Hives with excoriations present to bilateral upper and lower extremities.  Neurological:     General: No focal deficit present.     Mental Status: She is alert.  Psychiatric:        Mood and Affect: Mood normal.        Behavior: Behavior normal.     ED Results / Procedures / Treatments   Labs (all labs ordered are listed, but only abnormal results are displayed) Labs Reviewed - No data to display  EKG None  Radiology No results found.  Procedures Procedures    Medications Ordered in ED Medications  predniSONE (DELTASONE) tablet 60 mg (60 mg Oral Given 05/05/22 0944)  famotidine (PEPCID)  tablet 20 mg (20 mg Oral Given 05/05/22 0944)  diphenhydrAMINE (BENADRYL) capsule 25 mg (25 mg Oral Given 05/05/22 0944)    ED Course/ Medical Decision Making/ A&P                           Medical Decision Making Risk Prescription drug management.   Patient presents today with complaints of allergic reaction.  She is afebrile, nontoxic-appearing, and in no acute distress with reassuring vital signs.  Physical exam reveals hives on bilateral upper and lower extremities but no airway involvement or lip swelling or lesions in the oropharynx.  Patient given prednisone, Pepcid, and Benadryl with complete resolution of her symptoms.  Will continue to monitor.  After 2 hours of monitoring in the emergency department today, patient continues to be completely asymptomatic after administration of medications.  She continues to deny any airway involvement safe to be discharged.  Will send with home prescriptions for steroids and recommend Benadryl for any subsequent itching.  Will emphasize importance of avoiding ibuprofen in the future.  Patient is understanding and amenable with plan, educated on red flag symptoms that would prompt a return.  Patient discharged in stable condition.   Final Clinical Impression(s) / ED Diagnoses Final diagnoses:  Allergic reaction, initial encounter    Rx / DC Orders ED Discharge Orders          Ordered    methylPREDNISolone (MEDROL DOSEPAK) 4 MG TBPK tablet        05/05/22 1051          An After Visit Summary was printed and given to the patient.     Silva Bandy, PA-C 05/05/22 1052    Melene Plan, DO 05/05/22 1151

## 2022-05-05 NOTE — Discharge Instructions (Signed)
I have given you a prescription for a steroid pack for you to take as prescribed in its entirety for management of your symptoms.  You may also take Benadryl which you can get over-the-counter for any additional itching.  Follow-up with your primary care doctor as needed for any additional health needs.  I recommend avoiding ibuprofen in the future as it is likely the source of your allergy.  Return if development of any new or worsening symptoms.

## 2022-05-05 NOTE — ED Triage Notes (Signed)
Pt./translator Stated, itching, rash for 30 minutes ago. No problem swallowing

## 2022-05-05 NOTE — ED Provider Triage Note (Signed)
Emergency Medicine Provider Triage Evaluation Note  Hart Robinsons Felicia Ray , a 56 y.o. female  was evaluated in triage.  Pt complains of allergic reaction. States that same began 30 minutes ago after she took ibuprofen. 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 Endorses significant pruritus throughout with hives that are worse on her arms. Denies any trouble breathing or swallowing or swelling in her mouth or lips.    Review of Systems  Positive:  Negative:   Physical Exam  BP (!) 110/91 (BP Location: Left Arm)   Pulse 92   Temp (!) 97.5 F (36.4 C) (Oral)   Resp 18   LMP  (LMP Unknown)   SpO2 98%  Gen:   Awake, no distress   Resp:  Normal effort  MSK:   Moves extremities without difficulty  Other:  No lip swelling or lesions in the mouth. Hives on bilateral upper extremities  Medical Decision Making  Medically screening exam initiated at 9:41 AM.  Appropriate orders placed.  Hart Robinsons Felicia Emmett Arntz was informed that the remainder of the evaluation will be completed by another provider, this initial triage assessment does not replace that evaluation, and the importance of remaining in the ED until their evaluation is complete.  Given pepcid benadryl and prednisone   Silva Bandy, PA-C 05/05/22 0945

## 2022-07-13 ENCOUNTER — Encounter: Payer: Self-pay | Admitting: Internal Medicine

## 2022-08-07 ENCOUNTER — Other Ambulatory Visit: Payer: Self-pay

## 2022-08-07 ENCOUNTER — Encounter (HOSPITAL_COMMUNITY): Payer: Self-pay

## 2022-08-07 ENCOUNTER — Emergency Department (HOSPITAL_COMMUNITY): Payer: Self-pay

## 2022-08-07 ENCOUNTER — Emergency Department (HOSPITAL_COMMUNITY)
Admission: EM | Admit: 2022-08-07 | Discharge: 2022-08-08 | Disposition: A | Discharge status: Home / Self Care | Payer: Self-pay | Attending: Emergency Medicine | Admitting: Emergency Medicine

## 2022-08-07 DIAGNOSIS — R079 Chest pain, unspecified: Secondary | ICD-10-CM | POA: Insufficient documentation

## 2022-08-07 DIAGNOSIS — M79661 Pain in right lower leg: Secondary | ICD-10-CM | POA: Insufficient documentation

## 2022-08-07 DIAGNOSIS — R519 Headache, unspecified: Secondary | ICD-10-CM | POA: Insufficient documentation

## 2022-08-07 DIAGNOSIS — Z7982 Long term (current) use of aspirin: Secondary | ICD-10-CM | POA: Insufficient documentation

## 2022-08-07 DIAGNOSIS — Z79899 Other long term (current) drug therapy: Secondary | ICD-10-CM | POA: Insufficient documentation

## 2022-08-07 DIAGNOSIS — M79604 Pain in right leg: Secondary | ICD-10-CM

## 2022-08-07 NOTE — ED Triage Notes (Signed)
Pt reports right sided chest pain associated with shortness of breath and pain in her entire right leg. The chest pain started tonight at 2215 and the leg pain started this morning around 0700. + nausea

## 2022-08-07 NOTE — ED Notes (Signed)
Patient transported to X-ray 

## 2022-08-08 LAB — CBC
HCT: 38.9 % (ref 36.0–46.0)
Hemoglobin: 13.9 g/dL (ref 12.0–15.0)
MCH: 31.2 pg (ref 26.0–34.0)
MCHC: 35.7 g/dL (ref 30.0–36.0)
MCV: 87.2 fL (ref 80.0–100.0)
Platelets: 215 10*3/uL (ref 150–400)
RBC: 4.46 MIL/uL (ref 3.87–5.11)
RDW: 13.3 % (ref 11.5–15.5)
WBC: 6.8 10*3/uL (ref 4.0–10.5)
nRBC: 0 % (ref 0.0–0.2)

## 2022-08-08 LAB — BASIC METABOLIC PANEL
Anion gap: 10 (ref 5–15)
BUN: 19 mg/dL (ref 6–20)
CO2: 21 mmol/L — ABNORMAL LOW (ref 22–32)
Calcium: 9.1 mg/dL (ref 8.9–10.3)
Chloride: 104 mmol/L (ref 98–111)
Creatinine, Ser: 0.62 mg/dL (ref 0.44–1.00)
GFR, Estimated: >60 mL/min (ref >60)
Glucose, Bld: 101 mg/dL — ABNORMAL HIGH (ref 70–99)
Potassium: 3.6 mmol/L (ref 3.5–5.1)
Sodium: 135 mmol/L (ref 135–145)

## 2022-08-08 LAB — I-STAT BETA HCG BLOOD, ED (MC, WL, AP ONLY): I-stat hCG, quantitative: 12.5 m[IU]/mL — ABNORMAL HIGH (ref <5)

## 2022-08-08 LAB — TROPONIN I (HIGH SENSITIVITY)
Troponin I (High Sensitivity): 3 ng/L (ref <18)
Troponin I (High Sensitivity): 3 ng/L (ref <18)

## 2022-08-08 MED ORDER — CYCLOBENZAPRINE HCL 10 MG PO TABS: 10.0000 mg | ORAL_TABLET | Freq: Two times a day (BID) | ORAL | 0 refills | Status: DC | PRN

## 2022-08-08 NOTE — ED Provider Notes (Signed)
[facility] Provider Note   CSN: [id number] Arrival date & time: 08/07/22  2328     History  Chief Complaint  Patient presents with   Chest Pain    Felicia Ray is a 57 y.o. female.  57 year old female presents with complaint of pain on the right chest which moved to mouth then right side of head, felt like loss of hearing in the ear, described as a twisting pain. Onset around 7pm last night, pain was brief and resolved. Pain returned an hour later in the chest, face also feels very hot when having pain. Pain is intermittent, started 1 month ago at work, resolved and didn't have any further pain until tonight. Reports 4-5 episodes of pain since onset. Nothing makes pain better or worse, no relief with lying down, nothing identifiable triggers the pain. Also notes pain in the right leg anterior and lateral thigh, radiates to calf, onset 10 days ago, worse today, leg pain is worse with transitioning from sitting to standing, improves after taking about 10 steps, returns when sitting. No falls or injuries. Intermittent nausea, denies SHOB.  Denies abdominal pain, no changes in bowel or bladder habits, vomiting. Non smoker, no recent extended travel. No personal or family history of PE, DVT.  No falls or injuries.   A language interpreter was used ([place]).  Chest Pain      Home Medications Prior to Admission medications   Medication Sig Start Date End Date Taking? Authorizing Provider  cyclobenzaprine (FLEXERIL) 10 MG tablet Take 1 tablet (10 mg total) by mouth 2 (two) times daily as needed for muscle spasms. 08/08/22  Yes Tacy Learn, PA-C  albuterol (VENTOLIN HFA) 108 (90 Base) MCG/ACT inhaler Inhale 1-2 puffs into the lungs every 6 (six) hours as needed for wheezing or shortness of breath. 07/06/21   Prosperi, Christian H, PA-C  atorvastatin (LIPITOR) 40 MG tablet 1 tab by mouth with evening meal daily. 12/18/21   Mack Hook, MD  flecainide (TAMBOCOR) 50 MG tablet Take 1 tablet (50 mg total) by mouth 2 (two) times daily. 04/27/22   Camnitz, Will Hassell Done, MD  methylPREDNISolone (MEDROL DOSEPAK) 4 MG TBPK tablet Take as directed on package 05/05/22   Smoot, Leary Roca, PA-C  metoprolol tartrate (LOPRESSOR) 25 MG tablet Take 0.5 tablets (12.5 mg total) by mouth 2 (two) times daily. 05/05/21   Nahser, Wonda Cheng, MD  Omega-3 Fatty Acids (FISH OIL PO) Take 1 capsule by mouth daily.    [provider]  cetirizine (ZYRTEC) 10 MG tablet Take 1 tablet (10 mg total) by mouth daily. Patient not taking: Reported on 02/09/2019 08/04/18 07/26/19  Mack Hook, MD      Allergies    Aspirin, Dipyrone, Dipyrone, and Other    Review of Systems   Review of Systems  Cardiovascular:  Positive for chest pain.  Negative except as per HPI  Physical Exam Updated Vital Signs BP 113/71 (BP Location: Left Arm)   Pulse 65   Temp 97.7 F (36.5 C) (Oral)   Resp 16   Ht 5' (1.524 m)   Wt 78 kg   LMP  (LMP Unknown)   SpO2 100%   BMI 33.58 kg/m  Physical Exam Vitals and nursing note reviewed.  Constitutional:      General: She is not in acute distress.    Appearance: She is well-developed. She is not diaphoretic.  HENT:     Head: Normocephalic and  atraumatic.     Right Ear: Tympanic membrane and ear canal normal.     Left Ear: Tympanic membrane and ear canal normal.  Neck:     Thyroid: No thyromegaly.     Vascular: No carotid bruit.  Cardiovascular:     Rate and Rhythm: Normal rate and regular rhythm.     Heart sounds: Normal heart sounds. No murmur heard. Pulmonary:     Effort: Pulmonary effort is normal.     Breath sounds: Normal breath sounds.  Chest:     Chest wall: No tenderness.  Abdominal:     Palpations: Abdomen is soft.     Tenderness: There is no abdominal tenderness.  Musculoskeletal:     Cervical back: Neck supple. No muscular tenderness. Normal range of motion.       Back:     Right  lower leg: No tenderness. No edema.     Left lower leg: No tenderness. No edema.       Legs:     Comments: No lower extremity swelling, DP pulses present, sensation intact, strength equal.   Skin:    General: Skin is warm and dry.     Findings: No erythema or rash.  Neurological:     Mental Status: She is alert and oriented to person, place, and time.  Psychiatric:        Behavior: Behavior normal.     ED Results / Procedures / Treatments   Labs (all labs ordered are listed, but only abnormal results are displayed) Labs Reviewed  BASIC METABOLIC PANEL - Abnormal; Notable for the following components:      Result Value   CO2 21 (*)    Glucose, Bld 101 (*)    All other components within normal limits  I-STAT BETA HCG BLOOD, ED ([facility], [facility], [facility] ONLY) - Abnormal; Notable for the following components:   I-stat hCG, quantitative 12.5 (*)    All other components within normal limits  CBC  TROPONIN I (HIGH SENSITIVITY)  TROPONIN I (HIGH SENSITIVITY)    EKG EKG Interpretation  Date/Time:  Friday August 07 2022 23:17:20 EST Ventricular Rate:  67 PR Interval:  148 QRS Duration: 76 QT Interval:  420 QTC Calculation: 443 R Axis:   17 Text Interpretation: Normal sinus rhythm Normal ECG When compared with ECG of 05-Jul-2021 21:53, PREVIOUS ECG IS PRESENT Confirmed by Merrily Pew [phone]) on 08/08/2022 1:15:53 AM  Radiology DG Chest 2 View  Result Date: 08/07/2022 CLINICAL DATA:  Chest pain. Right-sided pain with shortness of breath. EXAM: CHEST - 2 VIEW COMPARISON:  07/05/2021 FINDINGS: The cardiomediastinal contours are normal. Mild chronic eventration of right hemidiaphragm. Pulmonary vasculature is normal. No consolidation, pleural effusion, or pneumothorax. No acute osseous abnormalities are seen. IMPRESSION: No acute chest finding. Electronically Signed   By: Keith Rake M.D.   On: 08/07/2022 23:59    Procedures Procedures    Medications Ordered in ED Medications - No  data to display  ED Course/ Medical Decision Making/ A&P                             Medical Decision Making Amount and/or Complexity of Data Reviewed Labs: ordered. Radiology: ordered.   This patient presents to the ED for concern of chest pain, headache, leg pain, this involves an extensive number of treatment options, and is a complaint that carries with it a high risk of complications and morbidity.  The differential diagnosis includes  but not limited to ACS, eustachian tube dysfunction, PNA, PE, musculoskeletal pain   Co morbidities that complicate the patient evaluation  Hyperlipidemia, migraines, palpitations, PFO   Additional history obtained:  External records from outside source obtained and reviewed including prior visit to cardiology dates 04/27/22 for PVCs, PFO   Lab Tests:  I Ordered, and personally interpreted labs.  The pertinent results include:  troponin x 2 negative, CBC and BMP without significant findings.    Imaging Studies ordered:  I ordered imaging studies including CXR  I independently visualized and interpreted imaging which showed no acute findings I agree with the radiologist interpretation   Cardiac Monitoring: / EKG:  The patient was maintained on a cardiac monitor.  I personally viewed and interpreted the cardiac monitored which showed an underlying rhythm of: NSR rate 67   Problem List / ED Course / Critical interventions / Medication management  57yo female with concern as above. No chest wall tenderness, Tms normal, abd soft and non tender. No lower ext edema, calfs non tender, no palpable cord. Pain in right leg is found to involve the anterior/lateral thigh and right lower back, suggests msk source, doubt DVT. Work up is reassuring, will trial Flexeril for her back and leg pain (avoid NSAIDs due to anaphylaxis to ASA). Recommend recheck with PCP.  I have reviewed the patients home medicines and have made adjustments as  needed   Social Determinants of Health:  Has PCP   Test / Admission - Considered:  Stable for dc to follow up with PCP.         Final Clinical Impression(s) / ED Diagnoses Final diagnoses:  Chest pain, unspecified type  Pain of right lower extremity    Rx / DC Orders ED Discharge Orders          Ordered    cyclobenzaprine (FLEXERIL) 10 MG tablet  2 times daily PRN        08/08/22 0438              Tacy Learn, PA-C 08/08/22 0451    Mesner, Corene Cornea, MD 08/08/22 [id number]

## 2022-10-01 ENCOUNTER — Encounter: Payer: Self-pay | Admitting: Internal Medicine

## 2022-11-26 ENCOUNTER — Encounter: Payer: Self-pay | Admitting: Internal Medicine

## 2022-11-26 ENCOUNTER — Ambulatory Visit: Payer: Self-pay | Admitting: Internal Medicine

## 2022-11-26 VITALS — BP 122/80 | HR 64 | Resp 16 | Ht 59.5 in | Wt 176.0 lb

## 2022-11-26 DIAGNOSIS — Z6835 Body mass index (BMI) 35.0-35.9, adult: Secondary | ICD-10-CM

## 2022-11-26 DIAGNOSIS — E782 Mixed hyperlipidemia: Secondary | ICD-10-CM

## 2022-11-26 DIAGNOSIS — Z1231 Encounter for screening mammogram for malignant neoplasm of breast: Secondary | ICD-10-CM

## 2022-11-26 DIAGNOSIS — Z599 Problem related to housing and economic circumstances, unspecified: Secondary | ICD-10-CM

## 2022-11-26 DIAGNOSIS — Z0000 Encounter for general adult medical examination without abnormal findings: Secondary | ICD-10-CM

## 2022-11-26 DIAGNOSIS — R252 Cramp and spasm: Secondary | ICD-10-CM

## 2022-11-26 NOTE — Progress Notes (Signed)
Subjective:    Patient ID: Felicia Ray, female   DOB: May 23, 1966, 57 y.o.   MRN: [id number]   HPI  Tereasa Coop interprets  CPE without pap  1.  Pap:  Last 2022 and normal.    2.  Mammogram: Did not obtain last year.  Last diagnostic mammogram was 2022 with normal findings.  No family history of breast cancer.  3.  Osteoprevention:  2 servings of cheese daily.  One serving of almond milk daily.  Walks her dog daily.  4.  Guaiac Cards/FIT:  Last checked 2021 and negative for blood.  5.  Colonoscopy:  Never.  No family history of colon cancer.    6.  Immunizations:  Refuses COVID vaccine as had some dizziness and chest pain.  States she had with each of the 3 vaccines.  Immunization History  Administered Date(s) Administered   Influenza,inj,Quad PF,6+ Mos 03/26/2016, 04/04/2018   Influenza-Unspecified 04/14/2016, 03/30/2019, 03/17/2021, 03/17/2022   PFIZER(Purple Top)SARS-COV-2 Vaccination 08/25/2019, 09/15/2019, 04/13/2020   Pneumococcal Polysaccharide-23 01/13/2019   Tdap 02/20/2016     7.  Glucose/Cholesterol:  Glucose borderline in Feb at 101. Hyperlipidemia with high triglycerides last checked in August of 2023 She is not taking her statin.  States she cannot afford. Lipid Panel     Component Value Date/Time   CHOL 231 (H) 02/05/2022 0912   TRIG 423 (H) 02/05/2022 0912   HDL 32 (L) 02/05/2022 0912   CHOLHDL 8.3 01/13/2019 0334   VLDL UNABLE TO CALCULATE IF TRIGLYCERIDE OVER 400 mg/dL 40/98/1191 4782   LDLCALC 124 (H) 02/05/2022 0912   LDLDIRECT 82.5 01/13/2019 0334   LABVLDL 75 (H) 02/05/2022 0912   .   8.  Other:  not going to cardiology as sent to collections.  States she was told she does not qualify for financial assistance at [facility].   States she was given a 50% discount with the orange card and that is all the discount she gets.   Stopped all her meds in Feb due to inability to afford.   Also, in financial straights as mother was dying in  [place] and had to send money for her care.  Mother died last month.    Current Meds  Medication Sig   albuterol (VENTOLIN HFA) 108 (90 Base) MCG/ACT inhaler Inhale 1-2 puffs into the lungs every 6 (six) hours as needed for wheezing or shortness of breath.   Allergies  Allergen Reactions   Aspirin Anaphylaxis    "Almost died after she was given this- felt like a heart attack"   Dipyrone Anaphylaxis and Hives    "Almost died after she was given this- felt like a heart attack" (also)   Dipyrone Hives   Other     metamizole - blindness, n/v (an international drug)   Past Medical History:  Diagnosis Date   Asthma    Chest pain    Chronic pain syndrome 01/23/2014   Fall by slipping on water at work.  Worker's Comp case.  Followed by Dr. Corine Shelter,  and [facility].  Waiting for second opinion.  Not clear why left low back and knee are not improving.  Called neuropathic pain by Dr. Ethelene Hal.   Environmental and seasonal allergies    Frequent PVCs    HLD (hyperlipidemia)    PFO (patent foramen ovale) 04/03/2021   Echo with Dr. Elease Hashimoto with predominantly L>R shunt.   Past Surgical History:  Procedure Laterality Date   TUBAL LIGATION  07/20/2005  Family History  Problem Relation Age of Onset   Cancer Mother 10       Gastric cancer   Hypertension Mother    Asthma Mother    Hyperlipidemia Mother    Obesity Mother    Heart disease Mother        CHF by description   Diabetes Sister    Hypertension Sister    Hyperlipidemia Sister    Hypertension Brother    Hyperlipidemia Brother    Cervical cancer Maternal Grandmother    Social History   Socioeconomic History   Marital status: Single    Spouse name: Not on file   Number of children: 2   Years of education: Not on file   Highest education level: 12th grade  Occupational History   Occupation: Works at [employer]  Tobacco Use   Smoking status: Never    Passive exposure: Never   Smokeless tobacco: Never  Vaping Use    Vaping Use: Never used  Substance and Sexual Activity   Alcohol use: Never   Drug use: Never   Sexual activity: Not Currently    Birth control/protection: Surgical  Other Topics Concern   Not on file  Social History Narrative      Originally from [place]   [place]   Divorced husband in [place] due to his alcohol abuse.    He did not abuse her.   LIves at home with her daughter and younger son, but no longer with who was a long term boyfriend.     Father of daughter developed alcohol issues and does not help with financial support..   Father of her son was deported    Social Determinants of Health   Financial Resource Strain: High Risk (11/26/2022)   Overall Financial Resource Strain (CARDIA)    Difficulty of Paying Living Expenses: Hard  Food Insecurity: No Food Insecurity (07/03/2021)   Hunger Vital Sign    Worried About Running Out of Food in the Last Year: Never true    Ran Out of Food in the Last Year: Never true  Transportation Needs: No Transportation Needs (11/26/2022)   PRAPARE - Administrator, Civil Service (Medical): No    Lack of Transportation (Non-Medical): No  Physical Activity: Not on file  Stress: Not on file  Social Connections: Not on file  Intimate Partner Violence: Not on file      Review of Systems  Respiratory:  Negative for shortness of breath.   Cardiovascular:  Negative for chest pain, palpitations and leg swelling.  Musculoskeletal:        Muscle cramps of legs and back.   Feels she is staying hydrated.      Objective:   BP 122/80 (BP Location: Right Arm, Patient Position: Sitting, Cuff Size: Normal)   Pulse 64   Resp 16   Ht 4' 11.5" (1.511 m)   Wt 176 lb (79.8 kg)   LMP  (LMP Unknown)   BMI 34.95 kg/m   Physical Exam Constitutional:      Appearance: She is obese.  HENT:     Head: Normocephalic and atraumatic.     Right Ear: Tympanic membrane, ear canal and external ear normal.     Left Ear: Tympanic  membrane, ear canal and external ear normal.     Nose: Nose normal.     Mouth/Throat:     Mouth: Mucous membranes are moist.     Pharynx: Oropharynx is clear.  Eyes:  Extraocular Movements: Extraocular movements intact.     Pupils: Pupils are equal, round, and reactive to light.     Comments: Discs sharp  Neck:     Thyroid: No thyroid mass or thyromegaly.  Cardiovascular:     Rate and Rhythm: Normal rate and regular rhythm.     Heart sounds: S1 normal and S2 normal. No murmur heard.    No friction rub. No S3 or S4 sounds.     Comments: No carotid bruits.  Carotid, radial, femoral, DP and PT pulses normal and equal.  Varicosities of LE Pulmonary:     Effort: Pulmonary effort is normal.     Breath sounds: Normal breath sounds and air entry.  Chest:  Breasts:    Right: No inverted nipple, mass or nipple discharge.     Left: No inverted nipple, mass or nipple discharge.  Abdominal:     General: Bowel sounds are normal.     Palpations: Abdomen is soft. There is no hepatomegaly, splenomegaly or mass.     Tenderness: There is no abdominal tenderness.     Hernia: No hernia is present.  Genitourinary:    Comments: Normal external female genitalia No uterine or adnexal mass or tenderness. Musculoskeletal:        General: Normal range of motion.     Cervical back: Normal range of motion and neck supple.     Right lower leg: No edema.     Left lower leg: No edema.  Lymphadenopathy:     Head:     Right side of head: No submental or submandibular adenopathy.     Left side of head: No submental or submandibular adenopathy.     Cervical: No cervical adenopathy.     Upper Body:     Right upper body: No supraclavicular or axillary adenopathy.     Left upper body: No supraclavicular or axillary adenopathy.     Lower Body: No right inguinal adenopathy. No left inguinal adenopathy.  Skin:    General: Skin is warm.     Capillary Refill: Capillary refill takes less than 2 seconds.      Findings: No rash.  Neurological:     General: No focal deficit present.     Mental Status: She is alert and oriented to person, place, and time.     Cranial Nerves: Cranial nerves 2-12 are intact.     Sensory: Sensation is intact.     Motor: Motor function is intact.     Coordination: Coordination is intact.     Gait: Gait is intact.     Deep Tendon Reflexes: Reflexes are normal and symmetric.  Psychiatric:        Behavior: Behavior normal. Behavior is cooperative.      Assessment & Plan   CPE without pap Mammogram ordered--BCCCP CBC  2.  PVCs:  need to look into cardiology charges/[facility] charges and see if anything can be done.  Referral to [facility], Amparo Bristol.    3.  Hyperlipidemia:  FLP  4.  Muscle cramps:  CMP, TSH, Vitamin D level.  5.  Obesity:  encouraged goals for increasing physical activity and improving diet.

## 2022-11-27 LAB — LIPID PANEL W/O CHOL/HDL RATIO
Cholesterol, Total: 266 mg/dL — ABNORMAL HIGH (ref 100–199)
HDL: 32 mg/dL — ABNORMAL LOW (ref >39)
LDL Chol Calc (NIH): 117 mg/dL — ABNORMAL HIGH (ref 0–99)
Triglycerides: 652 mg/dL (ref 0–149)
VLDL Cholesterol Cal: 117 mg/dL — ABNORMAL HIGH (ref 5–40)

## 2022-11-27 LAB — COMPREHENSIVE METABOLIC PANEL
ALT: 16 IU/L (ref 0–32)
AST: 18 IU/L (ref 0–40)
Albumin/Globulin Ratio: 1.8
Albumin: 4.6 g/dL (ref 3.8–4.9)
Alkaline Phosphatase: 94 IU/L (ref 44–121)
BUN/Creatinine Ratio: 16 (ref 9–23)
BUN: 10 mg/dL (ref 6–24)
Bilirubin Total: 0.3 mg/dL (ref 0.0–1.2)
CO2: 24 mmol/L (ref 20–29)
Calcium: 9.3 mg/dL (ref 8.7–10.2)
Chloride: 101 mmol/L (ref 96–106)
Creatinine, Ser: 0.64 mg/dL (ref 0.57–1.00)
Globulin, Total: 2.6 g/dL (ref 1.5–4.5)
Glucose: 80 mg/dL (ref 70–99)
Potassium: 3.8 mmol/L (ref 3.5–5.2)
Sodium: 139 mmol/L (ref 134–144)
Total Protein: 7.2 g/dL (ref 6.0–8.5)
eGFR: 104 mL/min/{1.73_m2} (ref >59)

## 2022-11-27 LAB — CBC WITH DIFFERENTIAL/PLATELET
Basophils Absolute: 0 10*3/uL (ref 0.0–0.2)
Basos: 0 %
EOS (ABSOLUTE): 0.2 10*3/uL (ref 0.0–0.4)
Eos: 3 %
Hematocrit: 44.9 % (ref 34.0–46.6)
Hemoglobin: 14.5 g/dL (ref 11.1–15.9)
Immature Grans (Abs): 0 10*3/uL (ref 0.0–0.1)
Immature Granulocytes: 0 %
Lymphocytes Absolute: 1.3 10*3/uL (ref 0.7–3.1)
Lymphs: 26 %
MCH: 29.2 pg (ref 26.6–33.0)
MCHC: 32.3 g/dL (ref 31.5–35.7)
MCV: 90 fL (ref 79–97)
Monocytes Absolute: 0.4 10*3/uL (ref 0.1–0.9)
Monocytes: 7 %
Neutrophils Absolute: 3.3 10*3/uL (ref 1.4–7.0)
Neutrophils: 64 %
Platelets: 225 10*3/uL (ref 150–450)
RBC: 4.97 x10E6/uL (ref 3.77–5.28)
RDW: 14.1 % (ref 11.7–15.4)
WBC: 5.2 10*3/uL (ref 3.4–10.8)

## 2022-11-27 LAB — TSH: TSH: 1.48 u[IU]/mL (ref 0.450–4.500)

## 2022-11-27 LAB — VITAMIN D 25 HYDROXY (VIT D DEFICIENCY, FRACTURES): Vit D, 25-Hydroxy: 14.9 ng/mL — ABNORMAL LOW (ref 30.0–100.0)

## 2022-12-02 ENCOUNTER — Other Ambulatory Visit: Payer: Self-pay | Admitting: Internal Medicine

## 2022-12-02 MED ORDER — ATORVASTATIN CALCIUM 40 MG PO TABS: ORAL_TABLET | ORAL | 11 refills | Status: DC

## 2023-02-25 ENCOUNTER — Telehealth: Payer: Self-pay

## 2023-02-25 NOTE — Telephone Encounter (Signed)
Patient would like an appointment for  Patient noticed one month ago that one of her moles on her back is growing and it itches a lot and it changes color to white or red.   If scratches mole a lot it hurts.  Patient is available Mondays and Thursdays any time other days she has availability after 2pm.  Will call patient if there is a cancellation.

## 2023-03-01 NOTE — Telephone Encounter (Signed)
Patient has been scheduled

## 2023-03-02 ENCOUNTER — Ambulatory Visit: Payer: Self-pay | Admitting: Internal Medicine

## 2023-03-02 ENCOUNTER — Telehealth: Payer: Self-pay

## 2023-03-02 NOTE — Telephone Encounter (Signed)
Patient needs appointment for mole concerns. Mole changes colors and is getting bigger and it itches.  Patient is available any day in the afternoon after 12pm And Mondays and Thursdays all day.   Will call patient if there is a cancellation.

## 2023-03-04 ENCOUNTER — Ambulatory Visit: Payer: Self-pay | Admitting: Internal Medicine

## 2023-03-04 VITALS — BP 124/80 | HR 76 | Resp 18 | Ht 59.5 in | Wt 175.0 lb

## 2023-03-04 DIAGNOSIS — Z23 Encounter for immunization: Secondary | ICD-10-CM

## 2023-03-04 DIAGNOSIS — L821 Other seborrheic keratosis: Secondary | ICD-10-CM

## 2023-03-04 NOTE — Progress Notes (Signed)
Acute Office Visit  Subjective:      Patient ID: Felicia Ray, female    DOB: 1966-04-21, 57 y.o.   MRN: [id number]  Chief Complaint  Patient presents with   skin concern    Mole on her back is causing discomfort.     HPI  Skin lesion:   Has had a skin lesion on her left lateral upper back for several years. Noticed 1.5-2 months ago that it has increased in size and is now itchy. Single episode of bleeding after scratching at it. Had a neighbor look at her back because she thought she had a tick on her back. Was told that she had a large white mole with reddened skin around it from scratching.  Immunizations: Would like her Influenza vaccine today.   Immunization History  Administered Date(s) Administered   Influenza, Mdck, Trivalent,PF 6+ MOS(egg free) 03/04/2023   Influenza,inj,Quad PF,6+ Mos 03/26/2016, 04/04/2018   Influenza-Unspecified 04/14/2016, 03/30/2019, 03/17/2021, 03/17/2022   PFIZER(Purple Top)SARS-COV-2 Vaccination 08/25/2019, 09/15/2019, 04/13/2020   Pneumococcal Polysaccharide-23 01/13/2019   Tdap 02/20/2016    Current Meds  Medication Sig   albuterol (VENTOLIN HFA) 108 (90 Base) MCG/ACT inhaler Inhale 1-2 puffs into the lungs every 6 (six) hours as needed for wheezing or shortness of breath.    Allergies  Allergen Reactions   Aspirin Anaphylaxis    "Almost died after she was given this- felt like a heart attack"   Dipyrone Anaphylaxis and Hives    "Almost died after she was given this- felt like a heart attack" (also)   Dipyrone Hives   Other     metamizole - blindness, n/v (an international drug)     Review of Systems  Constitutional:  Negative for chills and fever.  Skin:  Positive for itching. Negative for rash.       No bleeding. Mole that has increased in size on left upper back, itching.        Objective:    BP 124/80 (BP Location: Right Arm, Patient Position: Sitting, Cuff Size: Normal)   Pulse 76   Resp 18   Ht 4' 11.5"  (1.511 m)   Wt 175 lb (79.4 kg)   LMP  (LMP Unknown)   BMI 34.75 kg/m    Physical Exam Constitutional:      General: She is not in acute distress.    Appearance: Normal appearance.  Skin:    General: Skin is warm and dry.     Comments: Multiple (4) lesions on upper back. Lesion of concern is on lateral left upper back, raised with dry, rough, and cracked dark brown/gray appearance. Well circumscribed with no bleeding. 1-59mm reddened ring around the base. Reddened skin surrounding lesion appears to be from scratching. 3 other lesions with similar less raised appearance scattered on upper back.    Liquid nitrogen was applied for 10-12 seconds to the skin lesions and the expected blistering or scabbing reaction explained.         Assessment & Plan:    Seborrheic keratoses:  frozen with liquid nitrogen to return in 2 weeks to see if reapplication required. Keep lesions covered. Patient reminded not to pick at the areas. Patient reminded to expect hypopigmented scars from the procedure. Return if lesions fail to fully resolve. Call for redness, discharge, increasing pain.        Deland Pretty Student FNP

## 2023-03-05 ENCOUNTER — Encounter: Payer: Self-pay | Admitting: Internal Medicine

## 2023-03-05 NOTE — Progress Notes (Signed)
Agree with note and assessment and plan

## 2023-03-08 ENCOUNTER — Other Ambulatory Visit: Payer: Self-pay

## 2023-03-08 NOTE — Telephone Encounter (Signed)
Patient has been seen.

## 2023-03-23 ENCOUNTER — Ambulatory Visit: Payer: Self-pay | Admitting: Internal Medicine

## 2023-03-23 ENCOUNTER — Encounter: Payer: Self-pay | Admitting: Internal Medicine

## 2023-03-23 VITALS — BP 110/64 | HR 66 | Resp 16 | Ht 59.5 in | Wt 177.0 lb

## 2023-03-23 DIAGNOSIS — L821 Other seborrheic keratosis: Secondary | ICD-10-CM

## 2023-03-23 DIAGNOSIS — L918 Other hypertrophic disorders of the skin: Secondary | ICD-10-CM

## 2023-03-23 NOTE — Progress Notes (Signed)
Liquid nitrogen was applied for 10-12 seconds to the skin lesions and the expected blistering or scabbing reaction explained. Do not pick at the areas. Patient reminded to expect hypopigmented scars from the procedure. Return if lesions fail to fully resolve.  4 lesions on back:  skin tag at left posterior lateral mid thoracic area. Seborrheic keratosis with dried top removed and remaining lesion underneath frozen 2 other seborrheic keratoses on right upper back.   Bandaids applied to prevent rubbing against clothing.  To call for redness, swelling, discharge or fever.

## 2023-05-17 IMAGING — DX DG CHEST 2V
2 series · 2 of 2 positions shown · non-contrast
Comparison: Chest radiograph dated 02/18/2021.

CLINICAL DATA: Shortness of breath.

EXAM:
CHEST - 2 VIEW

[chest pa]
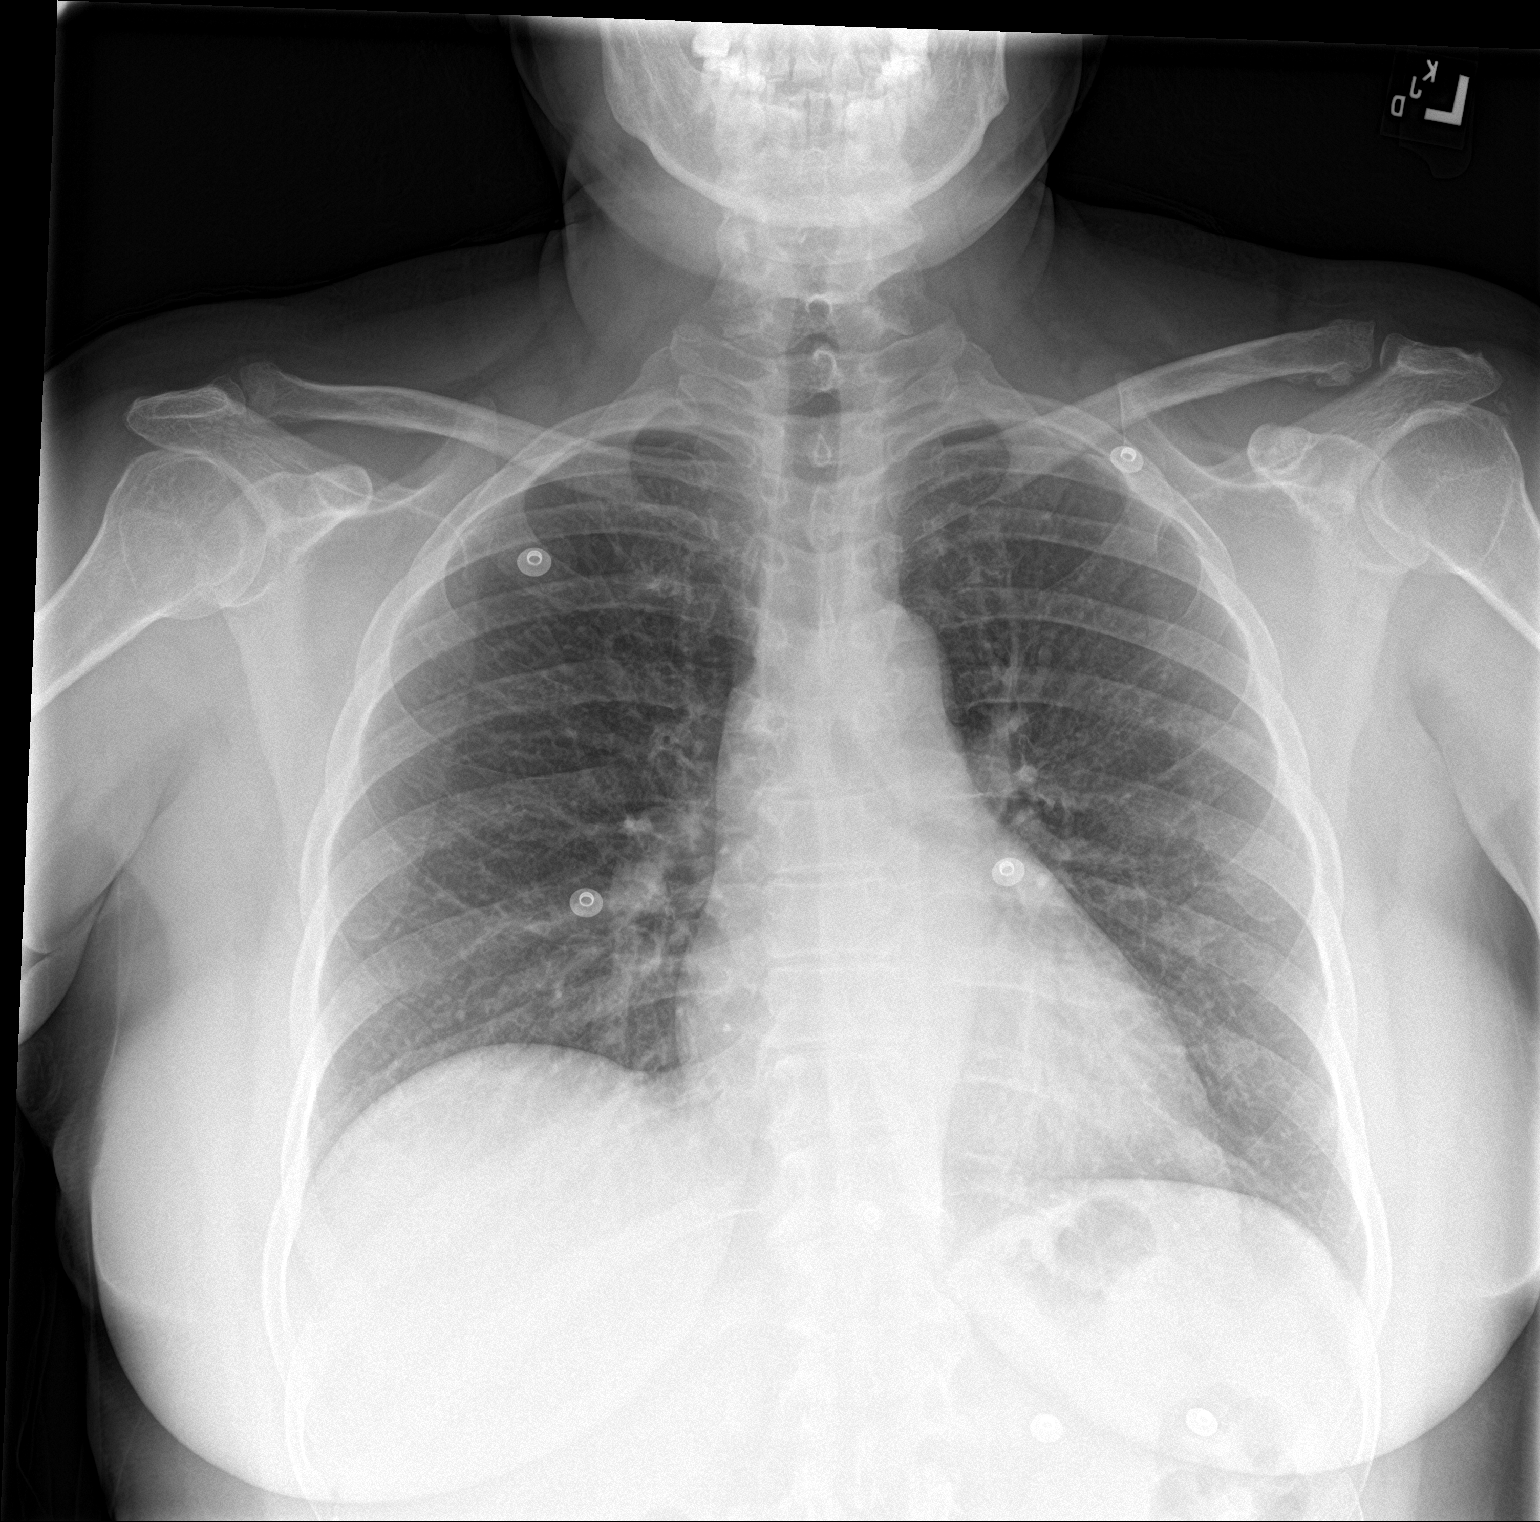

[chest lat]
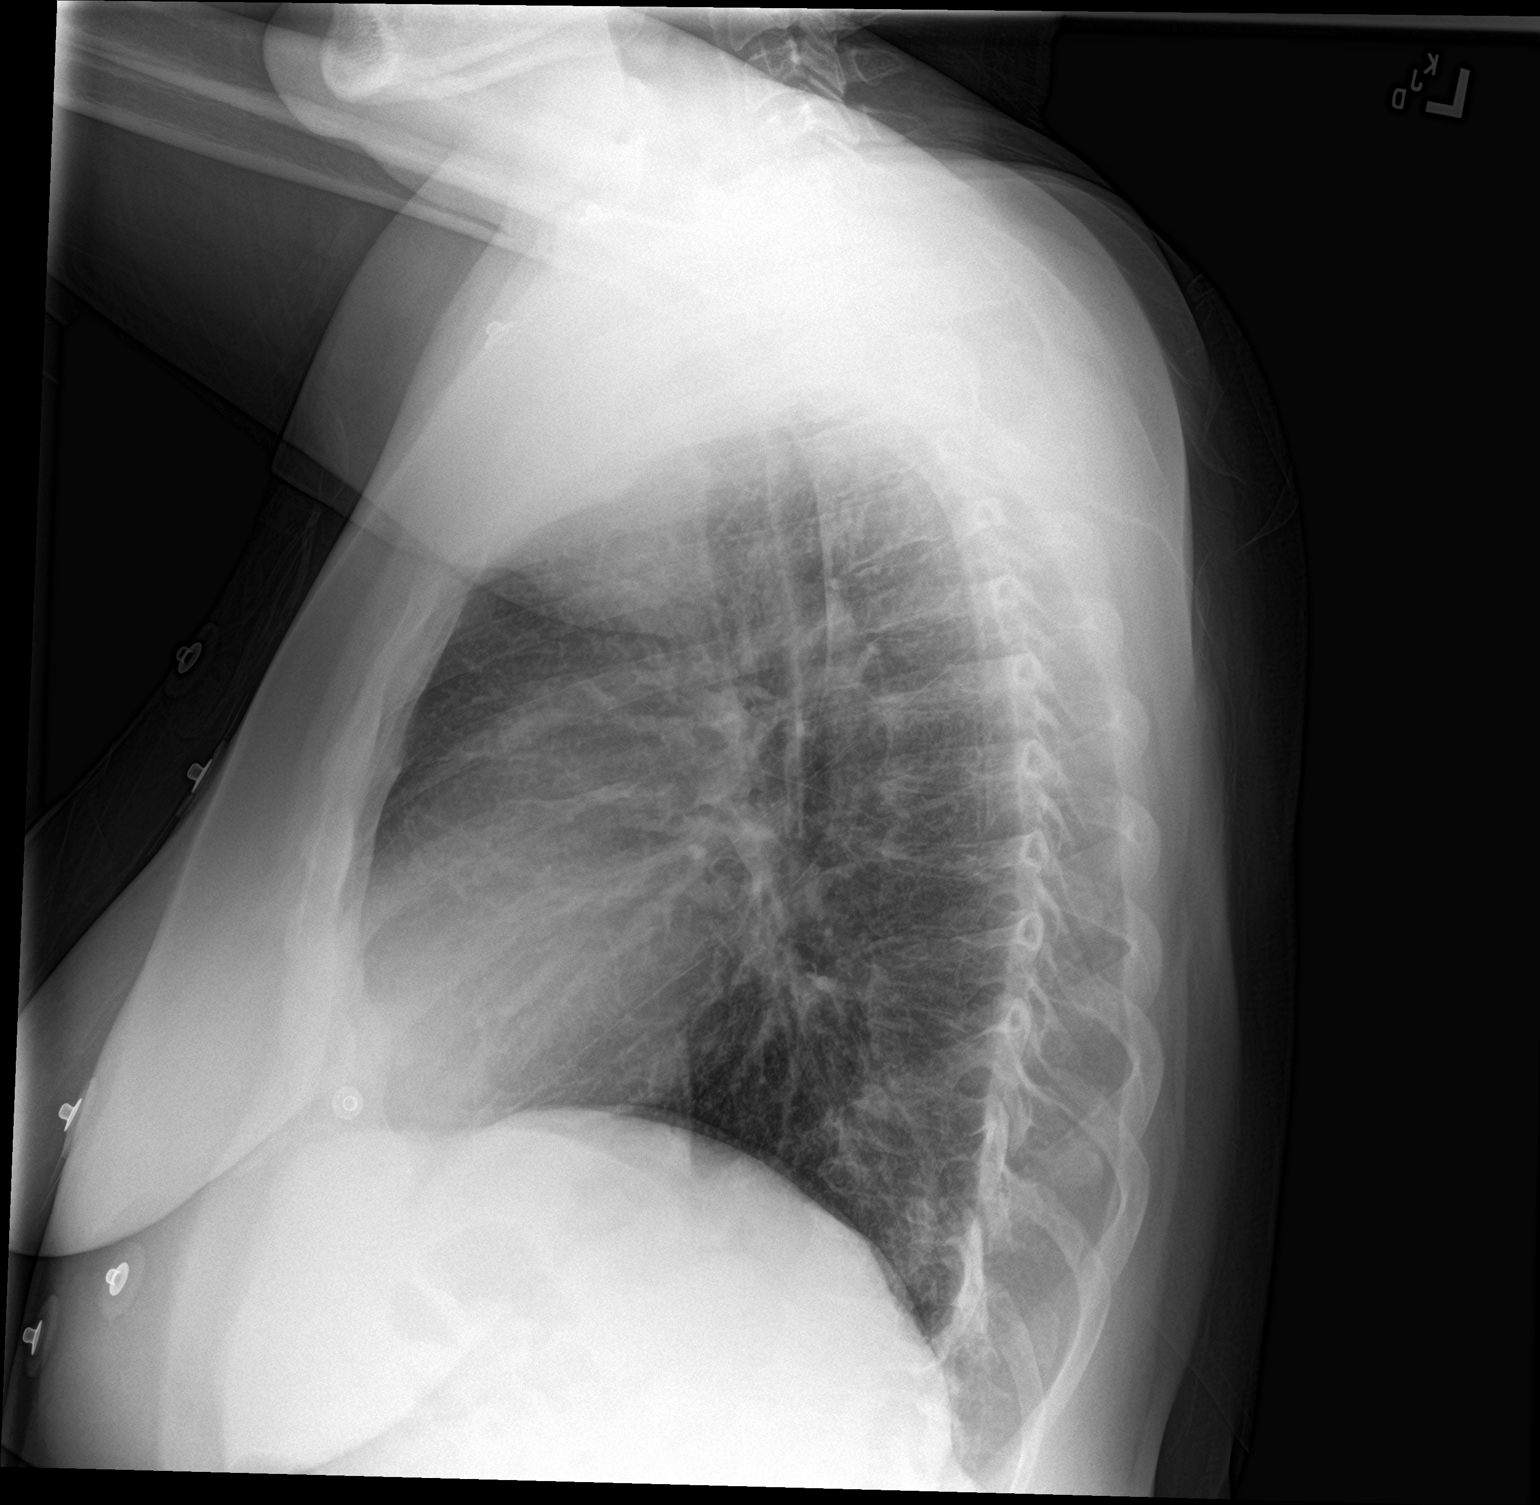

[2 of 2 positions shown; findings below may reference images not displayed]

FINDINGS: The heart size and mediastinal contours are within normal limits.
Both lungs are clear. The visualized skeletal structures are
unremarkable.
IMPRESSION: No active cardiopulmonary disease.

## 2023-06-01 ENCOUNTER — Ambulatory Visit: Payer: Self-pay | Admitting: Internal Medicine

## 2023-06-06 ENCOUNTER — Emergency Department (HOSPITAL_COMMUNITY)
Admission: EM | Admit: 2023-06-06 | Discharge: 2023-06-07 | Disposition: A | Discharge status: Home / Self Care | Payer: Self-pay | Attending: Emergency Medicine | Admitting: Emergency Medicine

## 2023-06-06 ENCOUNTER — Emergency Department (HOSPITAL_COMMUNITY): Payer: Self-pay

## 2023-06-06 ENCOUNTER — Other Ambulatory Visit: Payer: Self-pay

## 2023-06-06 ENCOUNTER — Encounter (HOSPITAL_COMMUNITY): Payer: Self-pay

## 2023-06-06 DIAGNOSIS — R1010 Upper abdominal pain, unspecified: Secondary | ICD-10-CM

## 2023-06-06 DIAGNOSIS — R1011 Right upper quadrant pain: Secondary | ICD-10-CM | POA: Insufficient documentation

## 2023-06-06 DIAGNOSIS — R1013 Epigastric pain: Secondary | ICD-10-CM | POA: Insufficient documentation

## 2023-06-06 LAB — COMPREHENSIVE METABOLIC PANEL
ALT: 19 U/L (ref 0–44)
AST: 23 U/L (ref 15–41)
Albumin: 4 g/dL (ref 3.5–5.0)
Alkaline Phosphatase: 77 U/L (ref 38–126)
Anion gap: 14 (ref 5–15)
BUN: 14 mg/dL (ref 6–20)
CO2: 21 mmol/L — ABNORMAL LOW (ref 22–32)
Calcium: 8.8 mg/dL — ABNORMAL LOW (ref 8.9–10.3)
Chloride: 103 mmol/L (ref 98–111)
Creatinine, Ser: 0.73 mg/dL (ref 0.44–1.00)
GFR, Estimated: >60 mL/min (ref >60)
Glucose, Bld: 110 mg/dL — ABNORMAL HIGH (ref 70–99)
Potassium: 3.8 mmol/L (ref 3.5–5.1)
Sodium: 138 mmol/L (ref 135–145)
Total Bilirubin: 1.2 mg/dL — ABNORMAL HIGH (ref <1.2)
Total Protein: 7.2 g/dL (ref 6.5–8.1)

## 2023-06-06 LAB — URINALYSIS, ROUTINE W REFLEX MICROSCOPIC
Bilirubin Urine: NEGATIVE
Glucose, UA: NEGATIVE mg/dL
Hgb urine dipstick: NEGATIVE
Ketones, ur: NEGATIVE mg/dL
Leukocytes,Ua: NEGATIVE
Nitrite: NEGATIVE
Protein, ur: NEGATIVE mg/dL
Specific Gravity, Urine: 1.014 (ref 1.005–1.030)
pH: 8 (ref 5.0–8.0)

## 2023-06-06 LAB — CBC
HCT: 43.4 % (ref 36.0–46.0)
Hemoglobin: 14.9 g/dL (ref 12.0–15.0)
MCH: 30.1 pg (ref 26.0–34.0)
MCHC: 34.3 g/dL (ref 30.0–36.0)
MCV: 87.7 fL (ref 80.0–100.0)
Platelets: 230 10*3/uL (ref 150–400)
RBC: 4.95 MIL/uL (ref 3.87–5.11)
RDW: 13.3 % (ref 11.5–15.5)
WBC: 10.6 10*3/uL — ABNORMAL HIGH (ref 4.0–10.5)
nRBC: 0 % (ref 0.0–0.2)

## 2023-06-06 LAB — LIPASE, BLOOD: Lipase: 25 U/L (ref 11–51)

## 2023-06-06 MED ORDER — ONDANSETRON 4 MG PO TBDP
4.0000 mg | ORAL_TABLET | Freq: Once | ORAL | Status: AC | PRN
  Administered 2023-06-06: 4 mg via ORAL
  Filled 2023-06-06: qty 1

## 2023-06-06 MED ORDER — MORPHINE SULFATE (PF) 4 MG/ML IV SOLN
4.0000 mg | Freq: Once | INTRAVENOUS | Status: AC
  Administered 2023-06-06: 4 mg via INTRAVENOUS
  Filled 2023-06-06: qty 1

## 2023-06-06 MED ORDER — ONDANSETRON HCL 4 MG/2ML IJ SOLN
4.0000 mg | Freq: Once | INTRAMUSCULAR | Status: AC
  Administered 2023-06-06: 4 mg via INTRAVENOUS
  Filled 2023-06-06: qty 2

## 2023-06-06 NOTE — ED Triage Notes (Signed)
Complaining of abdominal pain in the right upper abdomen since she ate supper at 5 pm. Has been vomiting but no diarrhea.

## 2023-06-06 NOTE — ED Provider Notes (Signed)
[place] EMERGENCY DEPARTMENT AT [facility] Provider Note   CSN: [id number] Arrival date & time: 06/06/23  2055     History {Add pertinent medical, surgical, social history, OB history to HPI:1} Chief Complaint  Patient presents with   Abdominal Pain   Weakness    Hart Robinsons Jesus Zanyla Jorde is a 57 y.o. female.  The history is provided by the patient and medical records.  Abdominal Pain Weakness Associated symptoms: abdominal pain    57 year old female with history of obesity, depression, hyperlipidemia, presenting to the ED for abdominal pain.  Daughter reports they attended a church service this morning and ate lunch they are around 2:30 PM.  States she ate some kind of "red chicken" with lots of condiments on it and afterwards she began having a lot of vomiting.  She has had multiple episodes, mostly "yellow" in color.  She has not had any hematemesis.  No diarrhea.  Now cannot even hold down water.  She has not had any fever.  Has had prior tubal ligation.  No medication taken prior to arrival.  Home Medications Prior to Admission medications   Medication Sig Start Date End Date Taking? Authorizing Provider  albuterol (VENTOLIN HFA) 108 (90 Base) MCG/ACT inhaler Inhale 1-2 puffs into the lungs every 6 (six) hours as needed for wheezing or shortness of breath. 07/06/21   Prosperi, Christian H, PA-C  atorvastatin (LIPITOR) 40 MG tablet 1 tab by mouth with evening meal daily. Patient not taking: Reported on 03/04/2023 12/02/22   Julieanne Manson, MD  cyclobenzaprine (FLEXERIL) 10 MG tablet Take 1 tablet (10 mg total) by mouth 2 (two) times daily as needed for muscle spasms. Patient not taking: Reported on 11/26/2022 08/08/22   Jeannie Fend, PA-C  flecainide (TAMBOCOR) 50 MG tablet Take 1 tablet (50 mg total) by mouth 2 (two) times daily. Patient not taking: Reported on 11/26/2022 04/27/22   Regan Lemming, MD  methylPREDNISolone (MEDROL DOSEPAK) 4 MG TBPK tablet  Take as directed on package Patient not taking: Reported on 11/26/2022 05/05/22   Smoot, Shawn Route, PA-C  metoprolol tartrate (LOPRESSOR) 25 MG tablet Take 0.5 tablets (12.5 mg total) by mouth 2 (two) times daily. Patient not taking: Reported on 11/26/2022 05/05/21   Nahser, Deloris Ping, MD  Omega-3 Fatty Acids (FISH OIL PO) Take 1 capsule by mouth daily. Patient not taking: Reported on 11/26/2022    [provider]  cetirizine (ZYRTEC) 10 MG tablet Take 1 tablet (10 mg total) by mouth daily. Patient not taking: Reported on 02/09/2019 08/04/18 07/26/19  Julieanne Manson, MD      Allergies    Aspirin, Dipyrone, Dipyrone, and Other    Review of Systems   Review of Systems  Gastrointestinal:  Positive for abdominal pain.  All other systems reviewed and are negative.   Physical Exam Updated Vital Signs BP 114/76 (BP Location: Right Arm)   Pulse 96   Temp 98.9 F (37.2 C) (Oral)   Resp 19   Ht 4\' 11"  (1.499 m)   Wt 79.8 kg   LMP  (LMP Unknown)   SpO2 98%   BMI 35.55 kg/m   Physical Exam Vitals and nursing note reviewed.  Constitutional:      Appearance: She is well-developed.     Comments: Appears uncomfortable, emesis on shirt  HENT:     Head: Normocephalic and atraumatic.  Eyes:     Conjunctiva/sclera: Conjunctivae normal.     Pupils: Pupils are equal, round, and reactive  to light.  Cardiovascular:     Rate and Rhythm: Normal rate and regular rhythm.     Heart sounds: Normal heart sounds.  Pulmonary:     Effort: Pulmonary effort is normal.     Breath sounds: Normal breath sounds.  Abdominal:     General: Bowel sounds are normal.     Palpations: Abdomen is soft.     Tenderness: There is abdominal tenderness in the right upper quadrant. There is no rebound. Negative signs include Murphy's sign.  Musculoskeletal:        General: Normal range of motion.     Cervical back: Normal range of motion.  Skin:    General: Skin is warm and dry.  Neurological:     Mental  Status: She is alert and oriented to person, place, and time.     ED Results / Procedures / Treatments   Labs (all labs ordered are listed, but only abnormal results are displayed) Labs Reviewed  COMPREHENSIVE METABOLIC PANEL - Abnormal; Notable for the following components:      Result Value   CO2 21 (*)    Glucose, Bld 110 (*)    Calcium 8.8 (*)    Total Bilirubin 1.2 (*)    All other components within normal limits  CBC - Abnormal; Notable for the following components:   WBC 10.6 (*)    All other components within normal limits  LIPASE, BLOOD  URINALYSIS, ROUTINE W REFLEX MICROSCOPIC    EKG None  Radiology No results found.  Procedures Procedures  {Document cardiac monitor, telemetry assessment procedure when appropriate:1}  Medications Ordered in ED Medications  morphine (PF) 4 MG/ML injection 4 mg (has no administration in time range)  ondansetron (ZOFRAN) injection 4 mg (has no administration in time range)  ondansetron (ZOFRAN-ODT) disintegrating tablet 4 mg (4 mg Oral Given 06/06/23 2114)    ED Course/ Medical Decision Making/ A&P   {   Click here for ABCD2, HEART and other calculatorsREFRESH Note before signing :1}                              Medical Decision Making Amount and/or Complexity of Data Reviewed Labs: ordered. Radiology: ordered.  Risk Prescription drug management.   ***  {Document critical care time when appropriate:1} {Document review of labs and clinical decision tools ie heart score, Chads2Vasc2 etc:1}  {Document your independent review of radiology images, and any outside records:1} {Document your discussion with family members, caretakers, and with consultants:1} {Document social determinants of health affecting pt's care:1} {Document your decision making why or why not admission, treatments were needed:1} Final Clinical Impression(s) / ED Diagnoses Final diagnoses:  None    Rx / DC Orders ED Discharge Orders     None

## 2023-06-06 NOTE — ED Triage Notes (Deleted)
Complaining of feeling weak. Went to donate plasma today and they told him his iron was low. Told him to come to the hospital.

## 2023-06-07 MED ORDER — ONDANSETRON 4 MG PO TBDP: 4.0000 mg | ORAL_TABLET | Freq: Three times a day (TID) | ORAL | 0 refills | Status: AC | PRN

## 2023-06-07 MED ORDER — DICYCLOMINE HCL 20 MG PO TABS: 20.0000 mg | ORAL_TABLET | Freq: Two times a day (BID) | ORAL | 0 refills | Status: DC

## 2023-06-07 NOTE — Discharge Instructions (Signed)
Take the prescribed medication as directed.   Gentle diet for now and advance as tolerated.  Stay hydrated. Follow-up with your primary care doctor. Return to the ED for new or worsening symptoms.

## 2023-09-16 ENCOUNTER — Ambulatory Visit: Payer: Self-pay | Admitting: Internal Medicine

## 2023-09-16 VITALS — BP 112/70 | HR 69 | Resp 18 | Ht 59.5 in | Wt 178.0 lb

## 2023-09-16 DIAGNOSIS — R1012 Left upper quadrant pain: Secondary | ICD-10-CM

## 2023-09-16 DIAGNOSIS — E782 Mixed hyperlipidemia: Secondary | ICD-10-CM

## 2023-09-16 DIAGNOSIS — R140 Abdominal distension (gaseous): Secondary | ICD-10-CM

## 2023-09-16 MED ORDER — ATORVASTATIN CALCIUM 40 MG PO TABS: ORAL_TABLET | ORAL | 11 refills | Status: DC

## 2023-09-16 MED ORDER — DICYCLOMINE HCL 20 MG PO TABS: ORAL_TABLET | ORAL | 6 refills | Status: DC

## 2023-09-16 NOTE — Progress Notes (Signed)
 Subjective:    Patient ID: Felicia Ray, female   DOB: 1965-08-23, 58 y.o.   MRN: [id number]   HPI  Felicia Ray interprets   Lambert Mody left upper quadrant discomfort for about 3 months:  states this is different than the discomfort she went to ED for on 06/06/2023 when complained of epigastric/RUQ pain and vomiting after eating "red chicken"  Started about 1 week later. Pain occurs perhaps 3 times weekly.  Can last half to a whole day and when she has the pain, it is constant.  She has noticed she gets the pain after eating.  Generally, the pain occurs during the meal and then feels bloated and will then have a watery stool.  She denies hematochezia, but has had black watery stools.  She has not used Pepto bismal.  Has noted the black stools 6-7 times.  Last episode was 3 weeks ago.   She does not have pain relief or refief of bloating after having a BM.   Chamomile tea seems to help with the pain.  She also did get a prescription in Urgent care for dicyclomine and it did help with this pain as well.    Eating beef or fried food seem to be more significant triggers than other foods.   No LUQ pain with bending over No associated vomiting.   No associated weight loss.  [place] of RUQ with December Urgent Care visit was normal save for early fatty liver.     2.  Seborrheic keratoses:  may still have a small residual from one of the 3 lesions.  Does not itch.     3.  Hyperlipidemia:  Has stopped all meds, including statin.  Stopped them all last year as she felt they were causing her to gain weight.    4.  PVCs:  not having palpitations off Fecainide or Metoprolol.  Never returned to Cardiology.  State  No outpatient medications have been marked as taking for the 09/16/23 encounter (Office Visit) with Julieanne Manson, MD.   Allergies  Allergen Reactions   Aspirin Anaphylaxis    "Almost died after she was given this- felt like a heart attack"   Dipyrone Anaphylaxis and  Hives    "Almost died after she was given this- felt like a heart attack" (also)   Dipyrone Hives   Other     metamizole - blindness, n/v (an international drug)     Review of Systems    Objective:   BP 112/70 (BP Location: Left Arm, Patient Position: Sitting, Cuff Size: Normal)   Pulse 69   Resp 18   Ht 4' 11.5" (1.511 m)   Wt 178 lb (80.7 kg)   LMP  (LMP Unknown)   BMI 35.35 kg/m   Physical Exam NAD, obese Lungs:  CTA CV:  RRR without murmur or rub.  Radial and DP pulses normal and equal Abd: S, NT over abdomen, including LUQ, but very tender over left lower anterior rib cage.  No HSM or mass, + BS  Assessment & Plan   LUQ pain and bloating with possible melena:  FIT, CBC, CmP, Lipase.  Not clear what is exactly etiology of her discomfort as she is not tender over abdomen, but complains of worsening with eating and also bloating.  Refill dicyclomine to take before meals as needed or if pain already apparent    2.  Hyperlipidemia:  Restart Atorvastatin.  FLP and CMP in 2 months.    3.  PVCs and history of presyncope:  off metoprolol and Flecainide.  No symptoms for some time.  She has outstanding bills with cardiology and does not want to return for follow up.

## 2023-09-17 LAB — COMPREHENSIVE METABOLIC PANEL WITH GFR
ALT: 17 IU/L (ref 0–32)
AST: 21 IU/L (ref 0–40)
Albumin: 4.2 g/dL (ref 3.8–4.9)
Alkaline Phosphatase: 95 IU/L (ref 44–121)
BUN/Creatinine Ratio: 13 (ref 9–23)
BUN: 11 mg/dL (ref 6–24)
Bilirubin Total: 0.3 mg/dL (ref 0.0–1.2)
CO2: 21 mmol/L (ref 20–29)
Calcium: 9.2 mg/dL (ref 8.7–10.2)
Chloride: 100 mmol/L (ref 96–106)
Creatinine, Ser: 0.86 mg/dL (ref 0.57–1.00)
Globulin, Total: 2.6 g/dL (ref 1.5–4.5)
Glucose: 84 mg/dL (ref 70–99)
Potassium: 4.4 mmol/L (ref 3.5–5.2)
Sodium: 138 mmol/L (ref 134–144)
Total Protein: 6.8 g/dL (ref 6.0–8.5)
eGFR: 79 mL/min/{1.73_m2} (ref >59)

## 2023-09-17 LAB — CBC WITH DIFFERENTIAL/PLATELET
Basophils Absolute: 0 10*3/uL (ref 0.0–0.2)
Basos: 1 %
EOS (ABSOLUTE): 0.2 10*3/uL (ref 0.0–0.4)
Eos: 3 %
Hematocrit: 41.6 % (ref 34.0–46.6)
Hemoglobin: 14 g/dL (ref 11.1–15.9)
Immature Grans (Abs): 0 10*3/uL (ref 0.0–0.1)
Immature Granulocytes: 0 %
Lymphocytes Absolute: 1.6 10*3/uL (ref 0.7–3.1)
Lymphs: 29 %
MCH: 30.6 pg (ref 26.6–33.0)
MCHC: 33.7 g/dL (ref 31.5–35.7)
MCV: 91 fL (ref 79–97)
Monocytes Absolute: 0.5 10*3/uL (ref 0.1–0.9)
Monocytes: 9 %
Neutrophils Absolute: 3.3 10*3/uL (ref 1.4–7.0)
Neutrophils: 58 %
Platelets: 226 10*3/uL (ref 150–450)
RBC: 4.58 x10E6/uL (ref 3.77–5.28)
RDW: 14 % (ref 11.7–15.4)
WBC: 5.7 10*3/uL (ref 3.4–10.8)

## 2023-09-17 LAB — LIPASE: Lipase: 25 U/L (ref 14–72)

## 2023-10-04 ENCOUNTER — Encounter: Payer: Self-pay | Admitting: Internal Medicine

## 2023-10-05 ENCOUNTER — Other Ambulatory Visit: Payer: Self-pay

## 2023-10-05 MED ORDER — DICYCLOMINE HCL 20 MG PO TABS: ORAL_TABLET | ORAL | 6 refills | Status: AC

## 2023-10-06 ENCOUNTER — Other Ambulatory Visit: Payer: Self-pay

## 2023-10-06 DIAGNOSIS — R195 Other fecal abnormalities: Secondary | ICD-10-CM

## 2023-10-06 DIAGNOSIS — Z1211 Encounter for screening for malignant neoplasm of colon: Secondary | ICD-10-CM

## 2023-10-06 LAB — POC FIT TEST STOOL: Fecal Occult Blood: POSITIVE

## 2023-10-06 NOTE — Addendum Note (Signed)
 Addended by: Trudi Fus on: 10/06/2023 06:37 PM   Modules accepted: Orders

## 2023-10-07 ENCOUNTER — Other Ambulatory Visit: Payer: Self-pay

## 2023-10-07 VITALS — Temp 98.3°F

## 2023-10-07 DIAGNOSIS — R059 Cough, unspecified: Secondary | ICD-10-CM

## 2023-10-07 LAB — POCT INFLUENZA A/B
Influenza A, POC: NEGATIVE
Influenza B, POC: NEGATIVE

## 2023-10-07 LAB — POC COVID19 BINAXNOW: SARS Coronavirus 2 Ag: NEGATIVE

## 2023-10-07 NOTE — Progress Notes (Signed)
 Per Dr. Jayne Mews patient can take tylenol  and robitussin for the cough   Patient is aware and understands

## 2023-10-07 NOTE — Progress Notes (Signed)
 Patient reports body ache, cough and having a lot of mucous which started yesterday

## 2023-10-14 ENCOUNTER — Ambulatory Visit: Payer: Self-pay | Admitting: Internal Medicine

## 2023-10-14 ENCOUNTER — Other Ambulatory Visit: Payer: Self-pay

## 2023-10-14 ENCOUNTER — Encounter: Payer: Self-pay | Admitting: Internal Medicine

## 2023-10-14 VITALS — BP 112/82 | HR 74 | Temp 98.3°F | Resp 16 | Ht 59.5 in | Wt 179.5 lb

## 2023-10-14 DIAGNOSIS — B9689 Other specified bacterial agents as the cause of diseases classified elsewhere: Secondary | ICD-10-CM

## 2023-10-14 DIAGNOSIS — J069 Acute upper respiratory infection, unspecified: Secondary | ICD-10-CM

## 2023-10-14 MED ORDER — BENZONATATE 100 MG PO CAPS: ORAL_CAPSULE | ORAL | 0 refills | Status: DC

## 2023-10-14 MED ORDER — ALBUTEROL SULFATE HFA 108 (90 BASE) MCG/ACT IN AERS: 1.0000 | INHALATION_SPRAY | Freq: Four times a day (QID) | RESPIRATORY_TRACT | 1 refills | Status: AC | PRN

## 2023-10-14 MED ORDER — BENZONATATE 200 MG PO CAPS: 200.0000 mg | ORAL_CAPSULE | Freq: Two times a day (BID) | ORAL | 0 refills | Status: DC | PRN

## 2023-10-14 MED ORDER — PREDNISONE 10 MG PO TABS: ORAL_TABLET | ORAL | 0 refills | Status: DC

## 2023-10-14 MED ORDER — AMOXICILLIN-POT CLAVULANATE 875-125 MG PO TABS: ORAL_TABLET | ORAL | 0 refills | Status: DC

## 2023-10-14 NOTE — Progress Notes (Unsigned)
    Subjective:    Patient ID: Felicia Ray, female   DOB: 27-Dec-1965, 58 y.o.   MRN: [id number]   HPI  Symptoms of burning throat pain, bodyaches--more so in chest and back, hoarseness, lot of mucous and non productive cough.  She feels she has chest involvement and having some dyspnea.  Also, with headache No fever No nausea, vomiting or diarrhea.   Able to drink well.   White mucous from nose Cough productive of dark yellow mucous starting 4 days ago.   COVID and influenza testing were negative the second day of symptoms. Used Robitussin DM without improvement.   Has been also using Ibuprofen 400 mg every 8 hours without much improvement.    Current Meds  Medication Sig   albuterol  (VENTOLIN  HFA) 108 (90 Base) MCG/ACT inhaler Inhale 1-2 puffs into the lungs every 6 (six) hours as needed for wheezing or shortness of breath.   atorvastatin  (LIPITOR) 40 MG tablet 1 tab by mouth with evening meal daily.   dicyclomine  (BENTYL ) 20 MG tablet 1 tab before meals as needed for bloating and pain   Allergies  Allergen Reactions   Aspirin Anaphylaxis    "Almost died after she was given this- felt like a heart attack"   Dipyrone Anaphylaxis and Hives    "Almost died after she was given this- felt like a heart attack" (also)   Dipyrone Hives   Other     metamizole - blindness, n/v (an international drug)     Review of Systems    Objective:   BP 112/82 (BP Location: Left Arm, Patient Position: Sitting, Cuff Size: Normal)   Pulse 74   Temp 98.3 F (36.8 C) (Oral)   Resp 16   Ht 4' 11.5" (1.511 m)   Wt 179 lb 8 oz (81.4 kg)   LMP  (LMP Unknown)   SpO2 98%   BMI 35.65 kg/m   Physical Exam   Assessment & Plan

## 2023-10-14 NOTE — Patient Instructions (Signed)
 Prednisone:    Day 1-4:  3 tabletas una vez al dia para 4 dias Day 5:  2 1/2 tabletas Day 6:  2 tabletas Day 7:  1 1/2 tabletas Day 8:  1 tableta Day 9:  1/2 tableta Day 10: termine

## 2023-10-20 ENCOUNTER — Telehealth: Payer: Self-pay

## 2023-10-20 ENCOUNTER — Encounter: Payer: Self-pay | Admitting: Gastroenterology

## 2023-10-20 NOTE — Telephone Encounter (Signed)
 Patient called today to give a report on how she is feeling per Dr. Zara Heymann request.  Patient reports she is feeling much better after she was last seen by Doctor,  Patient states she is not coughing at night any more , she continues to cough during that day but no too often.  Patient also reports she is no longer having headaches nor chest pain . Patient feels better.

## 2023-10-25 NOTE — Telephone Encounter (Signed)
Pcp is aware.  

## 2023-11-18 ENCOUNTER — Ambulatory Visit (INDEPENDENT_AMBULATORY_CARE_PROVIDER_SITE_OTHER): Payer: Self-pay | Admitting: Gastroenterology

## 2023-11-18 ENCOUNTER — Encounter: Payer: Self-pay | Admitting: Gastroenterology

## 2023-11-18 ENCOUNTER — Other Ambulatory Visit (INDEPENDENT_AMBULATORY_CARE_PROVIDER_SITE_OTHER): Payer: Self-pay

## 2023-11-18 VITALS — BP 130/80 | HR 69 | Ht 59.5 in | Wt 179.2 lb

## 2023-11-18 DIAGNOSIS — K921 Melena: Secondary | ICD-10-CM

## 2023-11-18 DIAGNOSIS — Z1211 Encounter for screening for malignant neoplasm of colon: Secondary | ICD-10-CM

## 2023-11-18 DIAGNOSIS — R195 Other fecal abnormalities: Secondary | ICD-10-CM

## 2023-11-18 DIAGNOSIS — R197 Diarrhea, unspecified: Secondary | ICD-10-CM

## 2023-11-18 DIAGNOSIS — R1012 Left upper quadrant pain: Secondary | ICD-10-CM

## 2023-11-18 DIAGNOSIS — R194 Change in bowel habit: Secondary | ICD-10-CM

## 2023-11-18 DIAGNOSIS — Z800 Family history of malignant neoplasm of digestive organs: Secondary | ICD-10-CM

## 2023-11-18 DIAGNOSIS — K760 Fatty (change of) liver, not elsewhere classified: Secondary | ICD-10-CM

## 2023-11-18 LAB — HIGH SENSITIVITY CRP: CRP, High Sensitivity: 1.67 mg/L (ref 0.000–5.000)

## 2023-11-18 LAB — IBC + FERRITIN
Ferritin: 110.6 ng/mL (ref 10.0–291.0)
Iron: 130 ug/dL (ref 42–145)
Saturation Ratios: 36.3 % (ref 20.0–50.0)
TIBC: 358.4 ug/dL (ref 250.0–450.0)
Transferrin: 256 mg/dL (ref 212.0–360.0)

## 2023-11-18 LAB — COMPREHENSIVE METABOLIC PANEL WITH GFR
ALT: 17 U/L (ref 0–35)
AST: 16 U/L (ref 0–37)
Albumin: 4.4 g/dL (ref 3.5–5.2)
Alkaline Phosphatase: 75 U/L (ref 39–117)
BUN: 16 mg/dL (ref 6–23)
CO2: 25 meq/L (ref 19–32)
Calcium: 9.2 mg/dL (ref 8.4–10.5)
Chloride: 102 meq/L (ref 96–112)
Creatinine, Ser: 0.68 mg/dL (ref 0.40–1.20)
GFR: 97.51 mL/min (ref >60.00)
Glucose, Bld: 93 mg/dL (ref 70–99)
Potassium: 4 meq/L (ref 3.5–5.1)
Sodium: 138 meq/L (ref 135–145)
Total Bilirubin: 0.8 mg/dL (ref 0.2–1.2)
Total Protein: 7.1 g/dL (ref 6.0–8.3)

## 2023-11-18 LAB — CBC WITH DIFFERENTIAL/PLATELET
Basophils Absolute: 0 10*3/uL (ref 0.0–0.1)
Basophils Relative: 0.6 % (ref 0.0–3.0)
Eosinophils Absolute: 0.2 10*3/uL (ref 0.0–0.7)
Eosinophils Relative: 2.8 % (ref 0.0–5.0)
HCT: 42.1 % (ref 36.0–46.0)
Hemoglobin: 14.3 g/dL (ref 12.0–15.0)
Lymphocytes Relative: 22.6 % (ref 12.0–46.0)
Lymphs Abs: 1.2 10*3/uL (ref 0.7–4.0)
MCHC: 34 g/dL (ref 30.0–36.0)
MCV: 86.6 fl (ref 78.0–100.0)
Monocytes Absolute: 0.4 10*3/uL (ref 0.1–1.0)
Monocytes Relative: 7.8 % (ref 3.0–12.0)
Neutro Abs: 3.6 10*3/uL (ref 1.4–7.7)
Neutrophils Relative %: 66.2 % (ref 43.0–77.0)
Platelets: 234 10*3/uL (ref 150.0–400.0)
RBC: 4.86 Mil/uL (ref 3.87–5.11)
RDW: 14 % (ref 11.5–15.5)
WBC: 5.5 10*3/uL (ref 4.0–10.5)

## 2023-11-18 LAB — LIPASE: Lipase: 20 U/L (ref 11.0–59.0)

## 2023-11-18 LAB — SEDIMENTATION RATE: Sed Rate: 14 mm/h (ref 0–30)

## 2023-11-18 LAB — TSH: TSH: 1.47 u[IU]/mL (ref 0.35–5.50)

## 2023-11-18 MED ORDER — NA SULFATE-K SULFATE-MG SULF 17.5-3.13-1.6 GM/177ML PO SOLN: ORAL | 0 refills | Status: DC

## 2023-11-18 NOTE — Patient Instructions (Addendum)
 Su proveedor le ha pedido que baje al stano para Education officer, environmental sus prcticas de laboratorio antes de salir hoy. Presione la tecla "B" en el ascensor. El laboratorio se encuentra en la primera puerta a la izquierda al salir del Medical sales representative.  Se le ha programado una endoscopia y E[employer] Goldman colonoscopia. Siga las instrucciones escritas que le entregaron en su visita de hoy.  Si usa  inhaladores (aunque solo sean necesarios), trigalos el da de su procedimiento.  NO TOMAR 7 DAS ANTES DE LA PRUEBA: Trulicity (dulaglutida) Ozempic, Wegovy (semaglutida) Mounjaro (tirzepatida) Bydureon Bcise (exanatida de liberacin prolongada)  NO TOMAR 1 DA ANTES DE SU PRUEBA: Rybelsus (semaglutida) Adlyxin (lixisenatida) Victoza (liraglutida) Byetta (exanatida)  ___________________________________________________________________________   Your provider has ordered "Diatherix" stool testing for you. You have received a kit from our office today containing all necessary supplies to complete this test. Please carefully read the stool collection instructions provided in the kit before opening the accompanying materials. In addition, be sure there is a label providing your full name and date of birth on the "puritan opti-swab" tube that is supplied in the kit (if you do not see a label with this information on your test tube, please make us  aware before test collection!). After completing the test, you should secure the purtian tube into the specimen biohazard bag. The [facility] E-Req sheet (including date and time of specimen collection) should be placed into the outside pocket of the specimen biohazard bag and returned to the [place] lab (basement floor of [facility]) within 3 days of collection. Please make sure to give the specimen to a staff member at the lab. DO NOT leave the specimen on the counter.   If the specimen date and time (can be found in the upper right boxed portion of the sheet)  are not filled out on the E-Req sheet, the test will NOT be performed.    Hemos enviado los siguientes medicamentos a su farmacia para que los recoja cuando le resulte conveniente: Suprep  _______________________________________________________  If your blood pressure at your visit was 140/90 or greater, please contact your primary care physician to follow up on this.  _______________________________________________________  If you are age 56 or older, your body mass index should be between 23-30. Your Body mass index is 35.59 kg/m. If this is out of the aforementioned range listed, please consider follow up with your Primary Care Provider.  If you are age 56 or younger, your body mass index should be between 19-25. Your Body mass index is 35.59 kg/m. If this is out of the aformentioned range listed, please consider follow up with your Primary Care Provider.   ________________________________________________________  The [facility] providers would like to encourage you to use MYCHART to communicate with providers for non-urgent requests or questions.  Due to long hold times on the telephone, sending your provider a message by [facility] may be a faster and more efficient way to get a response.  Please allow 48 business hours for a response.  Please remember that this is for non-urgent requests.  _______________________________________________________  Due to recent changes in healthcare laws, you may see the results of your imaging and laboratory studies on MyChart before your provider has had a chance to review them.  We understand that in some cases there may be results that are confusing or concerning to you. Not all laboratory results come back in the same time frame and the provider may be waiting for multiple results in order to interpret  others.  Please give us  48 hours in order for your provider to thoroughly review all the results before contacting the office for clarification of your  results.   Thank you for entrusting me with your care and choosing [facility].  Valiant Gaul PA-C

## 2023-11-18 NOTE — Progress Notes (Signed)
 Felicia Ray [id number] 12-21-1965   Chief Complaint: Abdominal bloating, diarrhea, positive FIT test  Referring Provider: Ronalee Cocking, MD Primary GI MD: Para Bold  HPI: Felicia Ray is a 58 y.o. female with past medical history of asthma, obesity, HLD, depression, PFO, PVCs who presents today for a complaint of abdominal pain, diarrhea, FOBT positive stool.    06/06/2023 Patient seen in the ED for complaint of upper abdominal pain, vomiting after eating "raw chicken" from church lunch service.  Labs reassuring without leukocytosis or electrolyte derangement.  Normal LFTs, alk phos, lipase.  Right upper quadrant ultrasound with no acute findings.  Discharged in stable condition on Zofran  and dicyclomine .  09/16/2023 Patient seen by PCP for complaint of left upper quadrant abdominal pain ongoing for about 3 months, bloating, and possible melena.  Pain was different from what she experienced in the ED months prior. Labs: Normal CBC, normal CMP, normal lipase.  FOBT positive.  She is referred to us  for colonoscopy +/- EGD.  Patient is Spanish-speaking and an interpreter assists with the visit today.  Patient states she has been having left upper quadrant abdominal pain for about a year, which is sometimes mild but can be severe.  Pain typically lasts about 15 minutes but this can vary.  She has not noticed any clear aggravating factors, though thinks that maybe she has more pain after eating beef.  Nothing alleviates her pain, but it goes away on its own.  In addition to LUQ pain, she can have burning epigastric pain that radiates to her entire upper abdomen.  She denies any burning chest pain or acid reflux.  Occasionally feels nauseated but denies vomiting.  In the last year she has noticed a change in her bowel habits, and is having 4-6 bowel movements per day, with stools being soft and occasionally loose but not watery.  She denies constipation, blood in  stool, rectal pain.  She denies prior endoscopic procedures.  Reports that her mother died last year of stomach cancer.  Denies family history of colon cancer or other GI disease.  Last visit with cardiology 04/27/2022 for PVCs.  Elevated PVC burden at 23%, was on flecainide  50 mg twice daily at that time and feeling improved with less palpitations.  QRS remained narrow.  Has had cardiac monitoring, Myoview , echocardiogram.  States she no longer follows with cardiology but is doing well, denies chest pain, shortness of breath, or palpitations.   Previous GI Procedures/Imaging   RUQ US  06/06/2023 - No acute findings. - Question slight/early hepatic steatosis.   Past Medical History:  Diagnosis Date   Asthma    Chest pain    Chronic pain syndrome 01/23/2014   Fall by slipping on water  at work.  Worker's Comp case.  Followed by Dr. Tonny Fredericks,  and [facility].  Waiting for second opinion.  Not clear why left low back and knee are not improving.  Called neuropathic pain by Dr. Rexanne Catalina.   Environmental and seasonal allergies    Frequent PVCs    HLD (hyperlipidemia)    PFO (patent foramen ovale) 04/03/2021   Echo with Dr. Alroy Aspen with predominantly L>R shunt.    Past Surgical History:  Procedure Laterality Date   TUBAL LIGATION  07/20/2005    Current Outpatient Medications  Medication Sig Dispense Refill   albuterol  (VENTOLIN  HFA) 108 (90 Base) MCG/ACT inhaler Inhale 1-2 puffs into the lungs every 6 (six) hours as needed for wheezing or shortness of breath.  8 g 1   amoxicillin -clavulanate (AUGMENTIN ) 875-125 MG tablet 1 tab by mouth twice daily for 7 days. 14 tablet 0   atorvastatin  (LIPITOR) 40 MG tablet 1 tab by mouth with evening meal daily. 30 tablet 11   benzonatate  (TESSALON ) 200 MG capsule Take 1 capsule (200 mg total) by mouth 2 (two) times daily as needed for cough. 15 capsule 0   cyclobenzaprine  (FLEXERIL ) 10 MG tablet Take 1 tablet (10 mg total) by mouth 2 (two) times  daily as needed for muscle spasms. (Patient not taking: Reported on 10/14/2023) 12 tablet 0   dicyclomine  (BENTYL ) 20 MG tablet 1 tab before meals as needed for bloating and pain 30 tablet 6   flecainide  (TAMBOCOR ) 50 MG tablet Take 1 tablet (50 mg total) by mouth 2 (two) times daily. (Patient not taking: Reported on 10/14/2023) 60 tablet 3   metoprolol  tartrate (LOPRESSOR ) 25 MG tablet Take 0.5 tablets (12.5 mg total) by mouth 2 (two) times daily. (Patient not taking: Reported on 10/14/2023) 90 tablet 3   Omega-3 Fatty Acids (FISH OIL PO) Take 1 capsule by mouth daily. (Patient not taking: Reported on 11/26/2022)     ondansetron  (ZOFRAN -ODT) 4 MG disintegrating tablet Take 1 tablet (4 mg total) by mouth every 8 (eight) hours as needed for nausea. (Patient not taking: Reported on 10/14/2023) 10 tablet 0   predniSONE  (DELTASONE ) 10 MG tablet 3 tabs by mouth daily for 4 days then 2.5 tabs Day5  2 tabs Day 6 1.5 tabs Day 7 1 tab Day 8  1/2 tab Day9  stop Day 10 20 tablet 0   No current facility-administered medications for this visit.    Allergies as of 11/18/2023 - Review Complete 10/14/2023  Allergen Reaction Noted   Aspirin Anaphylaxis 01/12/2019   Dipyrone Anaphylaxis and Hives 01/12/2019   Dipyrone Hives 08/26/2016   Other  03/04/2012    Family History  Problem Relation Age of Onset   Cancer Mother 26       Gastric cancer   Hypertension Mother    Asthma Mother    Hyperlipidemia Mother    Obesity Mother    Heart disease Mother        CHF by description   Diabetes Sister    Hypertension Sister    Hyperlipidemia Sister    Hypertension Brother    Hyperlipidemia Brother    Cervical cancer Maternal Grandmother     Social History   Tobacco Use   Smoking status: Never    Passive exposure: Never   Smokeless tobacco: Never  Vaping Use   Vaping status: Never Used  Substance Use Topics   Alcohol use: Never   Drug use: Never     Review of Systems:    Constitutional: No weight loss,  fever, chills, weakness or fatigue Skin: No rash or itching Cardiovascular: No chest pain, chest pressure or palpitations   Respiratory: No SOB or cough Gastrointestinal: See HPI and otherwise negative Genitourinary: No dysuria or change in urinary frequency Neurological: No headache, dizziness or syncope Musculoskeletal: No new muscle or joint pain Hematologic: No bleeding or bruising    Physical Exam:  Vital signs: BP 130/80   Pulse 69   Ht 4' 11.5" (1.511 m)   Wt 179 lb 3 oz (81.3 kg)   LMP  (LMP Unknown)   SpO2 97%   BMI 35.59 kg/m    Constitutional: NAD, Well developed, Well nourished, alert and cooperative Head:  Normocephalic and atraumatic.  Eyes: No scleral icterus. Conjunctiva  pink. Mouth: No oral lesions. Respiratory: Respirations even and unlabored. Lungs clear to auscultation bilaterally.  No wheezes, crackles, or rhonchi.  Cardiovascular:  Regular rate and rhythm. No murmurs. No peripheral edema. Gastrointestinal:  Soft, nondistended, nontender. No rebound or guarding. Normal bowel sounds. No appreciable masses or hepatomegaly. Rectal:  Deferred to colonoscopy. Neurologic:  Alert and oriented x4;  grossly normal neurologically.  Skin:   Dry and intact without significant lesions or rashes. Psychiatric: Oriented to person, place and time. Demonstrates good judgement and reason without abnormal affect or behaviors.   RELEVANT LABS AND IMAGING: CBC    Component Value Date/Time   WBC 5.7 09/16/2023 1450   WBC 10.6 (H) 06/06/2023 2111   RBC 4.58 09/16/2023 1450   RBC 4.95 06/06/2023 2111   HGB 14.0 09/16/2023 1450   HCT 41.6 09/16/2023 1450   PLT 226 09/16/2023 1450   MCV 91 09/16/2023 1450   MCH 30.6 09/16/2023 1450   MCH 30.1 06/06/2023 2111   MCHC 33.7 09/16/2023 1450   MCHC 34.3 06/06/2023 2111   RDW 14.0 09/16/2023 1450   LYMPHSABS 1.6 09/16/2023 1450   MONOABS 0.5 01/12/2019 1645   EOSABS 0.2 09/16/2023 1450   BASOSABS 0.0 09/16/2023 1450     CMP     Component Value Date/Time   NA 138 09/16/2023 1450   K 4.4 09/16/2023 1450   CL 100 09/16/2023 1450   CO2 21 09/16/2023 1450   GLUCOSE 84 09/16/2023 1450   GLUCOSE 110 (H) 06/06/2023 2111   BUN 11 09/16/2023 1450   CREATININE 0.86 09/16/2023 1450   CALCIUM  9.2 09/16/2023 1450   PROT 6.8 09/16/2023 1450   ALBUMIN 4.2 09/16/2023 1450   AST 21 09/16/2023 1450   ALT 17 09/16/2023 1450   ALKPHOS 95 09/16/2023 1450   BILITOT 0.3 09/16/2023 1450   GFRNONAA >60 06/06/2023 2111   GFRAA 108 07/27/2019 0932   Echocardiogram 12/01/2021 1. Left ventricular ejection fraction, by estimation, is 50 to 55% . Left ventricular ejection fraction by 3D volume is 50 % . The left ventricle has low normal function. Left ventricular endocardial border not optimally defined to evaluate regional wall motion. Left ventricular diastolic parameters are consistent with Grade I diastolic dysfunction ( impaired relaxation) .  2. Right ventricular systolic function is normal. The right ventricular size is normal. There is normal pulmonary artery systolic pressure. The estimated right ventricular systolic pressure is 21. 0 mmHg.  3. The mitral valve is normal in structure. Trivial mitral valve regurgitation. No evidence of mitral stenosis.  4. The aortic valve is tricuspid. Aortic valve regurgitation is trivial. No aortic stenosis is present.  5. The inferior vena cava is normal in size with greater than 50% respiratory variability, suggesting right atrial pressure of 3 mmHg.  Assessment/Plan:   Abdominal pain - LUQ/upper abdomen Change in bowel habits Frequent loose stools FOBT positive stool Family history of stomach cancer (mother, died last year) Patient with about 1 year of change in bowel habits, upper abdominal pain, possible melena, with recent finding of FOBT positive stool, to discuss colonoscopy and possibly EGD as well.  She has been having intermittent upper abdominal pain, primarily in the  LUQ, but can have epigastric and RUQ pain as well.  Often burning in nature.  Not having acid reflux or heartburn.  RUQ US  unremarkable aside from possible early hepatic steatosis. She has a family history significant for stomach cancer in her mother. She has had no prior endoscopic procedures or colon cancer  screening.  - Will schedule EGD/colonoscopy. I thoroughly discussed the procedure with the patient to include nature of the procedure, alternatives, benefits, and risks (including but not limited to bleeding, infection, perforation, anesthesia/cardiac/pulmonary complications). Patient verbalized understanding and gave verbal consent to proceed with procedure.  - Check labs: CBC, CMP, CRP, ESR, TTG, IgA, iron/ferritin, diatherix H. Pylori  - Will treat H. pylori if positive - Consider PPI  Valiant Gaul, PA-C [facility] 11/18/2023, 10:28 AM  Patient Care Team: Ronalee Cocking, MD as PCP - General (Internal Medicine) Nahser, Lela Purple, MD as PCP - Cardiology (Cardiology) Lei Pump, MD as PCP - Electrophysiology (Cardiology) Ronalee Cocking, MD (Internal Medicine)

## 2023-11-19 LAB — TISSUE TRANSGLUTAMINASE, IGA: (tTG) Ab, IgA: <1 U/mL

## 2023-11-19 LAB — IGA: Immunoglobulin A: 205 mg/dL (ref 47–310)

## 2023-11-22 ENCOUNTER — Ambulatory Visit: Payer: Self-pay | Admitting: Gastroenterology

## 2023-11-25 ENCOUNTER — Other Ambulatory Visit: Payer: Self-pay

## 2023-11-25 DIAGNOSIS — E66812 Obesity, class 2: Secondary | ICD-10-CM

## 2023-11-25 DIAGNOSIS — E782 Mixed hyperlipidemia: Secondary | ICD-10-CM

## 2023-11-26 ENCOUNTER — Other Ambulatory Visit: Payer: Self-pay

## 2023-11-26 LAB — COMPREHENSIVE METABOLIC PANEL WITH GFR
ALT: 12 IU/L (ref 0–32)
AST: 16 IU/L (ref 0–40)
Albumin: 4.4 g/dL (ref 3.8–4.9)
Alkaline Phosphatase: 90 IU/L (ref 44–121)
BUN/Creatinine Ratio: 18 (ref 9–23)
BUN: 13 mg/dL (ref 6–24)
Bilirubin Total: 0.4 mg/dL (ref 0.0–1.2)
CO2: 24 mmol/L (ref 20–29)
Calcium: 9.3 mg/dL (ref 8.7–10.2)
Chloride: 102 mmol/L (ref 96–106)
Creatinine, Ser: 0.72 mg/dL (ref 0.57–1.00)
Globulin, Total: 2.6 g/dL (ref 1.5–4.5)
Glucose: 94 mg/dL (ref 70–99)
Potassium: 4.1 mmol/L (ref 3.5–5.2)
Sodium: 139 mmol/L (ref 134–144)
Total Protein: 7 g/dL (ref 6.0–8.5)
eGFR: 98 mL/min/{1.73_m2} (ref >59)

## 2023-11-26 LAB — LIPID PANEL W/O CHOL/HDL RATIO
Cholesterol, Total: 253 mg/dL — ABNORMAL HIGH (ref 100–199)
HDL: 32 mg/dL — ABNORMAL LOW (ref >39)
LDL Chol Calc (NIH): 123 mg/dL — ABNORMAL HIGH (ref 0–99)
Triglycerides: 542 mg/dL — ABNORMAL HIGH (ref 0–149)
VLDL Cholesterol Cal: 98 mg/dL — ABNORMAL HIGH (ref 5–40)

## 2023-12-08 ENCOUNTER — Telehealth: Payer: Self-pay | Admitting: Gastroenterology

## 2023-12-08 NOTE — Telephone Encounter (Signed)
 Spoke with patient via Spanish interpreter # [phone]. Relayed results. Patient verbalized understanding. All questions answered.

## 2023-12-08 NOTE — Telephone Encounter (Signed)
 Diatherix H. pylori result received.  Negative for H. pylori 12/06/2023.

## 2023-12-21 ENCOUNTER — Encounter: Payer: Self-pay | Admitting: Gastroenterology

## 2024-01-03 ENCOUNTER — Ambulatory Visit: Payer: Self-pay | Admitting: Internal Medicine

## 2024-01-03 ENCOUNTER — Telehealth: Payer: Self-pay | Admitting: Gastroenterology

## 2024-01-03 NOTE — Telephone Encounter (Signed)
 Spoke to patient with interpreter on the line advise updated prep and instructions is on the 2nd floor ready for her to pick up.

## 2024-01-03 NOTE — Telephone Encounter (Signed)
 Inbound call from [facility] on [address] stating patient went to take out prep prescription and pharmacy does not carry Suprep but carry Gavilyte C. Wanted to know if they can be switched to that prep medication since that's what they carry  Patient is set for their procedure 01/10/24  Please advise  Thank you

## 2024-01-10 ENCOUNTER — Ambulatory Visit: Payer: Self-pay | Admitting: Gastroenterology

## 2024-01-10 ENCOUNTER — Other Ambulatory Visit: Payer: Self-pay | Admitting: Gastroenterology

## 2024-01-10 ENCOUNTER — Encounter: Payer: Self-pay | Admitting: Gastroenterology

## 2024-01-10 VITALS — BP 127/78 | HR 64 | Temp 97.6°F | Resp 11 | Ht 59.0 in | Wt 179.0 lb

## 2024-01-10 DIAGNOSIS — K449 Diaphragmatic hernia without obstruction or gangrene: Secondary | ICD-10-CM

## 2024-01-10 DIAGNOSIS — Z800 Family history of malignant neoplasm of digestive organs: Secondary | ICD-10-CM

## 2024-01-10 DIAGNOSIS — K648 Other hemorrhoids: Secondary | ICD-10-CM

## 2024-01-10 DIAGNOSIS — R197 Diarrhea, unspecified: Secondary | ICD-10-CM

## 2024-01-10 DIAGNOSIS — K644 Residual hemorrhoidal skin tags: Secondary | ICD-10-CM

## 2024-01-10 DIAGNOSIS — D125 Benign neoplasm of sigmoid colon: Secondary | ICD-10-CM

## 2024-01-10 DIAGNOSIS — B9681 Helicobacter pylori [H. pylori] as the cause of diseases classified elsewhere: Secondary | ICD-10-CM

## 2024-01-10 DIAGNOSIS — R195 Other fecal abnormalities: Secondary | ICD-10-CM

## 2024-01-10 DIAGNOSIS — K2950 Unspecified chronic gastritis without bleeding: Secondary | ICD-10-CM

## 2024-01-10 DIAGNOSIS — R140 Abdominal distension (gaseous): Secondary | ICD-10-CM

## 2024-01-10 DIAGNOSIS — K2970 Gastritis, unspecified, without bleeding: Secondary | ICD-10-CM

## 2024-01-10 DIAGNOSIS — K621 Rectal polyp: Secondary | ICD-10-CM

## 2024-01-10 MED ORDER — SODIUM CHLORIDE 0.9 % IV SOLN: 500.0000 mL | Freq: Once | INTRAVENOUS | Status: AC

## 2024-01-10 NOTE — Progress Notes (Signed)
 Pt admitted with assistance from interpreter Raquel

## 2024-01-10 NOTE — Progress Notes (Signed)
1443 Robinul 0.1 mg IV given due large amount of secretions upon assessment.  MD made aware, vss

## 2024-01-10 NOTE — Progress Notes (Signed)
 [facility] History and Physical   Primary Care Physician:  Adella Norris, MD   Reason for Procedure:  Positive FIT, abdominal bloating, diarrhea  Plan:    EGD and colonoscopy with possible interventions as needed     HPI: Felicia Ray is a very pleasant 58 y.o. female here for EGD and colonoscopy for Positive FIT, abdominal bloating, diarrhea. Family h/o gastric cancer in mother   The risks and benefits as well as alternatives of endoscopic procedure(s) have been discussed and reviewed. All questions answered. The patient agrees to proceed.    Past Medical History:  Diagnosis Date   Asthma    Chest pain    Chronic pain syndrome 01/23/2014   Fall by slipping on water  at work.  Worker's Comp case.  Followed by Dr. Duwayne Bailey,  and [facility].  Waiting for second opinion.  Not clear why left low back and knee are not improving.  Called neuropathic pain by Dr. Bonner.   Depression    Environmental and seasonal allergies    Frequent PVCs    HLD (hyperlipidemia)    PFO (patent foramen ovale) 04/03/2021   Echo with Dr. Alveta with predominantly L>R shunt.    Past Surgical History:  Procedure Laterality Date   TUBAL LIGATION  07/20/2005    Prior to Admission medications   Medication Sig Start Date End Date Taking? Authorizing Provider  albuterol  (VENTOLIN  HFA) 108 (90 Base) MCG/ACT inhaler Inhale 1-2 puffs into the lungs every 6 (six) hours as needed for wheezing or shortness of breath. 10/14/23   Adella Norris, MD  atorvastatin  (LIPITOR) 40 MG tablet 1 tab by mouth with evening meal daily. 09/16/23   Adella Norris, MD  dicyclomine  (BENTYL ) 20 MG tablet 1 tab before meals as needed for bloating and pain 10/05/23   Adella Norris, MD  flecainide  (TAMBOCOR ) 50 MG tablet Take 1 tablet (50 mg total) by mouth 2 (two) times daily. Patient not taking: Reported on 11/26/2022 04/27/22   Inocencio Soyla Lunger, MD  metoprolol  tartrate  (LOPRESSOR ) 25 MG tablet Take 0.5 tablets (12.5 mg total) by mouth 2 (two) times daily. Patient not taking: Reported on 01/10/2024 05/05/21   Nahser, Aleene PARAS, MD  Omega-3 Fatty Acids (FISH OIL PO) Take 1 capsule by mouth daily. Patient not taking: Reported on 01/10/2024    [provider]  ondansetron  (ZOFRAN -ODT) 4 MG disintegrating tablet Take 1 tablet (4 mg total) by mouth every 8 (eight) hours as needed for nausea. 06/07/23   Jarold Olam HERO, PA-C  cetirizine  (ZYRTEC ) 10 MG tablet Take 1 tablet (10 mg total) by mouth daily. Patient not taking: Reported on 02/09/2019 08/04/18 07/26/19  Adella Norris, MD    Current Outpatient Medications  Medication Sig Dispense Refill   albuterol  (VENTOLIN  HFA) 108 (90 Base) MCG/ACT inhaler Inhale 1-2 puffs into the lungs every 6 (six) hours as needed for wheezing or shortness of breath. 8 g 1   atorvastatin  (LIPITOR) 40 MG tablet 1 tab by mouth with evening meal daily. 30 tablet 11   dicyclomine  (BENTYL ) 20 MG tablet 1 tab before meals as needed for bloating and pain 30 tablet 6   flecainide  (TAMBOCOR ) 50 MG tablet Take 1 tablet (50 mg total) by mouth 2 (two) times daily. (Patient not taking: Reported on 11/26/2022) 60 tablet 3   metoprolol  tartrate (LOPRESSOR ) 25 MG tablet Take 0.5 tablets (12.5 mg total) by mouth 2 (two) times daily. (Patient not taking: Reported on 01/10/2024) 90 tablet 3  Omega-3 Fatty Acids (FISH OIL PO) Take 1 capsule by mouth daily. (Patient not taking: Reported on 01/10/2024)     ondansetron  (ZOFRAN -ODT) 4 MG disintegrating tablet Take 1 tablet (4 mg total) by mouth every 8 (eight) hours as needed for nausea. 10 tablet 0   Current Facility-Administered Medications  Medication Dose Route Frequency Provider Last Rate Last Admin   0.9 %  sodium chloride  infusion  500 mL Intravenous Once Quaneisha Hanisch V, MD        Allergies as of 01/10/2024 - Review Complete 01/10/2024  Allergen Reaction Noted   Aspirin Anaphylaxis  01/12/2019   Dipyrone Anaphylaxis and Hives 01/12/2019   Dipyrone Hives 08/26/2016   Other Other (See Comments) 03/04/2012    Family History  Problem Relation Age of Onset   Stomach cancer Mother    Cancer Mother 26       Gastric cancer   Hypertension Mother    Asthma Mother    Hyperlipidemia Mother    Obesity Mother    Heart disease Mother        CHF by description   Diabetes Sister    Hypertension Sister    Hyperlipidemia Sister    Hypertension Brother    Hyperlipidemia Brother    Cervical cancer Maternal Grandmother    Stomach cancer Maternal Cousin    Colon cancer Neg Hx    Esophageal cancer Neg Hx     Social History   Socioeconomic History   Marital status: Single    Spouse name: Not on file   Number of children: 2   Years of education: Not on file   Highest education level: 12th grade  Occupational History   Occupation: Works at [employer]  Tobacco Use   Smoking status: Never    Passive exposure: Never   Smokeless tobacco: Never  Vaping Use   Vaping status: Never Used  Substance and Sexual Activity   Alcohol use: Never   Drug use: Never   Sexual activity: Not Currently    Birth control/protection: Surgical  Other Topics Concern   Not on file  Social History Narrative      Originally from [place]   [place]   Divorced husband in [place] due to his alcohol abuse.    He did not abuse her.   LIves at home with her daughter and younger son, but no longer with who was a long term boyfriend.     Father of daughter developed alcohol issues and does not help with financial support..   Father of her son was deported    Social Drivers of Corporate investment banker Strain: High Risk (11/26/2022)   Overall Financial Resource Strain (CARDIA)    Difficulty of Paying Living Expenses: Hard  Food Insecurity: No Food Insecurity (07/03/2021)   Hunger Vital Sign    Worried About Running Out of Food in the Last Year: Never true    Ran Out of Food in the  Last Year: Never true  Transportation Needs: No Transportation Needs (11/26/2022)   PRAPARE - Administrator, Civil Service (Medical): No    Lack of Transportation (Non-Medical): No  Physical Activity: Not on file  Stress: Not on file  Social Connections: Not on file  Intimate Partner Violence: Not on file    Review of Systems:  All other review of systems negative except as mentioned in the HPI.  Physical Exam: Vital signs in last 24 hours: BP 105/66   Pulse 67  Temp 97.6 F (36.4 C)   Ht 4' 11 (1.499 m)   Wt 179 lb (81.2 kg)   LMP  (LMP Unknown)   SpO2 98%   BMI 36.15 kg/m  General:   Alert, NAD Lungs:  Clear .   Heart:  Regular rate and rhythm Abdomen:  Soft, nontender and nondistended. Neuro/Psych:  Alert and cooperative. Normal mood and affect. A and O x 3  Reviewed labs, radiology imaging, old records and pertinent past GI work up  Patient is appropriate for planned procedure(s) and anesthesia in an ambulatory setting   K. Veena Kadiatou Oplinger , MD [phone]

## 2024-01-10 NOTE — Op Note (Signed)
 [facility] Patient Name: Felicia Ray [facility] [name] Procedure Date: 01/10/2024 2:40 PM MRN: [id number] Endoscopist: Gustav ALONSO Mcgee , MD, [id number] Age: 58 Referring MD:  Date of Birth: April 08, 1966 Gender: Female Account #: [id number] Procedure:                Colonoscopy Indications:              Evaluation of unexplained GI bleeding presenting                            with fecal occult blood, Positive fecal                            immunochemical test Medicines:                Monitored Anesthesia Care Procedure:                Pre-Anesthesia Assessment:                           - Prior to the procedure, a History and Physical                            was performed, and patient medications and                            allergies were reviewed. The patient's tolerance of                            previous anesthesia was also reviewed. The risks                            and benefits of the procedure and the sedation                            options and risks were discussed with the patient.                            All questions were answered, and informed consent                            was obtained. Prior Anticoagulants: The patient has                            taken no anticoagulant or antiplatelet agents. ASA                            Grade Assessment: III - A patient with severe                            systemic disease. After reviewing the risks and                            benefits, the patient was deemed in satisfactory  condition to undergo the procedure.                           After obtaining informed consent, the colonoscope                            was passed under direct vision. Throughout the                            procedure, the patient's blood pressure, pulse, and                            oxygen saturations were monitored continuously. The                            Olympus Scope [id number] was  introduced through the                            anus and advanced to the the cecum, identified by                            appendiceal orifice and ileocecal valve. The                            colonoscopy was performed without difficulty. The                            patient tolerated the procedure well. The quality                            of the bowel preparation was good. The ileocecal                            valve, appendiceal orifice, and rectum were                            photographed. Scope In: 3:02:01 PM Scope Out: 3:15:45 PM Scope Withdrawal Time: 0 hours 10 minutes 47 seconds  Total Procedure Duration: 0 hours 13 minutes 44 seconds  Findings:                 The perianal and digital rectal examinations were                            normal.                           A 5 mm polyp was found in the sigmoid colon. The                            polyp was sessile. The polyp was removed with a                            cold snare. Resection and retrieval were complete.  An 11 mm polyp was found in the rectum. The polyp                            was semi-pedunculated. The polyp was removed with a                            cold snare. Resection and retrieval were complete.                            To prevent bleeding after the polypectomy, one                            hemostatic clip was successfully placed. There was                            no bleeding at the end of the procedure.                           Non-bleeding external and internal hemorrhoids were                            found during retroflexion. The hemorrhoids were                            medium-sized. Complications:            No immediate complications. Estimated Blood Loss:     Estimated blood loss was minimal. Impression:               - One 5 mm polyp in the sigmoid colon, removed with                            a cold snare. Resected and retrieved.                            - One 11 mm polyp in the rectum, removed with a                            cold snare. Resected and retrieved. Clip was placed.                           - Non-bleeding external and internal hemorrhoids. Recommendation:           - Patient has a contact number available for                            emergencies. The signs and symptoms of potential                            delayed complications were discussed with the                            patient. Return to normal activities tomorrow.  Written discharge instructions were provided to the                            patient.                           - Resume previous diet.                           - Continue present medications.                           - Await pathology results.                           - Repeat colonoscopy in 3 - 5 years for                            surveillance based on pathology results. Chester Sibert V. Takesha Steger, MD 01/10/2024 3:23:16 PM This report has been signed electronically.

## 2024-01-10 NOTE — Progress Notes (Signed)
 Report given to PACU, vss

## 2024-01-10 NOTE — Op Note (Signed)
 [facility] Patient Name: Felicia Ray Felicia Ray Felicia Ray Procedure Date: 01/10/2024 2:40 PM MRN: [id number] Endoscopist: Gustav ALONSO Mcgee , MD, [id number] Age: 58 Referring MD:  Date of Birth: 1965-06-30 Gender: Female Account #: [id number] Procedure:                Upper GI endoscopy Indications:              Abdominal distention, Abdominal bloating, Diarrhea,                            Family history of gastric cancer Medicines:                Monitored Anesthesia Care Procedure:                Pre-Anesthesia Assessment:                           - Prior to the procedure, a History and Physical                            was performed, and patient medications and                            allergies were reviewed. The patient's tolerance of                            previous anesthesia was also reviewed. The risks                            and benefits of the procedure and the sedation                            options and risks were discussed with the patient.                            All questions were answered, and informed consent                            was obtained. Prior Anticoagulants: The patient has                            taken no anticoagulant or antiplatelet agents. ASA                            Grade Assessment: III - A patient with severe                            systemic disease. After reviewing the risks and                            benefits, the patient was deemed in satisfactory                            condition to undergo the procedure.  After obtaining informed consent, the endoscope was                            passed under direct vision. Throughout the                            procedure, the patient's blood pressure, pulse, and                            oxygen saturations were monitored continuously. The                            Olympus Scope [id number] was introduced through the                             mouth, and advanced to the second part of duodenum.                            The upper GI endoscopy was accomplished without                            difficulty. The patient tolerated the procedure                            well. Scope In: Scope Out: Findings:                 No gross lesions were noted in the entire esophagus.                           A small hiatal hernia was present.                           Scattered mild inflammation characterized by                            congestion (edema), erythema and friability was                            found in the cardia, in the gastric fundus and in                            the gastric body. Biopsies were taken with a cold                            forceps for histology. Biopsies were taken with a                            cold forceps for Helicobacter pylori testing.                           The examined duodenum was normal. Complications:            No immediate complications. Estimated Blood Loss:     Estimated blood loss was minimal. Impression:               -  No gross lesions in the entire esophagus.                           - Small hiatal hernia.                           - Gastritis. Biopsied.                           - Normal examined duodenum. Recommendation:           - Patient has a contact number available for                            emergencies. The signs and symptoms of potential                            delayed complications were discussed with the                            patient. Return to normal activities tomorrow.                            Written discharge instructions were provided to the                            patient.                           - Resume previous diet.                           - Continue present medications.                           - Await pathology results. Felicia Ray V. Felicia Divelbiss, MD 01/10/2024 3:26:00 PM This report has been signed electronically.

## 2024-01-10 NOTE — Progress Notes (Signed)
 Called to room to assist during endoscopic procedure.  Patient ID and intended procedure confirmed with present staff. Received instructions for my participation in the procedure from the performing physician.

## 2024-01-10 NOTE — Patient Instructions (Signed)
 Resume previous diet and medications. Awaiting pathology results. Repeat Colonoscopy in 3-5 years for surveillance based on pathology results.  YOU HAD AN ENDOSCOPIC PROCEDURE TODAY AT THE [facility]:   Refer to the procedure report that was given to you for any specific questions about what was found during the examination.  If the procedure report does not answer your questions, please call your gastroenterologist to clarify.  If you requested that your care partner not be given the details of your procedure findings, then the procedure report has been included in a sealed envelope for you to review at your convenience later.  YOU SHOULD EXPECT: Some feelings of bloating in the abdomen. Passage of more gas than usual.  Walking can help get rid of the air that was put into your GI tract during the procedure and reduce the bloating. If you had a lower endoscopy (such as a colonoscopy or flexible sigmoidoscopy) you may notice spotting of blood in your stool or on the toilet paper. If you underwent a bowel prep for your procedure, you may not have a normal bowel movement for a few days.  Please Note:  You might notice some irritation and congestion in your nose or some drainage.  This is from the oxygen used during your procedure.  There is no need for concern and it should clear up in a day or so.  SYMPTOMS TO REPORT IMMEDIATELY:  Following lower endoscopy (colonoscopy or flexible sigmoidoscopy):  Excessive amounts of blood in the stool  Significant tenderness or worsening of abdominal pains  Swelling of the abdomen that is new, acute  Fever of 100F or higher  Following upper endoscopy (EGD)  Vomiting of blood or coffee ground material  New chest pain or pain under the shoulder blades  Painful or persistently difficult swallowing  New shortness of breath  Fever of 100F or higher  Black, tarry-looking stools  For urgent or emergent issues, a gastroenterologist can be reached at  any hour by calling [phone]. Do not use MyChart messaging for urgent concerns.    DIET:  We do recommend a small meal at first, but then you may proceed to your regular diet.  Drink plenty of fluids but you should avoid alcoholic beverages for 24 hours.  ACTIVITY:  You should plan to take it easy for the rest of today and you should NOT DRIVE or use heavy machinery until tomorrow (because of the sedation medicines used during the test).    FOLLOW UP: Our staff will call the number listed on your records the next business day following your procedure.  We will call around 7:15- 8:00 am to check on you and address any questions or concerns that you may have regarding the information given to you following your procedure. If we do not reach you, we will leave a message.     If any biopsies were taken you will be contacted by phone or by letter within the next 1-3 weeks.  Please call us  at [phone] if you have not heard about the biopsies in 3 weeks.    SIGNATURES/CONFIDENTIALITY: You and/or your care partner have signed paperwork which will be entered into your electronic medical record.  These signatures attest to the fact that that the information above on your After Visit Summary has been reviewed and is understood.  Full responsibility of the confidentiality of this discharge information lies with you and/or your care-partner.

## 2024-01-11 ENCOUNTER — Telehealth: Payer: Self-pay

## 2024-01-11 NOTE — Telephone Encounter (Signed)
  Follow up Call-     01/10/2024    1:51 PM  Call back number  Post procedure Call Back phone  # [phone] (use interpreter line)  Permission to leave phone message Yes     Patient questions:  Do you have a fever, pain , or abdominal swelling? No. Pain Score  0 *  Have you tolerated food without any problems? Yes.    Have you been able to return to your normal activities? Yes.    Do you have any questions about your discharge instructions: Diet   No. Medications  No. Follow up visit  No.  Do you have questions or concerns about your Care? No.  Actions: * If pain score is 4 or above: No action needed, pain <4.

## 2024-01-13 LAB — SURGICAL PATHOLOGY

## 2024-02-24 ENCOUNTER — Encounter: Payer: Self-pay | Admitting: Internal Medicine

## 2024-03-16 ENCOUNTER — Other Ambulatory Visit: Payer: Self-pay

## 2024-03-16 DIAGNOSIS — Z6835 Body mass index (BMI) 35.0-35.9, adult: Secondary | ICD-10-CM

## 2024-03-16 DIAGNOSIS — E782 Mixed hyperlipidemia: Secondary | ICD-10-CM

## 2024-03-17 LAB — COMPREHENSIVE METABOLIC PANEL WITH GFR
ALT: 15 IU/L (ref 0–32)
AST: 16 IU/L (ref 0–40)
Albumin: 4.2 g/dL (ref 3.8–4.9)
Alkaline Phosphatase: 85 IU/L (ref 49–135)
BUN/Creatinine Ratio: 15 (ref 9–23)
BUN: 10 mg/dL (ref 6–24)
Bilirubin Total: 0.5 mg/dL (ref 0.0–1.2)
CO2: 24 mmol/L (ref 20–29)
Calcium: 9.2 mg/dL (ref 8.7–10.2)
Chloride: 100 mmol/L (ref 96–106)
Creatinine, Ser: 0.66 mg/dL (ref 0.57–1.00)
Globulin, Total: 2.4 g/dL (ref 1.5–4.5)
Glucose: 79 mg/dL (ref 70–99)
Potassium: 4.2 mmol/L (ref 3.5–5.2)
Sodium: 138 mmol/L (ref 134–144)
Total Protein: 6.6 g/dL (ref 6.0–8.5)
eGFR: 103 mL/min/1.73 (ref >59)

## 2024-03-17 LAB — LIPID PANEL
Chol/HDL Ratio: 7.5 ratio — ABNORMAL HIGH (ref 0.0–4.4)
Cholesterol, Total: 234 mg/dL — ABNORMAL HIGH (ref 100–199)
HDL: 31 mg/dL — ABNORMAL LOW (ref >39)
LDL Chol Calc (NIH): 123 mg/dL — ABNORMAL HIGH (ref 0–99)
Triglycerides: 451 mg/dL — ABNORMAL HIGH (ref 0–149)
VLDL Cholesterol Cal: 80 mg/dL — ABNORMAL HIGH (ref 5–40)

## 2024-04-16 ENCOUNTER — Ambulatory Visit: Payer: Self-pay | Admitting: Internal Medicine

## 2024-04-16 MED ORDER — ATORVASTATIN CALCIUM 80 MG PO TABS: ORAL_TABLET | ORAL | 11 refills | Status: AC

## 2024-04-17 ENCOUNTER — Ambulatory Visit: Payer: Self-pay | Admitting: Gastroenterology

## 2024-04-18 NOTE — Telephone Encounter (Signed)
 Spoke with patient . Patient states she takes Atorvastatin  every day at night , patient states she does not miss taking medication as she takes it at night and has medication  besides her bed.   Patient states she takes half a tablet at night, as prescribed in the medication bottle.   Notified patient medication has been increased to 80mg . Patient agree.   Lab appointment has been scheduled.

## 2024-04-20 MED ORDER — PANTOPRAZOLE SODIUM 20 MG PO TBEC: 20.0000 mg | DELAYED_RELEASE_TABLET | Freq: Two times a day (BID) | ORAL | 0 refills | Status: AC

## 2024-04-20 MED ORDER — TETRACYCLINE HCL 500 MG PO CAPS
500.0000 mg | ORAL_CAPSULE | Freq: Four times a day (QID) | ORAL | 0 refills | Status: DC
Start: 2024-04-20 — End: 2024-05-04

## 2024-04-20 MED ORDER — METRONIDAZOLE 250 MG PO TABS: 250.0000 mg | ORAL_TABLET | Freq: Four times a day (QID) | ORAL | 0 refills | Status: DC

## 2024-04-20 MED ORDER — BISMUTH SUBSALICYLATE 262 MG PO TABS: 2.0000 | ORAL_TABLET | Freq: Four times a day (QID) | ORAL | 0 refills | Status: AC

## 2024-04-24 ENCOUNTER — Telehealth: Payer: Self-pay | Admitting: Gastroenterology

## 2024-04-24 NOTE — Telephone Encounter (Signed)
 Hello Dr Shila,  Please see msg below from the pharmacy and advise change in medication. Thank you.

## 2024-04-24 NOTE — Telephone Encounter (Signed)
 Ok to switch Flagyl to 500 mg BID or advise patient to take 1/2 tablet of 500mg  four times daily. Substitute to equivalent dose of Bismuth that's available with pharmacy

## 2024-04-24 NOTE — Telephone Encounter (Signed)
 Inbound call from patient pharmacy stating that they are needing to know if this patient medication can be changed to something else due to them not have Flagyl 250 MG the only have 500 MG, and they don't have Bismuth 262 MG. A good call back number is [phone] ask for Arland. Please advise.

## 2024-04-25 ENCOUNTER — Other Ambulatory Visit: Payer: Self-pay

## 2024-04-25 DIAGNOSIS — A048 Other specified bacterial intestinal infections: Secondary | ICD-10-CM

## 2024-04-25 MED ORDER — METRONIDAZOLE 500 MG PO TABS: 250.0000 mg | ORAL_TABLET | Freq: Four times a day (QID) | ORAL | 0 refills | Status: AC

## 2024-04-25 NOTE — Telephone Encounter (Signed)
 Rx for Flagyl 500 mg - 1/2 tablet 4 times daily sent to pts pharmacy.

## 2024-04-26 MED ORDER — DOXYCYCLINE HYCLATE 100 MG PO CAPS: 100.0000 mg | ORAL_CAPSULE | Freq: Two times a day (BID) | ORAL | 0 refills | Status: AC

## 2024-04-26 NOTE — Telephone Encounter (Signed)
 Received a call from Arland at [facility] pharmacy. She reports that they do not stock Tetracycline or Bismuth. She confirms that they do carry Doxycycline Hyclate 100 mg. Will send per MD recommendations. Myles Arland that the pt could pick up OTC Pepto Bismol tablets. She will let the pt know this when she picks up the prescription.

## 2024-06-22 ENCOUNTER — Other Ambulatory Visit: Payer: Self-pay

## 2024-07-06 ENCOUNTER — Other Ambulatory Visit (INDEPENDENT_AMBULATORY_CARE_PROVIDER_SITE_OTHER): Payer: Self-pay

## 2024-07-06 DIAGNOSIS — E782 Mixed hyperlipidemia: Secondary | ICD-10-CM

## 2024-07-06 DIAGNOSIS — Z79899 Other long term (current) drug therapy: Secondary | ICD-10-CM

## 2024-07-07 LAB — COMPREHENSIVE METABOLIC PANEL WITH GFR
ALT: 20 IU/L (ref 0–32)
AST: 19 IU/L (ref 0–40)
Albumin: 4 g/dL (ref 3.8–4.9)
Alkaline Phosphatase: 81 IU/L (ref 49–135)
BUN/Creatinine Ratio: 14 (ref 9–23)
BUN: 10 mg/dL (ref 6–24)
Bilirubin Total: 0.4 mg/dL (ref 0.0–1.2)
CO2: 24 mmol/L (ref 20–29)
Calcium: 8.6 mg/dL — ABNORMAL LOW (ref 8.7–10.2)
Chloride: 102 mmol/L (ref 96–106)
Creatinine, Ser: 0.69 mg/dL (ref 0.57–1.00)
Globulin, Total: 2.4 g/dL (ref 1.5–4.5)
Glucose: 92 mg/dL (ref 70–99)
Potassium: 4.2 mmol/L (ref 3.5–5.2)
Sodium: 137 mmol/L (ref 134–144)
Total Protein: 6.4 g/dL (ref 6.0–8.5)
eGFR: 102 mL/min/1.73

## 2024-07-07 LAB — LIPID PANEL W/O CHOL/HDL RATIO
Cholesterol, Total: 246 mg/dL — ABNORMAL HIGH (ref 100–199)
HDL: 30 mg/dL — ABNORMAL LOW
LDL Chol Calc (NIH): 121 mg/dL — ABNORMAL HIGH (ref 0–99)
Triglycerides: 532 mg/dL — ABNORMAL HIGH (ref 0–149)
VLDL Cholesterol Cal: 95 mg/dL — ABNORMAL HIGH (ref 5–40)

## 2024-09-06 ENCOUNTER — Encounter: Payer: Self-pay | Admitting: Internal Medicine
